# Patient Record
Sex: Female | Born: 1985 | Race: Black or African American | Hispanic: No | Marital: Single | Smoking: Current every day smoker
Health system: Southern US, Community
[De-identification: ages and names within clinical notes are randomized; demographics above are authoritative.]

## PROBLEM LIST (undated history)

## (undated) DIAGNOSIS — O24419 Gestational diabetes mellitus in pregnancy, unspecified control: Secondary | ICD-10-CM

## (undated) DIAGNOSIS — A539 Syphilis, unspecified: Secondary | ICD-10-CM

## (undated) DIAGNOSIS — N390 Urinary tract infection, site not specified: Secondary | ICD-10-CM

## (undated) DIAGNOSIS — J40 Bronchitis, not specified as acute or chronic: Secondary | ICD-10-CM

## (undated) DIAGNOSIS — E669 Obesity, unspecified: Secondary | ICD-10-CM

## (undated) DIAGNOSIS — A549 Gonococcal infection, unspecified: Secondary | ICD-10-CM

## (undated) DIAGNOSIS — I10 Essential (primary) hypertension: Secondary | ICD-10-CM

## (undated) HISTORY — DX: Morbid (severe) obesity due to excess calories: E66.01

## (undated) HISTORY — PX: TONSILLECTOMY: SUR1361

## (undated) HISTORY — DX: Bronchitis, not specified as acute or chronic: J40

---
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
## 2008-10-23 ENCOUNTER — Ambulatory Visit: Payer: Self-pay | Admitting: Gynecology

## 2008-11-01 ENCOUNTER — Ambulatory Visit: Payer: Self-pay | Admitting: Gynecology

## 2008-12-18 ENCOUNTER — Emergency Department (HOSPITAL_COMMUNITY): Admission: EM | Admit: 2008-12-18 | Discharge: 2008-12-18 | Payer: Self-pay | Admitting: Emergency Medicine

## 2009-09-10 ENCOUNTER — Ambulatory Visit: Payer: Self-pay | Admitting: Gynecology

## 2009-11-18 ENCOUNTER — Ambulatory Visit: Payer: Self-pay | Admitting: Gynecology

## 2010-01-22 ENCOUNTER — Ambulatory Visit: Payer: Self-pay | Admitting: Gynecology

## 2010-04-22 ENCOUNTER — Ambulatory Visit: Payer: Self-pay | Admitting: Gynecology

## 2010-04-22 ENCOUNTER — Other Ambulatory Visit: Admission: RE | Admit: 2010-04-22 | Discharge: 2010-04-22 | Payer: Self-pay | Admitting: Gynecology

## 2010-11-24 ENCOUNTER — Emergency Department (HOSPITAL_COMMUNITY)
Admission: EM | Admit: 2010-11-24 | Discharge: 2010-11-25 | Disposition: A | Discharge status: Home / Self Care | Payer: Private Health Insurance - Indemnity | Attending: Emergency Medicine | Admitting: Emergency Medicine

## 2010-11-24 DIAGNOSIS — R21 Rash and other nonspecific skin eruption: Secondary | ICD-10-CM | POA: Insufficient documentation

## 2011-01-01 ENCOUNTER — Ambulatory Visit (INDEPENDENT_AMBULATORY_CARE_PROVIDER_SITE_OTHER): Payer: Private Health Insurance - Indemnity | Admitting: Gynecology

## 2011-01-01 DIAGNOSIS — N926 Irregular menstruation, unspecified: Secondary | ICD-10-CM

## 2011-01-01 DIAGNOSIS — N9089 Other specified noninflammatory disorders of vulva and perineum: Secondary | ICD-10-CM

## 2011-01-01 DIAGNOSIS — Z113 Encounter for screening for infections with a predominantly sexual mode of transmission: Secondary | ICD-10-CM

## 2011-01-01 DIAGNOSIS — Z3041 Encounter for surveillance of contraceptive pills: Secondary | ICD-10-CM

## 2011-05-12 ENCOUNTER — Encounter: Payer: Private Health Insurance - Indemnity | Admitting: Gynecology

## 2011-09-24 ENCOUNTER — Encounter: Payer: Private Health Insurance - Indemnity | Admitting: Gynecology

## 2011-10-14 ENCOUNTER — Encounter: Payer: Private Health Insurance - Indemnity | Admitting: Gynecology

## 2011-10-22 ENCOUNTER — Telehealth: Payer: Self-pay

## 2011-10-22 ENCOUNTER — Ambulatory Visit (INDEPENDENT_AMBULATORY_CARE_PROVIDER_SITE_OTHER): Payer: Private Health Insurance - Indemnity | Admitting: Gynecology

## 2011-10-22 ENCOUNTER — Encounter: Payer: Self-pay | Admitting: Gynecology

## 2011-10-22 ENCOUNTER — Other Ambulatory Visit (HOSPITAL_COMMUNITY)
Admission: RE | Admit: 2011-10-22 | Discharge: 2011-10-22 | Disposition: A | Discharge status: Home / Self Care | Payer: Private Health Insurance - Indemnity | Source: Ambulatory Visit | Attending: Gynecology | Admitting: Gynecology

## 2011-10-22 VITALS — BP 118/80 | Ht 65.0 in | Wt 306.0 lb

## 2011-10-22 DIAGNOSIS — Z01419 Encounter for gynecological examination (general) (routine) without abnormal findings: Secondary | ICD-10-CM

## 2011-10-22 DIAGNOSIS — Z131 Encounter for screening for diabetes mellitus: Secondary | ICD-10-CM

## 2011-10-22 DIAGNOSIS — Z113 Encounter for screening for infections with a predominantly sexual mode of transmission: Secondary | ICD-10-CM

## 2011-10-22 DIAGNOSIS — L680 Hirsutism: Secondary | ICD-10-CM

## 2011-10-22 DIAGNOSIS — Z13220 Encounter for screening for lipoid disorders: Secondary | ICD-10-CM

## 2011-10-22 LAB — URINALYSIS W MICROSCOPIC + REFLEX CULTURE
Bilirubin Urine: NEGATIVE
Casts: NONE SEEN
Ketones, ur: NEGATIVE mg/dL
Nitrite: NEGATIVE
Urobilinogen, UA: 0.2 mg/dL (ref 0.0–1.0)

## 2011-10-22 MED ORDER — SULFAMETHOXAZOLE-TRIMETHOPRIM 800-160 MG PO TABS: 1.0000 | ORAL_TABLET | Freq: Two times a day (BID) | ORAL | Status: AC

## 2011-10-22 NOTE — Progress Notes (Signed)
Addended by: Dara Lords on: 10/22/2011 03:54 PM   Modules accepted: Orders

## 2011-10-22 NOTE — Progress Notes (Addendum)
Laura Gutierrez Feb 11, 1986 [id number]        26 y.o.  for annual exam.  Doing well. She's having regular menses and never started the birth control pills as we previously discussed. She does have a history of elevated testosterone and never repeated after starting birth control pills as we had discussed previously.  Past medical history,surgical history, medications, allergies, family history and social history were all reviewed and documented in the EPIC chart. ROS:  Was performed and pertinent positives and negatives are included in the history.  Exam: Laura Gutierrez chaperone present Filed Vitals:   10/22/11 1224  BP: 118/80   General appearance  Normal Skin grossly normal with sideburn and chin hair Head/Neck normal with no cervical or supraclavicular adenopathy thyroid normal Lungs  clear Cardiac RR, without RMG Abdominal  soft, nontender, without masses, organomegaly or hernia Breasts  examined lying and sitting without masses, retractions, discharge or axillary adenopathy. Pelvic  Ext/BUS/vagina  normal   Cervix  normal  Pap, GC Chlamydia screen  Uterus  grossly normal size, shape and contour, midline and mobile nontender exam limited by abdominal girth  Adnexa  Without masses or tenderness    Anus and perineum  normal   Rectovaginal  normal sphincter tone without palpated masses or tenderness.    Assessment/Plan:  26 y.o. female for annual exam.    1. History of elevated testosterone. She had androgens drawn 2010 were testosterone was 93 her DHEAS and 17 hydroxyprogesterone were normal. She had never started oral contraceptives as directed at that point and was having irregular menses. She returned and had a rechecked July 2011 and a return 142 but again had not started oral contraceptives was going to do so but then never did. She did have an ultrasound which was normal showing a normal uterus and bilateral ovaries visualized. She now has had regular menses this past year off of oral  contraceptives and is thinking about pregnancy and does not want to start oral contraceptives. She is on multivitamin with folic acid.  Will recheck a testosterone today. 2. STD screening. Patient ask for STD screening was no known exposure risk. GC Chlamydia RPR HIV hepatitis B hepatitis C ordered. 3. Pap smear. Pap smear was done today as she is only had one in her chart which was normal. 4. Breast health. SBE monthly reviewed. 5. Cigarette smoking. Patient is not ready to stop smoking. 6. Health maintenance. Will check baseline CBC lipid profile glucose urinalysis along with her other blood work.    Dara Lords MD, 12:43 PM 10/22/2011

## 2011-10-22 NOTE — Telephone Encounter (Signed)
Patient called and asked if she could get the Rx from today called in to [facility] instead of [facility].  [facility].  The [facility] was not answering and not sure if that is weather related. I will call them again on Monday to let them know she will no be picking it up. ka

## 2011-10-22 NOTE — Patient Instructions (Signed)
Follow up for blood work results. 

## 2011-10-23 LAB — LIPID PANEL
Cholesterol: 181 mg/dL (ref 0–200)
HDL: 41 mg/dL (ref >39)
Total CHOL/HDL Ratio: 4.4 Ratio

## 2011-10-23 LAB — CBC WITH DIFFERENTIAL/PLATELET
HCT: 32.8 % — ABNORMAL LOW (ref 36.0–46.0)
Lymphocytes Relative: 40 % (ref 12–46)
MCHC: 27.7 g/dL — ABNORMAL LOW (ref 30.0–36.0)
MCV: 77.9 fL — ABNORMAL LOW (ref 78.0–100.0)
Platelets: 408 10*3/uL — ABNORMAL HIGH (ref 150–400)
RDW: 20.3 % — ABNORMAL HIGH (ref 11.5–15.5)
WBC: 7.5 10*3/uL (ref 4.0–10.5)

## 2011-10-23 LAB — RPR

## 2011-10-23 LAB — GLUCOSE, RANDOM: Glucose, Bld: 94 mg/dL (ref 70–99)

## 2011-10-23 LAB — TESTOSTERONE: Testosterone: 142.34 ng/dL — ABNORMAL HIGH (ref 10–70)

## 2011-10-25 ENCOUNTER — Encounter: Payer: Self-pay | Admitting: Gynecology

## 2011-10-26 ENCOUNTER — Other Ambulatory Visit: Payer: Self-pay | Admitting: *Deleted

## 2011-10-26 DIAGNOSIS — D649 Anemia, unspecified: Secondary | ICD-10-CM

## 2011-10-26 MED ORDER — CEFIXIME 400 MG PO TABS: 400.0000 mg | ORAL_TABLET | Freq: Every day | ORAL | Status: AC

## 2012-08-05 ENCOUNTER — Encounter (HOSPITAL_COMMUNITY): Payer: Self-pay | Admitting: Family Medicine

## 2012-08-05 ENCOUNTER — Emergency Department (HOSPITAL_COMMUNITY)
Admission: EM | Admit: 2012-08-05 | Discharge: 2012-08-05 | Disposition: A | Discharge status: Home / Self Care | Payer: Self-pay | Attending: Emergency Medicine | Admitting: Emergency Medicine

## 2012-08-05 DIAGNOSIS — H109 Unspecified conjunctivitis: Secondary | ICD-10-CM | POA: Insufficient documentation

## 2012-08-05 DIAGNOSIS — F17200 Nicotine dependence, unspecified, uncomplicated: Secondary | ICD-10-CM | POA: Insufficient documentation

## 2012-08-05 DIAGNOSIS — Z8619 Personal history of other infectious and parasitic diseases: Secondary | ICD-10-CM | POA: Insufficient documentation

## 2012-08-05 MED ORDER — GENTAMICIN SULFATE 0.3 % OP SOLN: 1.0000 [drp] | Freq: Three times a day (TID) | OPHTHALMIC | Status: DC

## 2012-08-05 NOTE — ED Notes (Signed)
Pt states she awoke with green mucus in eye, crusty, and itchy this am. "I may have hit it last night after touching a doorknob and I'm seeing a sort of rainbow thing." NAD noted. Eye is mildly red around iris.

## 2012-08-05 NOTE — ED Notes (Signed)
Per pt left eye swelling, drainage, and itching since this am.

## 2012-08-05 NOTE — ED Provider Notes (Signed)
History  Scribed for Laura Shi, MD, the patient was seen in room TR02C/TR02C. This chart was scribed by Candelaria Stagers. The patient's care started at 6:38 PM   CSN: [id number]  Arrival date & time 08/05/12  1630   First MD Initiated Contact with Patient 08/05/12 1836      Chief Complaint  Patient presents with  . Eye Problem    The history is provided by the patient. No language interpreter was used.   Laura Gutierrez is a 26 y.o. female who presents to the Emergency Department complaining of left eye swelling and drainage that started this morning.  She states that the eye is also itching.  She has tried OTC eye drops with no relief.   Past Medical History  Diagnosis Date  . History of gonorrhea 11/2009&10/2011    + GC CULTURE    Past Surgical History  Procedure Date  . Tonsillectomy     AGE 26    Family History  Problem Relation Age of Onset  . Hypertension Mother   . Alzheimer's disease Maternal Aunt   . Heart disease Maternal Grandfather   . Diabetes Paternal Grandmother     History  Substance Use Topics  . Smoking status: Current Every Day Smoker -- 0.2 packs/day  . Smokeless tobacco: Never Used  . Alcohol Use: Yes     sometimes    OB History    Grav Para Term Preterm Abortions TAB SAB Ect Mult Living   0               Review of Systems All other systems reviewed and are negative Allergies  Review of patient's allergies indicates no known allergies.  Home Medications   Current Outpatient Rx  Name Route Sig Dispense Refill  . IRON PO Oral Take by mouth. PT TAKES OTC IRON.    . GENTAMICIN SULFATE 0.3 % OP SOLN Left Eye Place 1 drop into the left eye 3 (three) times daily. 5 mL 0    BP 159/91  Pulse 96  Temp 98 F (36.7 C)  Resp 18  SpO2 100%  LMP 07/07/2012  Physical Exam  Nursing note and vitals reviewed. Constitutional: She is oriented to person, place, and time. She appears well-developed and well-nourished. No distress.  HENT:    Head: Normocephalic and atraumatic.  Eyes: EOM are normal. Pupils are equal, round, and reactive to light. Left eye exhibits discharge. Left conjunctiva is injected.  Neck: Normal range of motion.  Pulmonary/Chest: No respiratory distress.  Abdominal: Normal appearance.  Musculoskeletal: Normal range of motion.  Neurological: She is alert and oriented to person, place, and time. No cranial nerve deficit.  Skin: Skin is warm and dry. No rash noted.  Psychiatric: She has a normal mood and affect. Her behavior is normal.     ED Course  Procedures   DIAGNOSTIC STUDIES:   COORDINATION OF CARE:     Labs Reviewed - No data to display No results found.   1. Conjunctivitis       MDM  I personally performed the services described in this documentation, which was scribed in my presence. The recorded information has been reviewed and considered.       Laura Shi, MD 08/05/12 [phone]

## 2012-10-24 ENCOUNTER — Encounter: Payer: Private Health Insurance - Indemnity | Admitting: Gynecology

## 2012-11-29 ENCOUNTER — Encounter: Payer: Private Health Insurance - Indemnity | Admitting: Gynecology

## 2012-12-08 ENCOUNTER — Encounter: Payer: Private Health Insurance - Indemnity | Admitting: Gynecology

## 2013-02-12 ENCOUNTER — Inpatient Hospital Stay (HOSPITAL_COMMUNITY)
Admission: AD | Admit: 2013-02-12 | Discharge: 2013-02-12 | Disposition: A | Discharge status: Home / Self Care | Payer: BC Managed Care – PPO | Source: Ambulatory Visit | Attending: Gynecology | Admitting: Gynecology

## 2013-02-12 ENCOUNTER — Encounter (HOSPITAL_COMMUNITY): Payer: Self-pay

## 2013-02-12 DIAGNOSIS — F17200 Nicotine dependence, unspecified, uncomplicated: Secondary | ICD-10-CM | POA: Insufficient documentation

## 2013-02-12 DIAGNOSIS — L0233 Carbuncle of buttock: Secondary | ICD-10-CM | POA: Insufficient documentation

## 2013-02-12 DIAGNOSIS — L0232 Furuncle of buttock: Secondary | ICD-10-CM

## 2013-02-12 DIAGNOSIS — N920 Excessive and frequent menstruation with regular cycle: Secondary | ICD-10-CM

## 2013-02-12 DIAGNOSIS — D509 Iron deficiency anemia, unspecified: Secondary | ICD-10-CM | POA: Insufficient documentation

## 2013-02-12 DIAGNOSIS — R5381 Other malaise: Secondary | ICD-10-CM | POA: Insufficient documentation

## 2013-02-12 LAB — URINE MICROSCOPIC-ADD ON

## 2013-02-12 LAB — CBC
HCT: 27.7 % — ABNORMAL LOW (ref 36.0–46.0)
MCH: 26.2 pg (ref 26.0–34.0)
MCHC: 30.7 g/dL (ref 30.0–36.0)
RDW: 17.1 % — ABNORMAL HIGH (ref 11.5–15.5)

## 2013-02-12 LAB — URINALYSIS, ROUTINE W REFLEX MICROSCOPIC
Bilirubin Urine: NEGATIVE
Ketones, ur: NEGATIVE mg/dL
Leukocytes, UA: NEGATIVE
Nitrite: NEGATIVE
Protein, ur: NEGATIVE mg/dL

## 2013-02-12 LAB — WET PREP, GENITAL
Clue Cells Wet Prep HPF POC: NONE SEEN
Yeast Wet Prep HPF POC: NONE SEEN

## 2013-02-12 LAB — POCT PREGNANCY, URINE: Preg Test, Ur: NEGATIVE

## 2013-02-12 MED ORDER — FERROUS SULFATE 325 (65 FE) MG PO TABS: 325.0000 mg | ORAL_TABLET | Freq: Two times a day (BID) | ORAL | Status: DC

## 2013-02-12 MED ORDER — SULFAMETHOXAZOLE-TRIMETHOPRIM 800-160 MG PO TABS: 1.0000 | ORAL_TABLET | Freq: Two times a day (BID) | ORAL | Status: DC

## 2013-02-12 NOTE — MAU Note (Signed)
Patient is in with c/o vaginal bleeding since may 1st. She states that it is not heavy evry day, it actual stopped last night and restarted this morning. Patient states that she had normal lmp 01/10/13. She c/o feeling weak and boil near her rectum. Patient states that she had boils in the past that go away on its own but this current boil is painful.

## 2013-02-12 NOTE — MAU Provider Note (Signed)
CC: Vaginal Bleeding, Fatigue and Recurrent Skin Infections    First Provider Initiated Contact with Patient 02/12/13 0950      HPI Laura Gutierrez is a 27 y.o. G0P0 who presents with onset of vaginal bleeding since 02/01/2013 through present. She says the amount waxes and wanes and is sometimes heavy. It seemed to stop for the first time last night but has recurred this morning. She feels weak but not orthostatic. She has ice pica and has been on iron for anemia  (hgb 9.1 10/2011). Took 1 iron tab yesterday.  Previous menstrual period 01/10/2013 was normal. Reports regular monthly menses prior to this episode. States she has had a pelvic ultrasound years ago and has no known fibroids, PCOS or other abnormalities. Had elevated testosterone last year. No contraception.  She's also concerned that she has a painful boil near her rectum. It spontaneously drained yellow fluid.   Past Medical History  Diagnosis Date  . History of gonorrhea 11/2009&10/2011    + GC CULTURE    OB History   Grav Para Term Preterm Abortions TAB SAB Ect Mult Living   0               Past Surgical History  Procedure Laterality Date  . Tonsillectomy      AGE 73    History   Social History  . Marital Status: Single    Spouse Name: N/A    Number of Children: N/A  . Years of Education: N/A   Occupational History  . Not on file.   Social History Main Topics  . Smoking status: Current Every Day Smoker -- 0.25 packs/day    Types: Cigarettes  . Smokeless tobacco: Never Used  . Alcohol Use: Yes     Comment: sometimes  . Drug Use: No  . Sexually Active: Yes    Birth Control/ Protection: Condom   Other Topics Concern  . Not on file   Social History Narrative  . No narrative on file    No current facility-administered medications on file prior to encounter.   Current Outpatient Prescriptions on File Prior to Encounter  Medication Sig Dispense Refill  . gentamicin (GARAMYCIN) 0.3 % ophthalmic solution  Place 1 drop into the left eye 3 (three) times daily.  5 mL  0  . IRON PO Take by mouth. PT TAKES OTC IRON.        No Known Allergies  ROS Pertinent items in HPI  PHYSICAL EXAM Filed Vitals:   02/12/13 0946  BP: 137/89  Pulse: 98  Temp: 97.8 F (36.6 C)  Resp: 18   Filed Vitals:   02/12/13 1007 02/12/13 1112 02/12/13 1114 02/12/13 1116  BP:  125/74 130/75 128/84  Pulse:  81 80 94  Temp:      TempSrc:      Resp:  16 18 18   Height: 5\' 5"  (1.651 m)     Weight: 304 lb 2 oz (137.95 kg)     SpO2:        General: Well nourished, well developed female in no acute distress Cardiovascular: Normal rate Respiratory: Normal effort Abdomen: Soft, nontender Back: No CVAT Extremities: No edema Neurologic: Alert and oriented Speculum exam: NEFG; vagina with physiologic discharge, scant amount blood; cervix nulliparous,clean Bimanual exam: cervix smooth, no CMT; uterus NSSP; no adnexal tenderness or masses   LAB RESULTS Results for orders placed during the hospital encounter of 02/12/13 (from the past 24 hour(s))  URINALYSIS, ROUTINE W REFLEX MICROSCOPIC  Status: Abnormal   Collection Time    02/12/13  9:28 AM      Result Value Range   Color, Urine YELLOW  YELLOW   APPearance CLOUDY (*) CLEAR   Specific Gravity, Urine 1.025  1.005 - 1.030   pH 6.0  5.0 - 8.0   Glucose, UA NEGATIVE  NEGATIVE mg/dL   Hgb urine dipstick LARGE (*) NEGATIVE   Bilirubin Urine NEGATIVE  NEGATIVE   Ketones, ur NEGATIVE  NEGATIVE mg/dL   Protein, ur NEGATIVE  NEGATIVE mg/dL   Urobilinogen, UA 0.2  0.0 - 1.0 mg/dL   Nitrite NEGATIVE  NEGATIVE   Leukocytes, UA NEGATIVE  NEGATIVE  URINE MICROSCOPIC-ADD ON     Status: None   Collection Time    02/12/13  9:28 AM      Result Value Range   Squamous Epithelial / LPF RARE  RARE   WBC, UA 0-2  <3 WBC/hpf   RBC / HPF 21-50  <3 RBC/hpf   Bacteria, UA RARE  RARE  POCT PREGNANCY, URINE     Status: None   Collection Time    02/12/13  9:54 AM       Result Value Range   Preg Test, Ur NEGATIVE  NEGATIVE  CBC     Status: Abnormal   Collection Time    02/12/13  9:57 AM      Result Value Range   WBC 14.9 (*) 4.0 - 10.5 K/uL   RBC 3.25 (*) 3.87 - 5.11 MIL/uL   Hemoglobin 8.5 (*) 12.0 - 15.0 g/dL   HCT 91.4 (*) 78.2 - 95.6 %   MCV 85.2  78.0 - 100.0 fL   MCH 26.2  26.0 - 34.0 pg   MCHC 30.7  30.0 - 36.0 g/dL   RDW 21.3 (*) 08.6 - 57.8 %   Platelets 367  150 - 400 K/uL  WET PREP, GENITAL     Status: Abnormal   Collection Time    02/12/13 10:57 AM      Result Value Range   Yeast Wet Prep HPF POC NONE SEEN  NONE SEEN   Trich, Wet Prep FEW (*) NONE SEEN   Clue Cells Wet Prep HPF POC NONE SEEN  NONE SEEN   WBC, Wet Prep HPF POC FEW (*) NONE SEEN    IMAGING No results found.  MAU COURSE GC/CT sent C/W TC  Dr. Marcell Barlow PMD C/W Dr. Neita Carp soaks and abx for furuncle, iron supplementation  F/U  ASSESSMENT  1. Menorrhagia   2. Active smoker   3. Morbid obesity   4. Iron deficiency anemia   5. Boil of buttock     PLAN Discharge home. See AVS for patient education.    Medication List    TAKE these medications       ferrous sulfate 325 (65 FE) MG tablet  Commonly known as:  FERROUSUL  Take 1 tablet (325 mg total) by mouth 2 (two) times daily.     sulfamethoxazole-trimethoprim 800-160 MG per tablet  Commonly known as:  BACTRIM DS  Take 1 tablet by mouth 2 (two) times daily.       Follow-up Information   Schedule an appointment as soon as possible for a visit with Dara Lords, MD.   Contact information:   [address] [place] [address] [phone]      Office F/U [facility] with few trich, asymptomatic  Danae Orleans, CNM 02/12/2013 9:54 AM

## 2013-02-13 LAB — GC/CHLAMYDIA PROBE AMP: CT Probe RNA: NEGATIVE

## 2013-03-18 ENCOUNTER — Encounter (HOSPITAL_COMMUNITY): Payer: Self-pay | Admitting: Emergency Medicine

## 2013-03-18 ENCOUNTER — Emergency Department (HOSPITAL_COMMUNITY)
Admission: EM | Admit: 2013-03-18 | Discharge: 2013-03-18 | Disposition: A | Discharge status: Home / Self Care | Payer: BC Managed Care – PPO | Attending: Emergency Medicine | Admitting: Emergency Medicine

## 2013-03-18 DIAGNOSIS — Z8619 Personal history of other infectious and parasitic diseases: Secondary | ICD-10-CM | POA: Insufficient documentation

## 2013-03-18 DIAGNOSIS — T783XXA Angioneurotic edema, initial encounter: Secondary | ICD-10-CM

## 2013-03-18 DIAGNOSIS — T4995XA Adverse effect of unspecified topical agent, initial encounter: Secondary | ICD-10-CM | POA: Insufficient documentation

## 2013-03-18 DIAGNOSIS — Z79899 Other long term (current) drug therapy: Secondary | ICD-10-CM | POA: Insufficient documentation

## 2013-03-18 DIAGNOSIS — F17200 Nicotine dependence, unspecified, uncomplicated: Secondary | ICD-10-CM | POA: Insufficient documentation

## 2013-03-18 DIAGNOSIS — E669 Obesity, unspecified: Secondary | ICD-10-CM | POA: Insufficient documentation

## 2013-03-18 HISTORY — DX: Obesity, unspecified: E66.9

## 2013-03-18 MED ORDER — DIPHENHYDRAMINE HCL 25 MG PO CAPS: 25.0000 mg | ORAL_CAPSULE | Freq: Four times a day (QID) | ORAL | Status: DC | PRN

## 2013-03-18 MED ORDER — DIPHENHYDRAMINE HCL 25 MG PO CAPS
25.0000 mg | ORAL_CAPSULE | Freq: Once | ORAL | Status: AC
  Administered 2013-03-18: 25 mg via ORAL
  Filled 2013-03-18: qty 1

## 2013-03-18 MED ORDER — PREDNISONE 20 MG PO TABS
60.0000 mg | ORAL_TABLET | Freq: Once | ORAL | Status: AC
  Administered 2013-03-18: 60 mg via ORAL
  Filled 2013-03-18: qty 3

## 2013-03-18 MED ORDER — FAMOTIDINE 20 MG PO TABS
20.0000 mg | ORAL_TABLET | Freq: Once | ORAL | Status: AC
  Administered 2013-03-18: 20 mg via ORAL
  Filled 2013-03-18: qty 1

## 2013-03-18 MED ORDER — FAMOTIDINE 20 MG PO TABS: 20.0000 mg | ORAL_TABLET | Freq: Two times a day (BID) | ORAL | Status: DC

## 2013-03-18 NOTE — ED Notes (Signed)
Pt dc to home. Pt sts understanding to dc instructions. Pt ambulatory to exit without difficulty. 

## 2013-03-18 NOTE — ED Provider Notes (Signed)
History     CSN: [id number]  Arrival date & time 03/18/13  7829   First MD Initiated Contact with Patient 03/18/13 0602      Chief Complaint  Patient presents with  . Allergic Reaction    (Consider location/radiation/quality/duration/timing/severity/associated sxs/prior treatment) HPI Laura Gutierrez is a 27 y.o. female who presents to ED with complaint of lower lip swelling. States woke up in the middle of the night to use the bathroom and felt like her lip was not feeling "right." States when looked int he mirror it was swollen. States she was started on bactrim 2 days ago for an abscess which she states is healed now. States she took a dose of it just prior to going to bed. Pt denies taking any other medications. Not on any blood pressure meds. Denies any new products, facial lotions, soap, detergent. Denies taking any medications prior to coming in.   Past Medical History  Diagnosis Date  . History of gonorrhea 11/2009&10/2011    + GC CULTURE  . Obesity     Past Surgical History  Procedure Laterality Date  . Tonsillectomy      AGE 89    Family History  Problem Relation Age of Onset  . Hypertension Mother   . Alzheimer's disease Maternal Aunt   . Heart disease Maternal Grandfather   . Diabetes Paternal Grandmother     History  Substance Use Topics  . Smoking status: Current Every Day Smoker -- 0.25 packs/day    Types: Cigarettes  . Smokeless tobacco: Never Used  . Alcohol Use: Yes     Comment: sometimes    OB History   Grav Para Term Preterm Abortions TAB SAB Ect Mult Living   0               Review of Systems  HENT: Positive for facial swelling.   Respiratory: Negative.   Cardiovascular: Negative.   All other systems reviewed and are negative.    Allergies  Review of patient's allergies indicates no known allergies.  Home Medications   Current Outpatient Rx  Name  Route  Sig  Dispense  Refill  . sulfamethoxazole-trimethoprim (BACTRIM DS) 800-160  MG per tablet   Oral   Take 1 tablet by mouth 2 (two) times daily.   20 tablet   0   . ferrous sulfate (FERROUSUL) 325 (65 FE) MG tablet   Oral   Take 1 tablet (325 mg total) by mouth 2 (two) times daily.   60 tablet   1     BP 159/103  Pulse 98  Temp(Src) 98.1 F (36.7 C) (Oral)  Resp 14  SpO2 100%  LMP 03/09/2013  Physical Exam  Nursing note and vitals reviewed. Constitutional: She appears well-developed and well-nourished. No distress.  HENT:  No lip swelling. No tongue swelling. Uvula normal, midline.   Eyes: Conjunctivae are normal.  Neck: Neck supple.  Cardiovascular: Normal rate, regular rhythm and normal heart sounds.   Pulmonary/Chest: Effort normal and breath sounds normal. No respiratory distress. She has no wheezes. She has no rales.  Musculoskeletal: She exhibits no edema.  Neurological: She is alert.  Skin: Skin is warm and dry.    ED Course  Procedures (including critical care time)  No results found.   1. Allergic angioedema, initial encounter       MDM  Pt with lower lip swelling which now resolved. No tongue, uvula, throat swelling. No rash. Not on ACE inhibitors. Recently started on bactrim. Instructed to  stop. Home with pepcid and benadryl. Return if worsening.   Medications  diphenhydrAMINE (BENADRYL) capsule 25 mg (25 mg Oral Given 03/18/13 0624)  predniSONE (DELTASONE) tablet 60 mg (60 mg Oral Given 03/18/13 0624)  famotidine (PEPCID) tablet 20 mg (20 mg Oral Given 03/18/13 0624)   Filed Vitals:   03/18/13 0321 03/18/13 0641  BP: 159/103 140/87  Pulse: 98 74  Temp: 98.1 F (36.7 C)   TempSrc: Oral   Resp: 14 20  SpO2: 100% 98%           Lottie Mussel, PA-C 03/18/13 1643

## 2013-03-18 NOTE — ED Notes (Signed)
PT. WOKE UP THIS MORNING WITH LOWER LIP SWELLING , PT. IS CURRENTLY TAKING BACTRIM DS ANTIBIOTIC FOR ABSCESS ( 3RD DAY) , RESPIRATIONS UNLABORED / AIRWAY INTACT.

## 2013-03-18 NOTE — ED Provider Notes (Signed)
Medical screening examination/treatment/procedure(s) were performed by non-physician practitioner and as supervising physician I was immediately available for consultation/collaboration.  Sunnie Nielsen, MD 03/18/13 2129

## 2013-12-16 ENCOUNTER — Emergency Department (HOSPITAL_COMMUNITY): Payer: Private Health Insurance - Indemnity

## 2013-12-16 ENCOUNTER — Emergency Department (HOSPITAL_COMMUNITY)
Admission: EM | Admit: 2013-12-16 | Discharge: 2013-12-16 | Disposition: A | Discharge status: Home / Self Care | Payer: Private Health Insurance - Indemnity | Attending: Emergency Medicine | Admitting: Emergency Medicine

## 2013-12-16 ENCOUNTER — Encounter (HOSPITAL_COMMUNITY): Payer: Self-pay | Admitting: Emergency Medicine

## 2013-12-16 DIAGNOSIS — F17200 Nicotine dependence, unspecified, uncomplicated: Secondary | ICD-10-CM | POA: Insufficient documentation

## 2013-12-16 DIAGNOSIS — L03019 Cellulitis of unspecified finger: Secondary | ICD-10-CM | POA: Insufficient documentation

## 2013-12-16 MED ORDER — CEPHALEXIN 500 MG PO CAPS: 500.0000 mg | ORAL_CAPSULE | Freq: Four times a day (QID) | ORAL | Status: DC

## 2013-12-16 MED ORDER — TETANUS-DIPHTH-ACELL PERTUSSIS 5-2.5-18.5 LF-MCG/0.5 IM SUSP
0.5000 mL | Freq: Once | INTRAMUSCULAR | Status: AC
  Administered 2013-12-16: 0.5 mL via INTRAMUSCULAR
  Filled 2013-12-16: qty 0.5
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
## 2013-12-16 NOTE — ED Provider Notes (Signed)
Medical screening examination/treatment/procedure(s) were performed by non-physician practitioner and as supervising physician I was immediately available for consultation/collaboration.   EKG Interpretation None        Richardean Canalavid H Alizaya Oshea, MD 12/16/13 2108
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
## 2013-12-16 NOTE — Discharge Instructions (Signed)
Paronychia Paronychia is an inflammatory reaction involving the folds of the skin surrounding the fingernail. This is commonly caused by an infection in the skin around a nail. The most common cause of paronychia is frequent wetting of the hands (as seen with bartenders, food servers, nurses or others who wet their hands). This makes the skin around the fingernail susceptible to infection by bacteria (germs) or fungus. Other predisposing factors are:  Aggressive manicuring.  Nail biting.  Thumb sucking. The most common cause is a staphylococcal (a type of germ) infection, or a fungal (Candida) infection. When caused by a germ, it usually comes on suddenly with redness, swelling, pus and is often painful. It may get under the nail and form an abscess (collection of pus), or form an abscess around the nail. If the nail itself is infected with a fungus, the treatment is usually prolonged and may require oral medicine for up to one year. Your caregiver will determine the length of time treatment is required. The paronychia caused by bacteria (germs) may largely be avoided by not pulling on hangnails or picking at cuticles. When the infection occurs at the tips of the finger it is called felon. When the cause of paronychia is from the herpes simplex virus (HSV) it is called herpetic whitlow. TREATMENT  When an abscess is present treatment is often incision and drainage. This means that the abscess must be cut open so the pus can get out. When this is done, the following home care instructions should be followed. HOME CARE INSTRUCTIONS   It is important to keep the affected fingers very dry. Rubber or plastic gloves over cotton gloves should be used whenever the hand must be placed in water.  Keep wound clean, dry and dressed as suggested by your caregiver between warm soaks or warm compresses.  Soak in warm water for fifteen to twenty minutes three to four times per day for bacterial infections. Fungal  infections are very difficult to treat, so often require treatment for long periods of time.  For bacterial (germ) infections take antibiotics (medicine which kill germs) as directed and finish the prescription, even if the problem appears to be solved before the medicine is gone.  Only take over-the-counter or prescription medicines for pain, discomfort, or fever as directed by your caregiver. SEEK IMMEDIATE MEDICAL CARE IF:  You have redness, swelling, or increasing pain in the wound.  You notice pus coming from the wound.  You have a fever.  You notice a bad smell coming from the wound or dressing. Document Released: 03/16/2001 Document Revised: 12/13/2011 Document Reviewed: 11/15/2008 [facility] Patient Information 2014 [place], [place].  Fingertip Infection When an infection is around the nail, it is called a paronychia. When it appears over the tip of the finger, it is called a felon. These infections are due to minor injuries or cracks in the skin. If they are not treated properly, they can lead to bone infection and permanent damage to the fingernail. Incision and drainage is necessary if a pus pocket (an abscess) has formed. Antibiotics and pain medicine may also be needed. Keep your hand elevated for the next 2-3 days to reduce swelling and pain. If a pack was placed in the abscess, it should be removed in 1-2 days by your caregiver. Soak the finger in warm water for 20 minutes 4 times daily to help promote drainage. Keep the hands as dry as possible. Wear protective gloves with cotton liners. See your caregiver for follow-up care as recommended.  HOME CARE INSTRUCTIONS   Keep wound clean, dry and dressed as suggested by your caregiver.  Soak in warm salt water for fifteen minutes, four times per day for bacterial infections.  Your caregiver will prescribe an antibiotic if a bacterial infection is suspected. Take antibiotics as directed and finish the prescription, even if the  problem appears to be improving before the medicine is gone.  Only take over-the-counter or prescription medicines for pain, discomfort, or fever as directed by your caregiver. SEEK IMMEDIATE MEDICAL CARE IF:  There is redness, swelling, or increasing pain in the wound.  Pus or any other unusual drainage is coming from the wound.  An unexplained oral temperature above 102 F (38.9 C) develops.  You notice a foul smell coming from the wound or dressing. MAKE SURE YOU:   Understand these instructions.  Monitor your condition.  Contact your caregiver if you are getting worse or not improving. Document Released: 10/28/2004 Document Revised: 12/13/2011 Document Reviewed: 10/24/2008 [facility] Patient Information 2014 [place], [place].

## 2014-12-13 ENCOUNTER — Ambulatory Visit: Payer: Self-pay | Admitting: Gynecology

## 2014-12-16 ENCOUNTER — Ambulatory Visit (INDEPENDENT_AMBULATORY_CARE_PROVIDER_SITE_OTHER): Payer: PRIVATE HEALTH INSURANCE | Admitting: Gynecology

## 2014-12-16 ENCOUNTER — Encounter: Payer: Self-pay | Admitting: Gynecology

## 2014-12-16 ENCOUNTER — Other Ambulatory Visit (HOSPITAL_COMMUNITY)
Admission: RE | Admit: 2014-12-16 | Discharge: 2014-12-16 | Disposition: A | Discharge status: Home / Self Care | Payer: Private Health Insurance - Indemnity | Source: Ambulatory Visit | Attending: Gynecology | Admitting: Gynecology

## 2014-12-16 VITALS — BP 142/84 | Ht 65.5 in | Wt 294.0 lb

## 2014-12-16 DIAGNOSIS — Z113 Encounter for screening for infections with a predominantly sexual mode of transmission: Secondary | ICD-10-CM

## 2014-12-16 DIAGNOSIS — E349 Endocrine disorder, unspecified: Secondary | ICD-10-CM

## 2014-12-16 DIAGNOSIS — Z01419 Encounter for gynecological examination (general) (routine) without abnormal findings: Secondary | ICD-10-CM

## 2014-12-16 DIAGNOSIS — R7989 Other specified abnormal findings of blood chemistry: Secondary | ICD-10-CM

## 2014-12-16 LAB — CBC WITH DIFFERENTIAL/PLATELET
BASOS ABS: 0.1 10*3/uL (ref 0.0–0.1)
BASOS PCT: 1 % (ref 0–1)
EOS ABS: 0.3 10*3/uL (ref 0.0–0.7)
Eosinophils Relative: 3 % (ref 0–5)
HCT: 30.2 % — ABNORMAL LOW (ref 36.0–46.0)
Hemoglobin: 8.4 g/dL — ABNORMAL LOW (ref 12.0–15.0)
Lymphocytes Relative: 36 % (ref 12–46)
Lymphs Abs: 3.1 10*3/uL (ref 0.7–4.0)
MCH: 20.2 pg — ABNORMAL LOW (ref 26.0–34.0)
MCHC: 27.8 g/dL — AB (ref 30.0–36.0)
MCV: 72.8 fL — AB (ref 78.0–100.0)
MPV: 11 fL (ref 8.6–12.4)
Monocytes Absolute: 1 10*3/uL (ref 0.1–1.0)
Monocytes Relative: 11 % (ref 3–12)
NEUTROS ABS: 4.3 10*3/uL (ref 1.7–7.7)
Neutrophils Relative %: 49 % (ref 43–77)
PLATELETS: 367 10*3/uL (ref 150–400)
RBC: 4.15 MIL/uL (ref 3.87–5.11)
RDW: 18.1 % — AB (ref 11.5–15.5)
WBC: 8.7 10*3/uL (ref 4.0–10.5)

## 2014-12-16 NOTE — Addendum Note (Signed)
Addended by: Dayna BarkerGARDNER, KIMBERLY K on: 12/16/2014 11:41 AM   Modules accepted: Orders

## 2014-12-16 NOTE — Progress Notes (Signed)
Laura Gutierrez 02/15/1986 [id number]        29 y.o.  G0P0 for annual exam.  Has not been in for over 3 years. Several issues noted below.  Past medical history,surgical history, problem list, medications, allergies, family history and social history were all reviewed and documented as reviewed in the EPIC chart.  ROS:  Performed with pertinent positives and negatives included in the history, assessment and plan.   Additional significant findings :  none   Exam: [facility] person[facility]istant Filed Vitals:   12/16/14 1050  BP: 142/84  Height: 5' 5.5" (1.664 m)  Weight: 294 lb (133.358 kg)   General appearance:  Normal affect, orientation and appearance. Skin: Grossly normal with mild chin hairs. HEENT: Without gross lesions.  No cervical or supraclavicular adenopathy. Thyroid normal.  Lungs:  Clear without wheezing, rales or rhonchi Cardiac: RR, without RMG Abdominal:  Soft, nontender, without masses, guarding, rebound, organomegaly or hernia Breasts:  Examined lying and sitting without masses, retractions, discharge or axillary adenopathy. Pelvic:  Ext/BUS/vagina normal  Cervix normal. GC/Chlamydia area Pap smear  Uterus difficult to palpate but grossly normal size, nontender   Adnexa  Without gross masses or tenderness    Anus and perineum  Normal    Assessment/Plan:  29 y.o. G0P0 female for annual exam with regular menses, no birth control.   1. History of elevated testosterone. 2010 testosterone was 93. DHEAS and 17 hydroxy progesterone normal. Follow up testosterone 2011 142. The patient was to start oral contraceptives but never did. Not having reportedly significant hirsutism. Having regular menses.  Not interested in starting oral contraceptives now. Recheck testosterone DHEAS and 17 hydroxyprogesterone.  Check baseline ultrasound for pelvic surveillance. 2. Elevated blood pressure.  Blood pressure 142/84. Review of her chart from ER visits that it has also been elevated. Strongly recommended  patient follow up with primary care physician for monitoring and treatment as needed. We will help facilitate this appointment. No routine blood work done as we will let them decide and do what they feel appropriate. 3. Birth control. Patient not using birth control. Danger of getting pregnant with uncontrolled elevated blood pressure reviewed. Strongly recommended patient use contraception now. Offered options to include combination, progesterone only, IUD. At this point do not feel starting the pill appropriate until her blood pressures controlled. Patient does not want to start anything regardless. She understands the risks of pregnancy practically with her elevated blood pressure. 4. History of GC in 2011 and 2013. GC/Chlamydia done today. Serum screening with hepatitis B, hepatitis C, RPR and HIV done. 5. Pap smear 2013 normal. Pap smear done today a 3 year interval per current screening guidelines. 6. Breast health. SBE monthly reviewed. 7. Stop smoking and encouraged. 8. Obesity. Will check baseline ultrasound for pelvic surveillance. 9. Health maintenance. No routine blood work done as she will have this done at her primary physician's office when establishing care. I did check a baseline CBC. She is known to be anemic in the past and never followed up to take iron as recommended. I again encouraged her to start taking daily iron supplements. Follow up for ultrasound and lab work results.     Dara LordsFONTAINE,Khayla Koppenhaver P MD, 11:25 AM 12/16/2014

## 2014-12-16 NOTE — Patient Instructions (Signed)
Follow up for lab results and ultrasound as scheduled.  Office will call you to help arrange internal medicine follow up appointment  You may obtain a copy of any labs that were done today by logging onto MyChart as outlined in the instructions provided with your AVS (after visit summary). The office will not call with normal lab results but certainly if there are any significant abnormalities then we will contact you.   Health Maintenance, Female A healthy lifestyle and preventative care can promote health and wellness.  Maintain regular health, dental, and eye exams.  Eat a healthy diet. Foods like vegetables, fruits, whole grains, low-fat dairy products, and lean protein foods contain the nutrients you need without too many calories. Decrease your intake of foods high in solid fats, added sugars, and salt. Get information about a proper diet from your caregiver, if necessary.  Regular physical exercise is one of the most important things you can do for your health. Most adults should get at least 150 minutes of moderate-intensity exercise (any activity that increases your heart rate and causes you to sweat) each week. In addition, most adults need muscle-strengthening exercises on 2 or more days a week.   Maintain a healthy weight. The body mass index (BMI) is a screening tool to identify possible weight problems. It provides an estimate of body fat based on height and weight. Your caregiver can help determine your BMI, and can help you achieve or maintain a healthy weight. For adults 20 years and older:  A BMI below 18.5 is considered underweight.  A BMI of 18.5 to 24.9 is normal.  A BMI of 25 to 29.9 is considered overweight.  A BMI of 30 and above is considered obese.  Maintain normal blood lipids and cholesterol by exercising and minimizing your intake of saturated fat. Eat a balanced diet with plenty of fruits and vegetables. Blood tests for lipids and cholesterol should begin at  age 68 and be repeated every 5 years. If your lipid or cholesterol levels are high, you are over 50, or you are a high risk for heart disease, you may need your cholesterol levels checked more frequently.Ongoing high lipid and cholesterol levels should be treated with medicines if diet and exercise are not effective.  If you smoke, find out from your caregiver how to quit. If you do not use tobacco, do not start.  Lung cancer screening is recommended for adults aged 65 80 years who are at high risk for developing lung cancer because of a history of smoking. Yearly low-dose computed tomography (CT) is recommended for people who have at least a 30-pack-year history of smoking and are a current smoker or have quit within the past 15 years. A pack year of smoking is smoking an average of 1 pack of cigarettes a day for 1 year (for example: 1 pack a day for 30 years or 2 packs a day for 15 years). Yearly screening should continue until the smoker has stopped smoking for at least 15 years. Yearly screening should also be stopped for people who develop a health problem that would prevent them from having lung cancer treatment.  If you are pregnant, do not drink alcohol. If you are breastfeeding, be very cautious about drinking alcohol. If you are not pregnant and choose to drink alcohol, do not exceed 1 drink per day. One drink is considered to be 12 ounces (355 mL) of beer, 5 ounces (148 mL) of wine, or 1.5 ounces (44 mL) of liquor.  Avoid use of street drugs. Do not share needles with anyone. Ask for help if you need support or instructions about stopping the use of drugs.  High blood pressure causes heart disease and increases the risk of stroke. Blood pressure should be checked at least every 1 to 2 years. Ongoing high blood pressure should be treated with medicines, if weight loss and exercise are not effective.  If you are 36 to 27 years old, ask your caregiver if you should take aspirin to prevent  strokes.  Diabetes screening involves taking a blood sample to check your fasting blood sugar level. This should be done once every 3 years, after age 30, if you are within normal weight and without risk factors for diabetes. Testing should be considered at a younger age or be carried out more frequently if you are overweight and have at least 1 risk factor for diabetes.  Breast cancer screening is essential preventative care for women. You should practice "breast self-awareness." This means understanding the normal appearance and feel of your breasts and may include breast self-examination. Any changes detected, no matter how small, should be reported to a caregiver. Women in their 63s and 30s should have a clinical breast exam (CBE) by a caregiver as part of a regular health exam every 1 to 3 years. After age 46, women should have a CBE every year. Starting at age 41, women should consider having a mammogram (breast X-ray) every year. Women who have a family history of breast cancer should talk to their caregiver about genetic screening. Women at a high risk of breast cancer should talk to their caregiver about having an MRI and a mammogram every year.  Breast cancer gene (BRCA)-related cancer risk assessment is recommended for women who have family members with BRCA-related cancers. BRCA-related cancers include breast, ovarian, tubal, and peritoneal cancers. Having family members with these cancers may be associated with an increased risk for harmful changes (mutations) in the breast cancer genes BRCA1 and BRCA2. Results of the assessment will determine the need for genetic counseling and BRCA1 and BRCA2 testing.  The Pap test is a screening test for cervical cancer. Women should have a Pap test starting at age 64. Between ages 70 and 95, Pap tests should be repeated every 2 years. Beginning at age 71, you should have a Pap test every 3 years as long as the past 3 Pap tests have been normal. If you had a  hysterectomy for a problem that was not cancer or a condition that could lead to cancer, then you no longer need Pap tests. If you are between ages 56 and 49, and you have had normal Pap tests going back 10 years, you no longer need Pap tests. If you have had past treatment for cervical cancer or a condition that could lead to cancer, you need Pap tests and screening for cancer for at least 20 years after your treatment. If Pap tests have been discontinued, risk factors (such as a new sexual partner) need to be reassessed to determine if screening should be resumed. Some women have medical problems that increase the chance of getting cervical cancer. In these cases, your caregiver may recommend more frequent screening and Pap tests.  The human papillomavirus (HPV) test is an additional test that may be used for cervical cancer screening. The HPV test looks for the virus that can cause the cell changes on the cervix. The cells collected during the Pap test can be tested for HPV. The  HPV test could be used to screen women aged 60 years and older, and should be used in women of any age who have unclear Pap test results. After the age of 62, women should have HPV testing at the same frequency as a Pap test.  Colorectal cancer can be detected and often prevented. Most routine colorectal cancer screening begins at the age of 55 and continues through age 35. However, your caregiver may recommend screening at an earlier age if you have risk factors for colon cancer. On a yearly basis, your caregiver may provide home test kits to check for hidden blood in the stool. Use of a small camera at the end of a tube, to directly examine the colon (sigmoidoscopy or colonoscopy), can detect the earliest forms of colorectal cancer. Talk to your caregiver about this at age 74, when routine screening begins. Direct examination of the colon should be repeated every 5 to 10 years through age 34, unless early forms of pre-cancerous  polyps or small growths are found.  Hepatitis C blood testing is recommended for all people born from [birth year] through 1963 and any individual with known risks for hepatitis C.  Practice safe sex. Use condoms and avoid high-risk sexual practices to reduce the spread of sexually transmitted infections (STIs). Sexually active women aged 39 and younger should be checked for Chlamydia, which is a common sexually transmitted infection. Older women with new or multiple partners should also be tested for Chlamydia. Testing for other STIs is recommended if you are sexually active and at increased risk.  Osteoporosis is a disease in which the bones lose minerals and strength with aging. This can result in serious bone fractures. The risk of osteoporosis can be identified using a bone density scan. Women ages 53 and over and women at risk for fractures or osteoporosis should discuss screening with their caregivers. Ask your caregiver whether you should be taking a calcium supplement or vitamin D to reduce the rate of osteoporosis.  Menopause can be associated with physical symptoms and risks. Hormone replacement therapy is available to decrease symptoms and risks. You should talk to your caregiver about whether hormone replacement therapy is right for you.  Use sunscreen. Apply sunscreen liberally and repeatedly throughout the day. You should seek shade when your shadow is shorter than you. Protect yourself by wearing long sleeves, pants, a wide-brimmed hat, and sunglasses year round, whenever you are outdoors.  Notify your caregiver of new moles or changes in moles, especially if there is a change in shape or color. Also notify your caregiver if a mole is larger than the size of a pencil eraser.  Stay current with your immunizations. Document Released: 04/05/2011 Document Revised: 01/15/2013 Document Reviewed: 04/05/2011 [facility] Patient Information 2014 [place].

## 2014-12-17 LAB — GC/CHLAMYDIA PROBE AMP
CT Probe RNA: NEGATIVE
GC Probe RNA: NEGATIVE

## 2014-12-17 LAB — DHEA-SULFATE: DHEA-SO4: 85 ug/dL (ref 18–391)

## 2014-12-17 LAB — HEPATITIS C ANTIBODY: HCV Ab: NEGATIVE

## 2014-12-17 LAB — RPR

## 2014-12-17 LAB — HIV ANTIBODY (ROUTINE TESTING W REFLEX): HIV 1&2 Ab, 4th Generation: NONREACTIVE

## 2014-12-17 LAB — TESTOSTERONE: Testosterone: 108 ng/dL — ABNORMAL HIGH (ref 10–70)

## 2014-12-17 LAB — HEPATITIS B SURFACE ANTIGEN: Hepatitis B Surface Ag: NEGATIVE

## 2014-12-17 LAB — CYTOLOGY - PAP

## 2014-12-19 LAB — 17-HYDROXYPROGESTERONE: 17-OH-Progesterone, LC/MS/MS: 236 ng/dL

## 2014-12-20 ENCOUNTER — Other Ambulatory Visit: Payer: Self-pay | Admitting: Gynecology

## 2014-12-20 DIAGNOSIS — D508 Other iron deficiency anemias: Secondary | ICD-10-CM

## 2014-12-20 DIAGNOSIS — R899 Unspecified abnormal finding in specimens from other organs, systems and tissues: Secondary | ICD-10-CM

## 2014-12-31 ENCOUNTER — Ambulatory Visit: Payer: PRIVATE HEALTH INSURANCE | Admitting: Gynecology

## 2014-12-31 ENCOUNTER — Other Ambulatory Visit: Payer: PRIVATE HEALTH INSURANCE

## 2015-01-01 ENCOUNTER — Encounter: Payer: Self-pay | Admitting: Gynecology

## 2015-01-13 ENCOUNTER — Other Ambulatory Visit: Payer: PRIVATE HEALTH INSURANCE

## 2015-01-13 ENCOUNTER — Ambulatory Visit: Payer: PRIVATE HEALTH INSURANCE | Admitting: Gynecology

## 2015-05-09 ENCOUNTER — Inpatient Hospital Stay (HOSPITAL_COMMUNITY)
Admission: AD | Admit: 2015-05-09 | Discharge: 2015-05-09 | Disposition: A | Discharge status: Home / Self Care | Payer: Private Health Insurance - Indemnity | Source: Ambulatory Visit | Attending: Gynecology | Admitting: Gynecology

## 2015-05-09 ENCOUNTER — Encounter (HOSPITAL_COMMUNITY): Payer: Self-pay | Admitting: *Deleted

## 2015-05-09 DIAGNOSIS — Z882 Allergy status to sulfonamides status: Secondary | ICD-10-CM | POA: Insufficient documentation

## 2015-05-09 DIAGNOSIS — N9089 Other specified noninflammatory disorders of vulva and perineum: Secondary | ICD-10-CM | POA: Diagnosis not present

## 2015-05-09 DIAGNOSIS — F17210 Nicotine dependence, cigarettes, uncomplicated: Secondary | ICD-10-CM | POA: Insufficient documentation

## 2015-05-09 LAB — GC/CHLAMYDIA PROBE AMP (~~LOC~~) NOT AT ARMC
CHLAMYDIA, DNA PROBE: NEGATIVE
NEISSERIA GONORRHEA: NEGATIVE

## 2015-05-09 LAB — URINALYSIS, ROUTINE W REFLEX MICROSCOPIC
BILIRUBIN URINE: NEGATIVE
GLUCOSE, UA: NEGATIVE mg/dL
Ketones, ur: NEGATIVE mg/dL
Nitrite: NEGATIVE
PH: 6 (ref 5.0–8.0)
PROTEIN: NEGATIVE mg/dL
Specific Gravity, Urine: >1.03 — ABNORMAL HIGH (ref 1.005–1.030)
UROBILINOGEN UA: 0.2 mg/dL (ref 0.0–1.0)

## 2015-05-09 LAB — POCT PREGNANCY, URINE: PREG TEST UR: NEGATIVE

## 2015-05-09 LAB — URINE MICROSCOPIC-ADD ON

## 2015-05-09 LAB — WET PREP, GENITAL
Clue Cells Wet Prep HPF POC: NONE SEEN
Trich, Wet Prep: NONE SEEN
Yeast Wet Prep HPF POC: NONE SEEN

## 2015-05-09 MED ORDER — FLUCONAZOLE 150 MG PO TABS: 150.0000 mg | ORAL_TABLET | Freq: Once | ORAL | Status: DC

## 2015-05-09 NOTE — MAU Note (Signed)
PT SAYS SHE HAS VAG IRRITATION-   HAS INCREASE FREQ  WITH VOIDING - STARTED ON Monday .  DENIES VAG D/C  - NO ODOR      STARTED 7 DAY TX - MONISTAT-  HAS BECOME  WORSE.    NO BIRTH CONTROL.  LAST SEX-  Friday

## 2015-05-09 NOTE — Discharge Instructions (Signed)

## 2015-05-09 NOTE — MAU Note (Signed)
Pt states she has had vaginal irritation for the last 5 days, used OTC yeast cream with no relief.

## 2015-05-09 NOTE — MAU Provider Note (Signed)
History     CSN: [id number]  Arrival date and time: 05/09/15 0425   First Provider Initiated Contact with Patient 05/09/15 [phone]      No chief complaint on file.  HPI Comments: Laura Gutierrez is a 29 y.o. G0P0 who presents today with vulvar irritation. She states that it started on Monday. She started using an OTC yeast treatment on Tuesday. She feels more irritated since she started the treatment. She rates her pain 6/10.   Vaginal Discharge The patient's primary symptoms include vaginal discharge. This is a new problem. Episode onset: Since Monday  The problem occurs constantly. The problem has been unchanged. The pain is mild. She is not pregnant. Associated symptoms include frequency. Pertinent negatives include no abdominal pain, constipation, diarrhea, dysuria, fever, nausea, urgency or vomiting. The vaginal discharge was normal. There has been no bleeding. She has tried antifungals (has done 3 days of a 7 day treatment ) for the symptoms. The treatment provided no relief (patient feels like she has been more irritated after using the OTC yeast treatment. ). She is sexually active. It is possible that her partner has an STD. She uses nothing for contraception.     Past Medical History  Diagnosis Date  . History of gonorrhea 11/2009&10/2011    + GC CULTURE  . Obesity   . STD (sexually transmitted disease)     Gonorrhea Hx    Past Surgical History  Procedure Laterality Date  . Tonsillectomy      AGE 53    Family History  Problem Relation Age of Onset  . Hypertension Mother   . Alzheimer's disease Maternal Aunt   . Heart disease Maternal Grandfather   . Diabetes Paternal Grandmother     History  Substance Use Topics  . Smoking status: Current Every Day Smoker -- 0.25 packs/day    Types: Cigarettes  . Smokeless tobacco: Never Used  . Alcohol Use: 0.0 oz/week    0 Standard drinks or equivalent per week     Comment: sometimes   LAST DRANK  - 05-03-2015    Allergies:   Allergies  Allergen Reactions  . Bactrim [Sulfamethoxazole-Trimethoprim] Anaphylaxis    Prescriptions prior to admission  Medication Sig Dispense Refill Last Dose  . acetaminophen (TYLENOL) 500 MG tablet Take 1,000 mg by mouth every 6 (six) hours as needed for headache.   Past Week at Unknown time  . ibuprofen (ADVIL,MOTRIN) 200 MG tablet Take 800 mg by mouth every 6 (six) hours as needed for moderate pain.   More than a month at Unknown time    Review of Systems  Constitutional: Negative for fever.  Gastrointestinal: Negative for nausea, vomiting, abdominal pain, diarrhea and constipation.  Genitourinary: Positive for frequency and vaginal discharge. Negative for dysuria and urgency.   Physical Exam   Blood pressure 149/98, pulse 104, temperature 98.4 F (36.9 C), temperature source Oral, resp. rate 18, height  (1.651 m), weight 134.718 kg (297 lb), last menstrual period 04/25/2015, SpO2 99 %.  Physical Exam  Nursing note and vitals reviewed. Constitutional: She appears well-developed and well-nourished. No distress.  HENT:  Head: Normocephalic.  Cardiovascular: Normal rate.   Respiratory: Effort normal.  GI: Soft. There is no tenderness.  Genitourinary:  No redness or irritation visible externally. Patient unable to tolerate speculum exam Wet prep collected with blind sweep. No visible discharge externally. Patient also reports that she used yeast infection cream last night.   Neurological: She is alert.  Skin: Skin is warm and  dry.  Psychiatric: She has a normal mood and affect.   Results for orders placed or performed during the hospital encounter of 05/09/15 (from the past 24 hour(s))  Urinalysis, Routine w reflex microscopic (not at [facility])     Status: Abnormal   Collection Time: 05/09/15  4:35 AM  Result Value Ref Range   Color, Urine YELLOW YELLOW   APPearance HAZY (A) CLEAR   Specific Gravity, Urine >1.030 (H) 1.005 - 1.030   pH 6.0 5.0 - 8.0   Glucose, UA  NEGATIVE NEGATIVE mg/dL   Hgb urine dipstick TRACE (A) NEGATIVE   Bilirubin Urine NEGATIVE NEGATIVE   Ketones, ur NEGATIVE NEGATIVE mg/dL   Protein, ur NEGATIVE NEGATIVE mg/dL   Urobilinogen, UA 0.2 0.0 - 1.0 mg/dL   Nitrite NEGATIVE NEGATIVE   Leukocytes, UA SMALL (A) NEGATIVE  Urine microscopic-add on     Status: Abnormal   Collection Time: 05/09/15  4:35 AM  Result Value Ref Range   Squamous Epithelial / LPF MANY (A) RARE   WBC, UA 3-6 <3 WBC/hpf   RBC / HPF 0-2 <3 RBC/hpf   Bacteria, UA MANY (A) RARE  Pregnancy, urine POC     Status: None   Collection Time: 05/09/15  4:45 AM  Result Value Ref Range   Preg Test, Ur NEGATIVE NEGATIVE  Wet prep, genital     Status: Abnormal   Collection Time: 05/09/15  6:05 AM  Result Value Ref Range   Yeast Wet Prep HPF POC NONE SEEN NONE SEEN   Trich, Wet Prep NONE SEEN NONE SEEN   Clue Cells Wet Prep HPF POC NONE SEEN NONE SEEN   WBC, Wet Prep HPF POC FEW (A) NONE SEEN    MAU Course  Procedures  MDM Unsure if wet prep sample was adequate 2/2 use of yeast infection cream. Will treat with diflucan and FU with OBGYN if sx worsen or persist.   Assessment and Plan   1. Vulvar irritation    DC home Comfort measures reviewed  Stop OTC yeast treatment RX: diflucan #2  Return to MAU as needed FU with GYN  as planned  Follow-up Information    Follow up with Dara Lords, MD.   Specialties:  Gynecology, Radiology   Why:  If symptoms worsen   Contact information:   [address] [place] [address] [place] [address] [phone]         Tawnya Crook 05/09/2015, 6:28 AM

## 2015-05-11 LAB — URINE CULTURE

## 2015-05-12 ENCOUNTER — Telehealth: Payer: Self-pay | Admitting: Advanced Practice Midwife

## 2015-05-12 MED ORDER — AMOXICILLIN 500 MG PO CAPS: 500.0000 mg | ORAL_CAPSULE | Freq: Three times a day (TID) | ORAL | Status: DC

## 2015-05-12 NOTE — Telephone Encounter (Signed)
Called pt to notify her of positive urine culture.  Amoxicillin 500 mg TID x 7 days sent to [facility] on [address]  Pt states understanding.

## 2016-12-17 ENCOUNTER — Ambulatory Visit (INDEPENDENT_AMBULATORY_CARE_PROVIDER_SITE_OTHER): Payer: Self-pay | Admitting: Physician Assistant

## 2016-12-17 ENCOUNTER — Encounter (INDEPENDENT_AMBULATORY_CARE_PROVIDER_SITE_OTHER): Payer: Self-pay | Admitting: Physician Assistant

## 2016-12-17 ENCOUNTER — Other Ambulatory Visit (HOSPITAL_COMMUNITY)
Admission: RE | Admit: 2016-12-17 | Discharge: 2016-12-17 | Disposition: A | Discharge status: Home / Self Care | Payer: Self-pay | Source: Ambulatory Visit | Attending: Physician Assistant | Admitting: Physician Assistant

## 2016-12-17 VITALS — BP 154/107 | HR 99 | Temp 98.5°F | Ht 66.0 in | Wt 323.2 lb

## 2016-12-17 DIAGNOSIS — R0982 Postnasal drip: Secondary | ICD-10-CM

## 2016-12-17 DIAGNOSIS — N390 Urinary tract infection, site not specified: Secondary | ICD-10-CM

## 2016-12-17 DIAGNOSIS — R8281 Pyuria: Secondary | ICD-10-CM

## 2016-12-17 DIAGNOSIS — Z7251 High risk heterosexual behavior: Secondary | ICD-10-CM

## 2016-12-17 DIAGNOSIS — Z113 Encounter for screening for infections with a predominantly sexual mode of transmission: Secondary | ICD-10-CM | POA: Insufficient documentation

## 2016-12-17 LAB — POCT URINALYSIS DIPSTICK
Bilirubin, UA: NEGATIVE
Glucose, UA: NEGATIVE
KETONES UA: NEGATIVE
NITRITE UA: NEGATIVE
PH UA: 6.5 (ref 5.0–8.0)
Protein, UA: NEGATIVE
RBC UA: NEGATIVE
Spec Grav, UA: 1.025 (ref 1.030–1.035)
Urobilinogen, UA: 0.2 (ref ?–2.0)

## 2016-12-17 LAB — POCT URINE PREGNANCY: Preg Test, Ur: NEGATIVE

## 2016-12-17 MED ORDER — DIPHENHYDRAMINE HCL 25 MG PO TABS: 25.0000 mg | ORAL_TABLET | Freq: Four times a day (QID) | ORAL | 0 refills | Status: DC | PRN

## 2016-12-17 NOTE — Patient Instructions (Signed)
Safe Sex Practicing safe sex means taking steps before and during sex to reduce your risk of:  Getting an STD (sexually transmitted disease).  Giving your partner an STD.  Unwanted pregnancy. How can I practice safe sex?   To practice safe sex:  Limit your sexual partners to only one partner who is having sex with only you.  Avoid using alcohol and recreational drugs before having sex. These substances can affect your judgment.  Before having sex with a new partner:  Talk to your partner about past partners, past STDs, and drug use.  You and your partner should be screened for STDs and discuss the results with each other.  Check your body regularly for sores, blisters, rashes, or unusual discharge. If you notice any of these problems, visit your health care provider.  If you have symptoms of an infection or you are being treated for an STD, avoid sexual contact.  While having sex, use a condom. Make sure to:  Use a condom every time you have vaginal, oral, or anal sex. Both females and males should wear condoms during oral sex.  Keep condoms in place from the beginning to the end of sexual activity.  Use a latex condom, if possible. Latex condoms offer the best protection.  Use only water-based lubricants or oils to lubricate a condom. Using petroleum-based lubricants or oils will weaken the condom and increase the chance that it will break.  See your health care provider for regular screenings, exams, and tests for STDs.  Talk with your health care provider about the form of birth control (contraception) that is best for you.  Get vaccinated against hepatitis B and human papillomavirus (HPV).  If you are at risk of being infected with HIV (human immunodeficiency virus), talk with your health care provider about taking a prescription medicine to prevent HIV infection. You are considered at risk for HIV if:  You are a man who has sex with other men.  You are a  heterosexual man or woman who is sexually active with more than one partner.  You take drugs by injection.  You are sexually active with a partner who has HIV. This information is not intended to replace advice given to you by your health care provider. Make sure you discuss any questions you have with your health care provider. Document Released: 10/28/2004 Document Revised: 02/04/2016 Document Reviewed: 08/10/2015 Elsevier Interactive Patient Education  2017 Elsevier Inc.  

## 2016-12-17 NOTE — Progress Notes (Signed)
Subjective:  Patient ID: Laura Gutierrez, female    DOB: 11/20/1985  Age: 31 y.o. MRN: [id number]  CC: cough, STI screening  HPI Laura Gutierrez is a 31 y.o. female with a medical history of obesity presents with cough x 2-3 weeks. She was previously ill with a cold but is concerned that she still has a cough. Malaise, fever, chills, and fatigue have resolved.     She is requested an STI screening. Has two sexual partners and has unprotected sex with both of them. Denies genital lesions, vaginal discharge, vaginal itching/burning, dysuria, abdominal pain, fever, chills, nausea,    Outpatient Medications Prior to Visit  Medication Sig Dispense Refill  . acetaminophen (TYLENOL) 500 MG tablet Take 1,000 mg by mouth every 6 (six) hours as needed for headache.    . ibuprofen (ADVIL,MOTRIN) 200 MG tablet Take 800 mg by mouth every 6 (six) hours as needed for moderate pain.    Marland Kitchen. amoxicillin (AMOXIL) 500 MG capsule Take 1 capsule (500 mg total) by mouth 3 (three) times daily. 21 capsule 0  . fluconazole (DIFLUCAN) 150 MG tablet Take 1 tablet (150 mg total) by mouth once. May repeat in 2 days if still having symptoms. 2 tablet 0   No facility-administered medications prior to visit.      ROS Review of Systems  Constitutional: Negative for chills, fever and malaise/fatigue.  Eyes: Negative for blurred vision.  Respiratory: Positive for cough. Negative for shortness of breath.   Cardiovascular: Negative for chest pain and palpitations.  Gastrointestinal: Negative for abdominal pain and nausea.  Genitourinary: Negative for dysuria, flank pain, frequency, hematuria and urgency.  Musculoskeletal: Negative for joint pain and myalgias.  Skin: Negative for rash.  Neurological: Negative for tingling and headaches.  Psychiatric/Behavioral: Negative for depression. The patient is not nervous/anxious.     Objective:  BP (!) 154/107   Pulse 99   Temp 98.5 F (36.9 C) (Oral)   Ht 5\' 6"  (1.676 m)   Wt  (!) 323 lb 3.2 oz (146.6 kg)   LMP 11/23/2016 (Exact Date)   SpO2 97%   BMI 52.17 kg/m   BP/Weight 12/17/2016 05/09/2015 12/16/2014  Systolic BP 154 162 142  Diastolic BP 107 85 84  Wt. (Lbs) 323.2 297 294  BMI 52.17 49.42 48.16      Physical Exam  Constitutional: She is oriented to person, place, and time.  Obese, NAD, polite  HENT:  Head: Normocephalic and atraumatic.  Eyes: No scleral icterus.  Neck: Normal range of motion. Neck supple.  Cardiovascular: Normal rate, regular rhythm and normal heart sounds.   Pulmonary/Chest: Effort normal and breath sounds normal.  Musculoskeletal: She exhibits no edema.  Neurological: She is alert and oriented to person, place, and time.  Skin: Skin is warm and dry.  Psychiatric: She has a normal mood and affect. Her behavior is normal. Thought content normal.  Poor judgment in regards to sexual behavior     Assessment & Plan:   1. Routine screening for STI (sexually transmitted infection) - Urinalysis Dipstick - RPR - HIV antibody - Urine cytology ancillary only  2. Postnasal discharge -Benadryl 25 mg qhs   3. Pyuria - Urine culture - Pt asymptomatic  Meds ordered this encounter  Medications  . diphenhydrAMINE (BENADRYL) 25 MG tablet    Sig: Take 1 tablet (25 mg total) by mouth every 6 (six) hours as needed.    Dispense:  30 tablet    Refill:  0    Order Specific  Question:   Supervising Provider    Answer:   Quentin Angst [[id number]]    Follow-up: Return in about 4 weeks (around 01/14/2017) for physical exam.   Loletta Specter PA

## 2016-12-18 LAB — HIV ANTIBODY (ROUTINE TESTING W REFLEX): HIV SCREEN 4TH GENERATION: NONREACTIVE

## 2016-12-18 LAB — URINE CULTURE

## 2016-12-18 LAB — RPR: RPR Ser Ql: NONREACTIVE

## 2016-12-23 ENCOUNTER — Ambulatory Visit (INDEPENDENT_AMBULATORY_CARE_PROVIDER_SITE_OTHER): Payer: Self-pay | Admitting: Physician Assistant

## 2016-12-23 ENCOUNTER — Encounter (INDEPENDENT_AMBULATORY_CARE_PROVIDER_SITE_OTHER): Payer: Self-pay | Admitting: Physician Assistant

## 2016-12-23 VITALS — BP 140/97 | HR 97 | Temp 98.2°F | Ht 66.0 in | Wt 321.6 lb

## 2016-12-23 DIAGNOSIS — I10 Essential (primary) hypertension: Secondary | ICD-10-CM

## 2016-12-23 DIAGNOSIS — E669 Obesity, unspecified: Secondary | ICD-10-CM

## 2016-12-23 LAB — URINE CYTOLOGY ANCILLARY ONLY: Candida vaginitis: NEGATIVE

## 2016-12-23 MED ORDER — HYDROCHLOROTHIAZIDE 12.5 MG PO TABS: 12.5000 mg | ORAL_TABLET | Freq: Every day | ORAL | 1 refills | Status: DC

## 2016-12-23 NOTE — Patient Instructions (Signed)
DASH Eating Plan DASH stands for "Dietary Approaches to Stop Hypertension." The DASH eating plan is a healthy eating plan that has been shown to reduce high blood pressure (hypertension). It may also reduce your risk for type 2 diabetes, heart disease, and stroke. The DASH eating plan may also help with weight loss. What are tips for following this plan? General guidelines  Avoid eating more than 2,300 mg (milligrams) of salt (sodium) a day. If you have hypertension, you may need to reduce your sodium intake to 1,500 mg a day.  Limit alcohol intake to no more than 1 drink a day for nonpregnant women and 2 drinks a day for men. One drink equals 12 oz of beer, 5 oz of wine, or 1 oz of hard liquor.  Work with your health care provider to maintain a healthy body weight or to lose weight. Ask what an ideal weight is for you.  Get at least 30 minutes of exercise that causes your heart to beat faster (aerobic exercise) most days of the week. Activities may include walking, swimming, or biking.  Work with your health care provider or diet and nutrition specialist (dietitian) to adjust your eating plan to your individual calorie needs. Reading food labels  Check food labels for the amount of sodium per serving. Choose foods with less than 5 percent of the Daily Value of sodium. Generally, foods with less than 300 mg of sodium per serving fit into this eating plan.  To find whole grains, look for the word "whole" as the first word in the ingredient list. Shopping  Buy products labeled as "low-sodium" or "no salt added."  Buy fresh foods. Avoid canned foods and premade or frozen meals. Cooking  Avoid adding salt when cooking. Use salt-free seasonings or herbs instead of table salt or sea salt. Check with your health care provider or pharmacist before using salt substitutes.  Do not fry foods. Cook foods using healthy methods such as baking, boiling, grilling, and broiling instead.  Cook with  heart-healthy oils, such as olive, canola, soybean, or sunflower oil. Meal planning   Eat a balanced diet that includes: ? 5 or more servings of fruits and vegetables each day. At each meal, try to fill half of your plate with fruits and vegetables. ? Up to 6-8 servings of whole grains each day. ? Less than 6 oz of lean meat, poultry, or fish each day. A 3-oz serving of meat is about the same size as a deck of cards. One egg equals 1 oz. ? 2 servings of low-fat dairy each day. ? A serving of nuts, seeds, or beans 5 times each week. ? Heart-healthy fats. Healthy fats called Omega-3 fatty acids are found in foods such as flaxseeds and coldwater fish, like sardines, salmon, and mackerel.  Limit how much you eat of the following: ? Canned or prepackaged foods. ? Food that is high in trans fat, such as fried foods. ? Food that is high in saturated fat, such as fatty meat. ? Sweets, desserts, sugary drinks, and other foods with added sugar. ? Full-fat dairy products.  Do not salt foods before eating.  Try to eat at least 2 vegetarian meals each week.  Eat more home-cooked food and less restaurant, buffet, and fast food.  When eating at a restaurant, ask that your food be prepared with less salt or no salt, if possible. What foods are recommended? The items listed may not be a complete list. Talk with your dietitian about what   dietary choices are best for you. Grains Whole-grain or whole-wheat bread. Whole-grain or whole-wheat pasta. Brown rice. Oatmeal. Quinoa. Bulgur. Whole-grain and low-sodium cereals. Pita bread. Low-fat, low-sodium crackers. Whole-wheat flour tortillas. Vegetables Fresh or frozen vegetables (raw, steamed, roasted, or grilled). Low-sodium or reduced-sodium tomato and vegetable juice. Low-sodium or reduced-sodium tomato sauce and tomato paste. Low-sodium or reduced-sodium canned vegetables. Fruits All fresh, dried, or frozen fruit. Canned fruit in natural juice (without  added sugar). Meat and other protein foods Skinless chicken or turkey. Ground chicken or turkey. Pork with fat trimmed off. Fish and seafood. Egg whites. Dried beans, peas, or lentils. Unsalted nuts, nut butters, and seeds. Unsalted canned beans. Lean cuts of beef with fat trimmed off. Low-sodium, lean deli meat. Dairy Low-fat (1%) or fat-free (skim) milk. Fat-free, low-fat, or reduced-fat cheeses. Nonfat, low-sodium ricotta or cottage cheese. Low-fat or nonfat yogurt. Low-fat, low-sodium cheese. Fats and oils Soft margarine without trans fats. Vegetable oil. Low-fat, reduced-fat, or light mayonnaise and salad dressings (reduced-sodium). Canola, safflower, olive, soybean, and sunflower oils. Avocado. Seasoning and other foods Herbs. Spices. Seasoning mixes without salt. Unsalted popcorn and pretzels. Fat-free sweets. What foods are not recommended? The items listed may not be a complete list. Talk with your dietitian about what dietary choices are best for you. Grains Baked goods made with fat, such as croissants, muffins, or some breads. Dry pasta or rice meal packs. Vegetables Creamed or fried vegetables. Vegetables in a cheese sauce. Regular canned vegetables (not low-sodium or reduced-sodium). Regular canned tomato sauce and paste (not low-sodium or reduced-sodium). Regular tomato and vegetable juice (not low-sodium or reduced-sodium). Pickles. Olives. Fruits Canned fruit in a light or heavy syrup. Fried fruit. Fruit in cream or butter sauce. Meat and other protein foods Fatty cuts of meat. Ribs. Fried meat. Bacon. Sausage. Bologna and other processed lunch meats. Salami. Fatback. Hotdogs. Bratwurst. Salted nuts and seeds. Canned beans with added salt. Canned or smoked fish. Whole eggs or egg yolks. Chicken or turkey with skin. Dairy Whole or 2% milk, cream, and half-and-half. Whole or full-fat cream cheese. Whole-fat or sweetened yogurt. Full-fat cheese. Nondairy creamers. Whipped toppings.  Processed cheese and cheese spreads. Fats and oils Butter. Stick margarine. Lard. Shortening. Ghee. Bacon fat. Tropical oils, such as coconut, palm kernel, or palm oil. Seasoning and other foods Salted popcorn and pretzels. Onion salt, garlic salt, seasoned salt, table salt, and sea salt. Worcestershire sauce. Tartar sauce. Barbecue sauce. Teriyaki sauce. Soy sauce, including reduced-sodium. Steak sauce. Canned and packaged gravies. Fish sauce. Oyster sauce. Cocktail sauce. Horseradish that you find on the shelf. Ketchup. Mustard. Meat flavorings and tenderizers. Bouillon cubes. Hot sauce and Tabasco sauce. Premade or packaged marinades. Premade or packaged taco seasonings. Relishes. Regular salad dressings. Where to find more information:  National Heart, Lung, and Blood Institute: www.nhlbi.nih.gov  American Heart Association: www.heart.org Summary  The DASH eating plan is a healthy eating plan that has been shown to reduce high blood pressure (hypertension). It may also reduce your risk for type 2 diabetes, heart disease, and stroke.  With the DASH eating plan, you should limit salt (sodium) intake to 2,300 mg a day. If you have hypertension, you may need to reduce your sodium intake to 1,500 mg a day.  When on the DASH eating plan, aim to eat more fresh fruits and vegetables, whole grains, lean proteins, low-fat dairy, and heart-healthy fats.  Work with your health care provider or diet and nutrition specialist (dietitian) to adjust your eating plan to your individual   calorie needs. This information is not intended to replace advice given to you by your health care provider. Make sure you discuss any questions you have with your health care provider. Document Released: 09/09/2011 Document Revised: 09/13/2016 Document Reviewed: 09/13/2016 Elsevier Interactive Patient Education  2017 Elsevier Inc.  

## 2016-12-23 NOTE — Progress Notes (Signed)
  Subjective:  Patient ID: Laura Gutierrez, female    DOB: 03/24/1986  Age: 31 y.o. MRN: [id number]  CC: f/u elevated blood pressure  HPI Laura LevelsKeanna Gutierrez is a 31 y.o. female with no significant PMH presents on f/u of elevated blood pressure. Says she if feeling well and has no complaints.   Outpatient Medications Prior to Visit  Medication Sig Dispense Refill  . acetaminophen (TYLENOL) 500 MG tablet Take 1,000 mg by mouth every 6 (six) hours as needed for headache.    . diphenhydrAMINE (BENADRYL) 25 MG tablet Take 1 tablet (25 mg total) by mouth every 6 (six) hours as needed. 30 tablet 0  . ibuprofen (ADVIL,MOTRIN) 200 MG tablet Take 800 mg by mouth every 6 (six) hours as needed for moderate pain.     No facility-administered medications prior to visit.      ROS Review of Systems  Constitutional: Negative for chills, fever and malaise/fatigue.  Eyes: Negative for blurred vision.  Respiratory: Negative for shortness of breath.   Cardiovascular: Negative for chest pain and palpitations.  Gastrointestinal: Negative for abdominal pain and nausea.  Genitourinary: Negative for dysuria and hematuria.  Musculoskeletal: Negative for joint pain and myalgias.  Skin: Negative for rash.  Neurological: Negative for tingling and headaches.  Psychiatric/Behavioral: Negative for depression. The patient is not nervous/anxious.     Objective:  BP (!) 155/103 (BP Location: Right Arm, Patient Position: Sitting, Cuff Size: Large)   Pulse 97   Temp 98.2 F (36.8 C) (Oral)   Ht 5\' 6"  (1.676 m)   Wt (!) 321 lb 9.6 oz (145.9 kg)   LMP 11/23/2016 (Exact Date)   SpO2 98%   BMI 51.91 kg/m   BP/Weight 12/23/2016 12/17/2016 05/09/2015  Systolic BP 155 154 162  Diastolic BP 103 107 85  Wt. (Lbs) 321.6 323.2 297  BMI 51.91 52.17 49.42      Physical Exam  Constitutional: She is oriented to person, place, and time.  Morbidly, NAD, polite  HENT:  Head: Normocephalic and atraumatic.  Eyes: No scleral  icterus.  Neck: Normal range of motion. Neck supple. No thyromegaly present.  Cardiovascular: Normal rate, regular rhythm and normal heart sounds.   Pulmonary/Chest: Effort normal and breath sounds normal.  Abdominal: Soft. Bowel sounds are normal. There is no tenderness.  Musculoskeletal: She exhibits no edema.  Neurological: She is alert and oriented to person, place, and time.  Skin: Skin is warm and dry. No rash noted. No erythema. No pallor.  Psychiatric: She has a normal mood and affect. Her behavior is normal. Thought content normal.  Vitals reviewed.    Assessment & Plan:   1. Hypertension, unspecified type - hydrochlorothiazide (HYDRODIURIL) 12.5 MG tablet; Take 1 tablet (12.5 mg total) by mouth daily.  Dispense: 90 tablet; Refill: 1 - CBC With Differential - Comprehensive metabolic panel - Lipid Panel - TSH  2. Obesity with serious comorbidity, unspecified classification, unspecified obesity type - Comprehensive metabolic panel - Lipid Panel - TSH   Meds ordered this encounter  Medications  . hydrochlorothiazide (HYDRODIURIL) 12.5 MG tablet    Sig: Take 1 tablet (12.5 mg total) by mouth daily.    Dispense:  90 tablet    Refill:  1    Order Specific Question:   Supervising Provider    Answer:   Quentin AngstJEGEDE, OLUGBEMIGA E [id number]]    Follow-up: Return in about 4 weeks (around 01/20/2017) for HTN f/u.   Loletta Specteroger David Jaicion Laurie PA

## 2016-12-24 ENCOUNTER — Telehealth (INDEPENDENT_AMBULATORY_CARE_PROVIDER_SITE_OTHER): Payer: Self-pay | Admitting: Physician Assistant

## 2016-12-24 NOTE — Telephone Encounter (Signed)
Patient called stated Rx for blood pressure medication to expensive for her.  She was told there is another blood pressure medication that is cheap, but doesn't know the name.  [facility] [address]  Please call when called in

## 2016-12-27 ENCOUNTER — Telehealth (INDEPENDENT_AMBULATORY_CARE_PROVIDER_SITE_OTHER): Payer: Self-pay | Admitting: Physician Assistant

## 2016-12-27 NOTE — Telephone Encounter (Signed)
Patient called stated still waiting on Rx for blood pressure medication.  Last Rx to expensive, the pharmacy told her there is another medication less expensive, but she doesn't know the name of it.  Please follow up with patient

## 2016-12-27 NOTE — Telephone Encounter (Signed)
I called patient and left message stating that HCTZ is on the [facility] $4 list and to please call back to discuss the specifics of her concerns.

## 2016-12-28 ENCOUNTER — Telehealth (INDEPENDENT_AMBULATORY_CARE_PROVIDER_SITE_OTHER): Payer: Self-pay | Admitting: Physician Assistant

## 2016-12-28 NOTE — Telephone Encounter (Signed)
Patient called back stated missed a call from [facility] yesterday, Please call back at [phone]  Stated would like to speak to Sindy Messingoger Gomez.

## 2017-01-03 ENCOUNTER — Telehealth (INDEPENDENT_AMBULATORY_CARE_PROVIDER_SITE_OTHER): Payer: Self-pay | Admitting: *Deleted

## 2017-01-03 ENCOUNTER — Other Ambulatory Visit (INDEPENDENT_AMBULATORY_CARE_PROVIDER_SITE_OTHER): Payer: Self-pay | Admitting: Physician Assistant

## 2017-01-03 DIAGNOSIS — I10 Essential (primary) hypertension: Secondary | ICD-10-CM

## 2017-01-03 DIAGNOSIS — B9689 Other specified bacterial agents as the cause of diseases classified elsewhere: Secondary | ICD-10-CM

## 2017-01-03 DIAGNOSIS — N760 Acute vaginitis: Principal | ICD-10-CM

## 2017-01-03 MED ORDER — HYDROCHLOROTHIAZIDE 12.5 MG PO TABS
12.5000 mg | ORAL_TABLET | Freq: Every day | ORAL | 1 refills | Status: DC
Start: 2017-01-03 — End: 2017-05-27

## 2017-01-03 MED ORDER — METRONIDAZOLE 500 MG PO TABS: 500.0000 mg | ORAL_TABLET | Freq: Two times a day (BID) | ORAL | 0 refills | Status: AC

## 2017-01-03 NOTE — Telephone Encounter (Signed)
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 I will send out abx now. Please notify patient to pick up medication at [facility] pharmacy at [facility]. Pt should not take this medication if she suspects she may be pregnant or has a chance that she is pregnant. Return here for urine pregnancy test if desired.

## 2017-01-03 NOTE — Progress Notes (Signed)
Labs consistent with BV.

## 2017-01-03 NOTE — Telephone Encounter (Signed)
Patient verified DOB Patient is aware of BV being present and Flagyl being sent to the [facility] on [address]. Patient is advised to refrain from drinking alcohol and intercourse until treatment has been completed. Patient requested HCTZ be sent to [facility] on [address] as well to utilize the 4$ list. No further questions at this time.

## 2017-01-04 NOTE — Telephone Encounter (Signed)
We have already addressed this. HCTZ was originally sent to [facility] in the first place.

## 2017-02-10 ENCOUNTER — Inpatient Hospital Stay (HOSPITAL_COMMUNITY)
Admission: AD | Admit: 2017-02-10 | Discharge: 2017-02-10 | Disposition: A | Discharge status: Home / Self Care | Payer: Self-pay | Source: Ambulatory Visit | Attending: Obstetrics & Gynecology | Admitting: Obstetrics & Gynecology

## 2017-02-10 DIAGNOSIS — F17210 Nicotine dependence, cigarettes, uncomplicated: Secondary | ICD-10-CM | POA: Insufficient documentation

## 2017-02-10 DIAGNOSIS — Z79899 Other long term (current) drug therapy: Secondary | ICD-10-CM | POA: Insufficient documentation

## 2017-02-10 DIAGNOSIS — Z882 Allergy status to sulfonamides status: Secondary | ICD-10-CM | POA: Insufficient documentation

## 2017-02-10 DIAGNOSIS — B9689 Other specified bacterial agents as the cause of diseases classified elsewhere: Secondary | ICD-10-CM

## 2017-02-10 DIAGNOSIS — N760 Acute vaginitis: Secondary | ICD-10-CM | POA: Insufficient documentation

## 2017-02-10 LAB — URINALYSIS, ROUTINE W REFLEX MICROSCOPIC
Bilirubin Urine: NEGATIVE
Glucose, UA: NEGATIVE mg/dL
KETONES UR: NEGATIVE mg/dL
Nitrite: NEGATIVE
PH: 7 (ref 5.0–8.0)
PROTEIN: NEGATIVE mg/dL
Specific Gravity, Urine: 1.009 (ref 1.005–1.030)

## 2017-02-10 LAB — WET PREP, GENITAL
Sperm: NONE SEEN
Trich, Wet Prep: NONE SEEN
YEAST WET PREP: NONE SEEN

## 2017-02-10 LAB — POCT PREGNANCY, URINE: Preg Test, Ur: NEGATIVE

## 2017-02-10 MED ORDER — METRONIDAZOLE 500 MG PO TABS: 500.0000 mg | ORAL_TABLET | Freq: Two times a day (BID) | ORAL | 0 refills | Status: DC

## 2017-02-10 NOTE — Discharge Instructions (Signed)
Bacterial Vaginosis Bacterial vaginosis is a vaginal infection that occurs when the normal balance of bacteria in the vagina is disrupted. It results from an overgrowth of certain bacteria. This is the most common vaginal infection among women ages 2915-44. Because bacterial vaginosis increases your risk for STIs (sexually transmitted infections), getting treated can help reduce your risk for chlamydia, gonorrhea, herpes, and HIV (human immunodeficiency virus). Treatment is also important for preventing complications in pregnant women, because this condition can cause an early (premature) delivery. What are the causes? This condition is caused by an increase in harmful bacteria that are normally present in small amounts in the vagina. However, the reason that the condition develops is not fully understood. What increases the risk? The following factors may make you more likely to develop this condition:  Having a new sexual partner or multiple sexual partners.  Having unprotected sex.  Douching.  Having an intrauterine device (IUD).  Smoking.  Drug and alcohol abuse.  Taking certain antibiotic medicines.  Being pregnant. You cannot get bacterial vaginosis from toilet seats, bedding, swimming pools, or contact with objects around you. What are the signs or symptoms? Symptoms of this condition include:  Grey or white vaginal discharge. The discharge can also be watery or foamy.  A fish-like odor with discharge, especially after sexual intercourse or during menstruation.  Itching in and around the vagina.  Burning or pain with urination. Some women with bacterial vaginosis have no signs or symptoms. How is this diagnosed? This condition is diagnosed based on:  Your medical history.  A physical exam of the vagina.  Testing a sample of vaginal fluid under a microscope to look for a large amount of bad bacteria or abnormal cells. Your health care provider may use a cotton swab  or a small wooden spatula to collect the sample. How is this treated? This condition is treated with antibiotics. These may be given as a pill, a vaginal cream, or a medicine that is put into the vagina (suppository). If the condition comes back after treatment, a second round of antibiotics may be needed. Follow these instructions at home: Medicines   Take over-the-counter and prescription medicines only as told by your health care provider.  Take or use your antibiotic as told by your health care provider. Do not stop taking or using the antibiotic even if you start to feel better. General instructions   If you have a female sexual partner, tell her that you have a vaginal infection. She should see her health care provider and be treated if she has symptoms. If you have a female sexual partner, he does not need treatment.  During treatment:  Avoid sexual activity until you finish treatment.  Do not douche.  Avoid alcohol as directed by your health care provider.  Avoid breastfeeding as directed by your health care provider.  Drink enough water and fluids to keep your urine clear or pale yellow.  Keep the area around your vagina and rectum clean.  Wash the area daily with warm water.  Wipe yourself from front to back after using the toilet.  Keep all follow-up visits as told by your health care provider. This is important. How is this prevented?  Do not douche.  Wash the outside of your vagina with warm water only.  Use protection when having sex. This includes latex condoms and dental dams.  Limit how many sexual partners you have. To help prevent bacterial vaginosis, it is best to have sex with just  one partner (monogamous).  Make sure you and your sexual partner are tested for STIs.  Wear cotton or cotton-lined underwear.  Avoid wearing tight pants and pantyhose, especially during summer.  Limit the amount of alcohol that you drink.  Do not use any products that  contain nicotine or tobacco, such as cigarettes and e-cigarettes. If you need help quitting, ask your health care provider.  Do not use illegal drugs. Where to find more information:  Centers for Disease Control and Prevention: SolutionApps.co.za  American Sexual Health Association (ASHA): www.ashastd.org  U.S. Department of Health and Health and safety inspector, Office on Women's Health: ConventionalMedicines.si or http://www.anderson-williamson.info/ Contact a health care provider if:  Your symptoms do not improve, even after treatment.  You have more discharge or pain when urinating.  You have a fever.  You have pain in your abdomen.  You have pain during sex.  You have vaginal bleeding between periods. Summary  Bacterial vaginosis is a vaginal infection that occurs when the normal balance of bacteria in the vagina is disrupted.  Because bacterial vaginosis increases your risk for STIs (sexually transmitted infections), getting treated can help reduce your risk for chlamydia, gonorrhea, herpes, and HIV (human immunodeficiency virus). Treatment is also important for preventing complications in pregnant women, because the condition can cause an early (premature) delivery.  This condition is treated with antibiotic medicines. These may be given as a pill, a vaginal cream, or a medicine that is put into the vagina (suppository). This information is not intended to replace advice given to you by your health care provider. Make sure you discuss any questions you have with your health care provider. Document Released: 09/20/2005 Document Revised: 06/05/2016 Document Reviewed: 06/05/2016 Elsevier Interactive Patient Education  2017 Elsevier Inc.   [place] Providers    [facility] [employer] at [facility]       Phone: [phone]  [facility] [employer] at [place] Phone: [phone]  [facility] [employer] at  [place]  Phone: [phone]  Center for [facility] at [facility]  Phone: [phone]  [facility] for [facility] at [place]  Phone: [phone]  [facility]       Phone: [phone]  [facility]    Phone: [phone]   [facility])    Phone: [phone]  [facility]    Phone: [phone]  [facility]    Phone: [phone]  [facility]    Phone: [phone]  [facility]       Phone: [phone]   [facility]  Phone: [phone]  [facility]    Phone: [phone]  [facility]   Phone: [phone]  [facility]      Phone: [phone]  [facility]    Phone: [phone]

## 2017-02-10 NOTE — MAU Note (Signed)
Vaginal area irritable x 4 days. Thought it might be switched soaps. No bleeding. Not pregnant. Frequent urination.

## 2017-02-10 NOTE — MAU Provider Note (Signed)
Chief Complaint: vaginal   First Provider Initiated Contact with Patient 02/10/17 [phone]     SUBJECTIVE HPI: Laura Gutierrez is a 31 y.o. G0P0 female who presents to Maternity Admissions reporting vaginal discomfort x 4 days. Thinks changing soap caused Sx.   Location: Vagina Quality: irritated Severity: mild Duration: 4 day Context: Using new soap Modifying factors: Hasn't tried anything Associated signs and symptoms: Burning w/ urination.   Past Medical History:  Diagnosis Date  . History of gonorrhea 11/2009&10/2011   + GC CULTURE  . Obesity   . STD (sexually transmitted disease)    Gonorrhea Hx   OB History  Gravida Para Term Preterm AB Living  0            SAB TAB Ectopic Multiple Live Births                  Past Surgical History:  Procedure Laterality Date  . TONSILLECTOMY     AGE 57   Social History   Social History  . Marital status: Single    Spouse name: N/A  . Number of children: N/A  . Years of education: N/A   Occupational History  . Not on file.   Social History Main Topics  . Smoking status: Current Every Day Smoker    Packs/day: 0.25    Types: Cigarettes  . Smokeless tobacco: Never Used  . Alcohol use 0.0 oz/week  . Drug use: No  . Sexual activity: Yes    Birth control/ protection: None     Comment: 1st intercourse 31 yo-More than 5 partners   Other Topics Concern  . Not on file   Social History Narrative  . No narrative on file   Family History  Problem Relation Age of Onset  . Hypertension Mother   . Alzheimer's disease Maternal Aunt   . Heart disease Maternal Grandfather   . Diabetes Paternal Grandmother    No current facility-administered medications on file prior to encounter.    Current Outpatient Prescriptions on File Prior to Encounter  Medication Sig Dispense Refill  . acetaminophen (TYLENOL) 500 MG tablet Take 1,000 mg by mouth every 6 (six) hours as needed for headache.    . diphenhydrAMINE (BENADRYL) 25 MG tablet  Take 1 tablet (25 mg total) by mouth every 6 (six) hours as needed. 30 tablet 0  . hydrochlorothiazide (HYDRODIURIL) 12.5 MG tablet Take 1 tablet (12.5 mg total) by mouth daily. 90 tablet 1  . ibuprofen (ADVIL,MOTRIN) 200 MG tablet Take 800 mg by mouth every 6 (six) hours as needed for moderate pain.     Allergies  Allergen Reactions  . Bactrim [Sulfamethoxazole-Trimethoprim] Anaphylaxis    I have reviewed patient's Past Medical Hx, Surgical Hx, Family Hx, Social Hx, medications and allergies.   Review of Systems  Constitutional: Negative for chills and fever.  Gastrointestinal: Negative for abdominal pain.  Genitourinary: Positive for dysuria. Negative for flank pain, frequency, hematuria, pelvic pain, vaginal bleeding and vaginal discharge.    OBJECTIVE Patient Vitals for the past 24 hrs:  BP Temp Temp src Pulse Resp SpO2 Height Weight  02/10/17 0406 (!) 137/97 98.2 F (36.8 C) Oral 93 18 100 % 5\' 6"  (1.676 m) (!) 321 lb 12 oz (145.9 kg)   Constitutional: Well-developed, well-nourished, morbidly obese female in no acute distress.  Cardiovascular: normal rate Respiratory: normal rate and effort.  Neurologic: Alert and oriented x 4.  GU: Blind GC/Chlamydia, wet prep collected  LAB RESULTS Results for orders placed or performed  during the hospital encounter of 02/10/17 (from the past 24 hour(s))  Urinalysis, Routine w reflex microscopic     Status: Abnormal   Collection Time: 02/10/17  4:05 AM  Result Value Ref Range   Color, Urine YELLOW YELLOW   APPearance HAZY (A) CLEAR   Specific Gravity, Urine 1.009 1.005 - 1.030   pH 7.0 5.0 - 8.0   Glucose, UA NEGATIVE NEGATIVE mg/dL   Hgb urine dipstick SMALL (A) NEGATIVE   Bilirubin Urine NEGATIVE NEGATIVE   Ketones, ur NEGATIVE NEGATIVE mg/dL   Protein, ur NEGATIVE NEGATIVE mg/dL   Nitrite NEGATIVE NEGATIVE   Leukocytes, UA LARGE (A) NEGATIVE   RBC / HPF 0-5 0 - 5 RBC/hpf   WBC, UA 6-30 0 - 5 WBC/hpf   Bacteria, UA RARE (A)  NONE SEEN   Squamous Epithelial / LPF 6-30 (A) NONE SEEN  Wet prep, genital     Status: Abnormal   Collection Time: 02/10/17  4:15 AM  Result Value Ref Range   Yeast Wet Prep HPF POC NONE SEEN NONE SEEN   Trich, Wet Prep NONE SEEN NONE SEEN   Clue Cells Wet Prep HPF POC PRESENT (A) NONE SEEN   WBC, Wet Prep HPF POC MODERATE (A) NONE SEEN   Sperm NONE SEEN     IMAGING No results found.  MAU COURSE Orders Placed This Encounter  Procedures  . Wet prep, genital  . Urinalysis, Routine w reflex microscopic  . Discharge patient   Meds ordered this encounter  Medications  . metroNIDAZOLE (FLAGYL) 500 MG tablet    Sig: Take 1 tablet (500 mg total) by mouth 2 (two) times daily.    Dispense:  14 tablet    Refill:  0    Order Specific Question:   Supervising Provider    Answer:   Duane Lope H [[id number]]    MDM - Vaginal irritation from BV.   ASSESSMENT 1. BV (bacterial vaginosis)     PLAN Discharge home in stable condition. List of Gynecologist given and reviewed indications for Emergency room visits vs office visits.  GC/Chlamydia pending Follow-up Information    Gynecologist Follow up.   Why:  for non-emergent gynecology care         Allergies as of 02/10/2017      Reactions   Bactrim [sulfamethoxazole-trimethoprim] Anaphylaxis      Medication List    TAKE these medications   acetaminophen 500 MG tablet Commonly known as:  TYLENOL Take 1,000 mg by mouth every 6 (six) hours as needed for headache.   diphenhydrAMINE 25 MG tablet Commonly known as:  BENADRYL Take 1 tablet (25 mg total) by mouth every 6 (six) hours as needed.   hydrochlorothiazide 12.5 MG tablet Commonly known as:  HYDRODIURIL Take 1 tablet (12.5 mg total) by mouth daily.   ibuprofen 200 MG tablet Commonly known as:  ADVIL,MOTRIN Take 800 mg by mouth every 6 (six) hours as needed for moderate pain.   metroNIDAZOLE 500 MG tablet Commonly known as:  FLAGYL Take 1 tablet (500 mg total) by  mouth 2 (two) times daily.        Katrinka Blazing, [place], CNM 02/10/2017  5:13 AM

## 2017-02-11 LAB — GC/CHLAMYDIA PROBE AMP (~~LOC~~) NOT AT ARMC
Chlamydia: NEGATIVE
Neisseria Gonorrhea: NEGATIVE

## 2017-03-01 ENCOUNTER — Encounter (INDEPENDENT_AMBULATORY_CARE_PROVIDER_SITE_OTHER): Payer: Self-pay | Admitting: Physician Assistant

## 2017-03-02 ENCOUNTER — Encounter (HOSPITAL_COMMUNITY): Payer: Self-pay | Admitting: *Deleted

## 2017-03-02 ENCOUNTER — Inpatient Hospital Stay (HOSPITAL_COMMUNITY)
Admission: AD | Admit: 2017-03-02 | Discharge: 2017-03-02 | Disposition: A | Discharge status: Home / Self Care | Payer: Self-pay | Source: Ambulatory Visit | Attending: Family Medicine | Admitting: Family Medicine

## 2017-03-02 DIAGNOSIS — N898 Other specified noninflammatory disorders of vagina: Secondary | ICD-10-CM | POA: Insufficient documentation

## 2017-03-02 DIAGNOSIS — A6004 Herpesviral vulvovaginitis: Secondary | ICD-10-CM

## 2017-03-02 DIAGNOSIS — I10 Essential (primary) hypertension: Secondary | ICD-10-CM | POA: Insufficient documentation

## 2017-03-02 DIAGNOSIS — B009 Herpesviral infection, unspecified: Secondary | ICD-10-CM | POA: Insufficient documentation

## 2017-03-02 DIAGNOSIS — Z9889 Other specified postprocedural states: Secondary | ICD-10-CM | POA: Insufficient documentation

## 2017-03-02 DIAGNOSIS — Z79899 Other long term (current) drug therapy: Secondary | ICD-10-CM | POA: Insufficient documentation

## 2017-03-02 DIAGNOSIS — Z882 Allergy status to sulfonamides status: Secondary | ICD-10-CM | POA: Insufficient documentation

## 2017-03-02 DIAGNOSIS — F17210 Nicotine dependence, cigarettes, uncomplicated: Secondary | ICD-10-CM | POA: Insufficient documentation

## 2017-03-02 DIAGNOSIS — E669 Obesity, unspecified: Secondary | ICD-10-CM | POA: Insufficient documentation

## 2017-03-02 DIAGNOSIS — R300 Dysuria: Secondary | ICD-10-CM | POA: Insufficient documentation

## 2017-03-02 LAB — URINALYSIS, ROUTINE W REFLEX MICROSCOPIC
Bilirubin Urine: NEGATIVE
Glucose, UA: NEGATIVE mg/dL
HGB URINE DIPSTICK: NEGATIVE
Ketones, ur: NEGATIVE mg/dL
Nitrite: NEGATIVE
Protein, ur: NEGATIVE mg/dL
SPECIFIC GRAVITY, URINE: 1.025 (ref 1.005–1.030)
pH: 5 (ref 5.0–8.0)

## 2017-03-02 LAB — GC/CHLAMYDIA PROBE AMP (~~LOC~~) NOT AT ARMC
Chlamydia: NEGATIVE
Neisseria Gonorrhea: NEGATIVE

## 2017-03-02 LAB — WET PREP, GENITAL
Clue Cells Wet Prep HPF POC: NONE SEEN
SPERM: NONE SEEN
TRICH WET PREP: NONE SEEN
YEAST WET PREP: NONE SEEN

## 2017-03-02 MED ORDER — IBUPROFEN 600 MG PO TABS: 600.0000 mg | ORAL_TABLET | Freq: Four times a day (QID) | ORAL | 0 refills | Status: DC | PRN

## 2017-03-02 MED ORDER — ACYCLOVIR 200 MG PO CAPS: 400.0000 mg | ORAL_CAPSULE | Freq: Three times a day (TID) | ORAL | 0 refills | Status: AC

## 2017-03-02 MED ORDER — IBUPROFEN 800 MG PO TABS
800.0000 mg | ORAL_TABLET | Freq: Once | ORAL | Status: AC
  Administered 2017-03-02: 800 mg via ORAL
  Filled 2017-03-02: qty 1

## 2017-03-02 MED ORDER — ACYCLOVIR 200 MG PO CAPS: 800.0000 mg | ORAL_CAPSULE | Freq: Two times a day (BID) | ORAL | 5 refills | Status: AC

## 2017-03-02 MED ORDER — LIDOCAINE HCL 2 % EX GEL
1.0000 "application " | Freq: Once | CUTANEOUS | Status: AC
  Administered 2017-03-02: 1 via TOPICAL
  Filled 2017-03-02: qty 5

## 2017-03-02 MED ORDER — LIDOCAINE HCL 2 % EX GEL: 1.0000 "application " | Freq: Once | CUTANEOUS | 0 refills | Status: AC

## 2017-03-02 NOTE — MAU Note (Signed)
PT  SAYS SHE  WENT  TO DR  IN April -  ALL TESTING-     GOT   MESSAGE  HAD  BV   1 WEEK LATER- SO CALLED IN MEDS-    TOOK  ALL .     THEN CAME HERE ON 5-10-  FOR  VAG DISCOMFORT- CX-  TOLD  HAD BV   .     NOW  VAG IS SWOLLEN , NO D/C - BIT IRRITATED   -  ALSO HAS BURNING  ONCE URINE HITS PERINEUM    DOES HAVE INCREASE  IN FREQ.

## 2017-03-02 NOTE — MAU Provider Note (Signed)
Chief Complaint: No chief complaint on file.   None     SUBJECTIVE HPI: Laura Gutierrez is a 31 y.o. who presents to maternity admissions reporting vaginal irritation and pain since 5/10 when she was seen in MAU and treated for BV. She denies vaginal discharge but reports irritation and vaginal swelling.  She reports dysuria, but denies any other associated symptoms. She has taken Flagyl x 2 in 2 months but the treatment starting on 5/10 has not been effective.  She has never had an HSV outbreak before. She denies vaginal bleeding, h/a, dizziness, n/v, or fever/chills.     HPI  Past Medical History:  Diagnosis Date  . History of gonorrhea 11/2009&10/2011   + GC CULTURE  . Obesity   . STD (sexually transmitted disease)    Gonorrhea Hx   Past Surgical History:  Procedure Laterality Date  . TONSILLECTOMY     AGE 58   Social History   Social History  . Marital status: Single    Spouse name: N/A  . Number of children: N/A  . Years of education: N/A   Occupational History  . Not on file.   Social History Main Topics  . Smoking status: Current Every Day Smoker    Packs/day: 0.25    Types: Cigarettes  . Smokeless tobacco: Never Used  . Alcohol use 0.0 oz/week     Comment: sometimes   LAST DRANK  - 05-03-2015  . Drug use: No  . Sexual activity: Yes    Birth control/ protection: None     Comment: 1st intercourse 31 yo-More than 5 partners   Other Topics Concern  . Not on file   Social History Narrative  . No narrative on file   No current facility-administered medications on file prior to encounter.    Current Outpatient Prescriptions on File Prior to Encounter  Medication Sig Dispense Refill  . acetaminophen (TYLENOL) 500 MG tablet Take 1,000 mg by mouth every 6 (six) hours as needed for headache.    . diphenhydrAMINE (BENADRYL) 25 MG tablet Take 1 tablet (25 mg total) by mouth every 6 (six) hours as needed. 30 tablet 0  . hydrochlorothiazide (HYDRODIURIL) 12.5 MG  tablet Take 1 tablet (12.5 mg total) by mouth daily. 90 tablet 1  . ibuprofen (ADVIL,MOTRIN) 200 MG tablet Take 800 mg by mouth every 6 (six) hours as needed for moderate pain.    . metroNIDAZOLE (FLAGYL) 500 MG tablet Take 1 tablet (500 mg total) by mouth 2 (two) times daily. 14 tablet 0   Allergies  Allergen Reactions  . Bactrim [Sulfamethoxazole-Trimethoprim] Anaphylaxis    ROS:  Review of Systems  Constitutional: Negative for chills, fatigue and fever.  Respiratory: Negative for shortness of breath.   Cardiovascular: Negative for chest pain.  Gastrointestinal: Negative for nausea and vomiting.  Genitourinary: Positive for pelvic pain and vaginal pain. Negative for difficulty urinating, dysuria, flank pain, vaginal bleeding and vaginal discharge.  Neurological: Negative for dizziness and headaches.  Psychiatric/Behavioral: Negative.      I have reviewed patient's Past Medical Hx, Surgical Hx, Family Hx, Social Hx, medications and allergies.   Physical Exam  Patient Vitals for the past 24 hrs:  BP Temp Temp src Pulse Resp Height Weight  03/02/17 0224 (!) 174/99 98.4 F (36.9 C) Oral 91 18 5\' 6"  (1.676 m) (!) 315 lb 12 oz (143.2 kg)   Constitutional: Well-developed, well-nourished female in no acute distress.  Cardiovascular: normal rate Respiratory: normal effort GI: Abd soft, non-tender. Pos  BS x 4 MS: Extremities nontender, no edema, normal ROM Neurologic: Alert and oriented x 4.  GU: Neg CVAT.  PELVIC EXAM: On visual inspection, multiple flat lesions on labia, with significant pain to palpation, also moderate edema noted in bilateral labia    LAB RESULTS Results for orders placed or performed during the hospital encounter of 03/02/17 (from the past 24 hour(s))  Urinalysis, Routine w reflex microscopic     Status: Abnormal   Collection Time: 03/02/17  2:27 AM  Result Value Ref Range   Color, Urine YELLOW YELLOW   APPearance CLEAR CLEAR   Specific Gravity, Urine  1.025 1.005 - 1.030   pH 5.0 5.0 - 8.0   Glucose, UA NEGATIVE NEGATIVE mg/dL   Hgb urine dipstick NEGATIVE NEGATIVE   Bilirubin Urine NEGATIVE NEGATIVE   Ketones, ur NEGATIVE NEGATIVE mg/dL   Protein, ur NEGATIVE NEGATIVE mg/dL   Nitrite NEGATIVE NEGATIVE   Leukocytes, UA TRACE (A) NEGATIVE   RBC / HPF 0-5 0 - 5 RBC/hpf   WBC, UA 0-5 0 - 5 WBC/hpf   Bacteria, UA RARE (A) NONE SEEN   Squamous Epithelial / LPF 0-5 (A) NONE SEEN   Mucous PRESENT   Wet prep, genital     Status: Abnormal   Collection Time: 03/02/17  6:10 AM  Result Value Ref Range   Yeast Wet Prep HPF POC NONE SEEN NONE SEEN   Trich, Wet Prep NONE SEEN NONE SEEN   Clue Cells Wet Prep HPF POC NONE SEEN NONE SEEN   WBC, Wet Prep HPF POC FEW (A) NONE SEEN   Sperm NONE SEEN        IMAGING No results found.  MAU Management/MDM: Ordered labs and reviewed results.  Exam c/w primary HSV outbreak. Discussed with pt and questions answered.  Will treat with ibuprofen, topical lidocaine, and acyclovir.  Rx for Acyclovir for primary outbreak and for recurrences; ibuprofen, and lidocaine jelly.  Pt to f/u with primary care r/t HTN and with OB/Gyn as needed.   Pt stable at time of discharge.  ASSESSMENT 1. Primary vulvovaginal herpes simplex infection   2. Hypertension, unspecified type     PLAN Discharge home Allergies as of 03/02/2017      Reactions   Bactrim [sulfamethoxazole-trimethoprim] Anaphylaxis      Medication List    STOP taking these medications   metroNIDAZOLE 500 MG tablet Commonly known as:  FLAGYL     TAKE these medications   acetaminophen 500 MG tablet Commonly known as:  TYLENOL Take 1,000 mg by mouth every 6 (six) hours as needed for headache.   acyclovir 200 MG capsule Commonly known as:  ZOVIRAX Take 2 capsules (400 mg total) by mouth 3 (three) times daily.   acyclovir 200 MG capsule Commonly known as:  ZOVIRAX Take 4 capsules (800 mg total) by mouth 2 (two) times daily.    diphenhydrAMINE 25 MG tablet Commonly known as:  BENADRYL Take 1 tablet (25 mg total) by mouth every 6 (six) hours as needed.   hydrochlorothiazide 12.5 MG tablet Commonly known as:  HYDRODIURIL Take 1 tablet (12.5 mg total) by mouth daily.   ibuprofen 600 MG tablet Commonly known as:  ADVIL,MOTRIN Take 1 tablet (600 mg total) by mouth every 6 (six) hours as needed. What changed:  medication strength  how much to take  reasons to take this   lidocaine 2 % jelly Commonly known as:  XYLOCAINE Apply 1 application topically once.      Follow-up Information  Loletta SpecterGomez, Roger David, PA-C Follow up.   Specialty:  Physician Assistant Contact information: [place] [place] [id number] [phone]        [facility].   Specialty:  Obstetrics and Gynecology Why:  Or Gyn provider of your choice, return to MAU as needed for emergencies. Contact information: [address] [place] [id number] [phone]          Sharen CounterLisa Leftwich-Kirby Certified Nurse-Midwife 03/02/2017  6:36 AM

## 2017-03-03 LAB — HERPES SIMPLEX VIRUS(HSV) DNA BY PCR
HSV 1 DNA: NEGATIVE
HSV 2 DNA: NEGATIVE

## 2017-03-09 ENCOUNTER — Telehealth: Payer: Self-pay | Admitting: Advanced Practice Midwife

## 2017-03-09 NOTE — Telephone Encounter (Signed)
Called pt to inform her about negative HSV swab.  Pt reports improved but not completely resolved symptoms.  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  Recommend pt follow up with Gyn care if symptoms persist.

## 2017-05-17 ENCOUNTER — Telehealth (INDEPENDENT_AMBULATORY_CARE_PROVIDER_SITE_OTHER): Payer: Self-pay | Admitting: Physician Assistant

## 2017-05-17 NOTE — Telephone Encounter (Signed)
Patient Mother calling requesting a refill for hydrochlorothiazide (HYDRODIURIL) 12.5 MG  . [facility]  Please, call her when her medicine is switch .

## 2017-05-17 NOTE — Telephone Encounter (Signed)
Pt prescribed HCTZ 90 tablets with one refill on 12/23/16 which is six months worth. She was also instructed to f/u on or around 01/23/17 but pt did not. I will need to see patient in clinic to see if HCTZ is controlling blood pressure.

## 2017-05-17 NOTE — Telephone Encounter (Signed)
FWD to PCP.  Patient wants Rx sent to [facility]. Maryjean Mornempestt S Roberts, CMA

## 2017-05-18 NOTE — Telephone Encounter (Signed)
Patient is aware no Rx will be given until she is seen in clinic to determine if HCTZ is controlling her blood pressure, patient expressed understanding and has scheduled appointment for 8/24. Maryjean Mornempestt S Roberts, CMA

## 2017-05-27 ENCOUNTER — Ambulatory Visit (INDEPENDENT_AMBULATORY_CARE_PROVIDER_SITE_OTHER): Payer: Self-pay | Admitting: Physician Assistant

## 2017-05-27 ENCOUNTER — Encounter (INDEPENDENT_AMBULATORY_CARE_PROVIDER_SITE_OTHER): Payer: Self-pay | Admitting: Physician Assistant

## 2017-05-27 VITALS — BP 143/99 | HR 97 | Temp 98.3°F | Wt 298.8 lb

## 2017-05-27 DIAGNOSIS — Z91199 Patient's noncompliance with other medical treatment and regimen due to unspecified reason: Secondary | ICD-10-CM

## 2017-05-27 DIAGNOSIS — Z9119 Patient's noncompliance with other medical treatment and regimen: Secondary | ICD-10-CM

## 2017-05-27 DIAGNOSIS — I10 Essential (primary) hypertension: Secondary | ICD-10-CM

## 2017-05-27 DIAGNOSIS — E669 Obesity, unspecified: Secondary | ICD-10-CM

## 2017-05-27 MED ORDER — HYDROCHLOROTHIAZIDE 25 MG PO TABS: 25.0000 mg | ORAL_TABLET | Freq: Every day | ORAL | 0 refills | Status: DC

## 2017-05-27 NOTE — Patient Instructions (Signed)

## 2017-05-27 NOTE — Progress Notes (Signed)
Subjective:  Patient ID: Laura Gutierrez, female    DOB: 20-Jul-1986  Age: 31 y.o. MRN: [id number]  CC: htn  HPI Laura Gutierrez is a 31 y.o. female with a medical history of  HTN presents to f/u on HTN. Last seen here on 12/23/16. BP 140/97 at the time. BP 143/99 today. Had been advised to f/u in 4 weeks but failed to show. Did not get blood work done. Has been out of her HCTZ 12.5 mg for almost one month. Did not monitor BP outside of clinic. Patient does not endorse CP, palpitations, SOB, HA, abdominal pain, swelling, presyncnope, syncope, rash , or GI/GU sxs.      Outpatient Medications Prior to Visit  Medication Sig Dispense Refill  . acetaminophen (TYLENOL) 500 MG tablet Take 1,000 mg by mouth every 6 (six) hours as needed for headache.    . diphenhydrAMINE (BENADRYL) 25 MG tablet Take 1 tablet (25 mg total) by mouth every 6 (six) hours as needed. (Patient not taking: Reported on 05/27/2017) 30 tablet 0  . hydrochlorothiazide (HYDRODIURIL) 12.5 MG tablet Take 1 tablet (12.5 mg total) by mouth daily. (Patient not taking: Reported on 05/27/2017) 90 tablet 1  . ibuprofen (ADVIL,MOTRIN) 600 MG tablet Take 1 tablet (600 mg total) by mouth every 6 (six) hours as needed. (Patient not taking: Reported on 05/27/2017) 30 tablet 0   No facility-administered medications prior to visit.      ROS Review of Systems  Constitutional: Negative for chills, fever and malaise/fatigue.  Eyes: Negative for blurred vision.  Respiratory: Negative for shortness of breath.   Cardiovascular: Negative for chest pain and palpitations.  Gastrointestinal: Negative for abdominal pain and nausea.  Genitourinary: Negative for dysuria and hematuria.  Musculoskeletal: Negative for joint pain and myalgias.  Skin: Negative for rash.  Neurological: Negative for tingling and headaches.  Psychiatric/Behavioral: Negative for depression. The patient is not nervous/anxious.     Objective:  BP (!) 143/99 (BP Location: Right  Arm, Patient Position: Sitting, Cuff Size: Large)   Pulse 97   Temp 98.3 F (36.8 C) (Oral)   Wt 298 lb 12.8 oz (135.5 kg)   LMP 05/23/2017 (Exact Date)   SpO2 93%   BMI 48.23 kg/m   BP/Weight 05/27/2017 03/02/2017 02/10/2017  Systolic BP 143 166 137  Diastolic BP 99 92 97  Wt. (Lbs) 298.8 315.75 321.75  BMI 48.23 50.96 51.93      Physical Exam  Constitutional: She is oriented to person, place, and time.  Well developed, morbidly obese, NAD, polite  HENT:  Head: Normocephalic and atraumatic.  Eyes: No scleral icterus.  Neck: Normal range of motion. Neck supple. No thyromegaly present.  Cardiovascular: Normal rate, regular rhythm and normal heart sounds.   Pulmonary/Chest: Effort normal and breath sounds normal.  Musculoskeletal: She exhibits no edema.  Neurological: She is alert and oriented to person, place, and time. No cranial nerve deficit. Coordination normal.  Skin: Skin is warm and dry. No rash noted. No erythema. No pallor.  Psychiatric: She has a normal mood and affect. Her behavior is normal. Thought content normal.  Vitals reviewed.    Assessment & Plan:    1. Hypertension, unspecified type - Increase hydrochlorothiazide (HYDRODIURIL) 25 MG tablet; Take 1 tablet (25 mg total) by mouth daily. Take on tablet in the morning.  Dispense: 90 tablet; Refill: 0  2. Obesity, unspecified classification, unspecified obesity type, unspecified whether serious comorbidity present - TSH - Comprehensive metabolic panel  3. Noncompliance   Meds ordered  this encounter  Medications  . hydrochlorothiazide (HYDRODIURIL) 25 MG tablet    Sig: Take 1 tablet (25 mg total) by mouth daily. Take on tablet in the morning.    Dispense:  90 tablet    Refill:  0    Order Specific Question:   Supervising Provider    Answer:   Quentin Angst [id number]    Follow-up: Return in about 4 weeks (around 06/24/2017) for HTN.   Loletta Specter PA

## 2017-05-28 LAB — COMPREHENSIVE METABOLIC PANEL
A/G RATIO: 1.3 (ref 1.2–2.2)
ALBUMIN: 4 g/dL (ref 3.5–5.5)
ALT: 13 IU/L (ref 0–32)
AST: 15 IU/L (ref 0–40)
Alkaline Phosphatase: 98 IU/L (ref 39–117)
BILIRUBIN TOTAL: 0.3 mg/dL (ref 0.0–1.2)
BUN / CREAT RATIO: 15 (ref 9–23)
BUN: 14 mg/dL (ref 6–20)
CHLORIDE: 105 mmol/L (ref 96–106)
CO2: 21 mmol/L (ref 20–29)
Calcium: 8.5 mg/dL — ABNORMAL LOW (ref 8.7–10.2)
Creatinine, Ser: 0.92 mg/dL (ref 0.57–1.00)
GFR calc non Af Amer: 84 mL/min/{1.73_m2} (ref >59)
GFR, EST AFRICAN AMERICAN: 97 mL/min/{1.73_m2} (ref >59)
GLOBULIN, TOTAL: 3.1 g/dL (ref 1.5–4.5)
Glucose: 98 mg/dL (ref 65–99)
POTASSIUM: 3.9 mmol/L (ref 3.5–5.2)
Sodium: 139 mmol/L (ref 134–144)
TOTAL PROTEIN: 7.1 g/dL (ref 6.0–8.5)

## 2017-05-28 LAB — TSH: TSH: 2.21 u[IU]/mL (ref 0.450–4.500)

## 2017-05-31 ENCOUNTER — Encounter (INDEPENDENT_AMBULATORY_CARE_PROVIDER_SITE_OTHER): Payer: Self-pay

## 2017-06-03 MED FILL — HYDROCHLOROTHIAZIDE 25 MG T: 25 | 30 days supply | Qty: 30 | Fill #0

## 2017-06-20 ENCOUNTER — Ambulatory Visit (INDEPENDENT_AMBULATORY_CARE_PROVIDER_SITE_OTHER): Payer: Self-pay | Admitting: Physician Assistant

## 2017-06-24 ENCOUNTER — Ambulatory Visit (INDEPENDENT_AMBULATORY_CARE_PROVIDER_SITE_OTHER): Payer: Self-pay | Admitting: Physician Assistant

## 2017-06-24 ENCOUNTER — Encounter (INDEPENDENT_AMBULATORY_CARE_PROVIDER_SITE_OTHER): Payer: Self-pay | Admitting: Physician Assistant

## 2017-06-24 ENCOUNTER — Telehealth (INDEPENDENT_AMBULATORY_CARE_PROVIDER_SITE_OTHER): Payer: Self-pay | Admitting: Physician Assistant

## 2017-06-24 VITALS — BP 143/101 | HR 105 | Temp 98.5°F | Wt 295.4 lb

## 2017-06-24 DIAGNOSIS — J069 Acute upper respiratory infection, unspecified: Secondary | ICD-10-CM

## 2017-06-24 DIAGNOSIS — B360 Pityriasis versicolor: Secondary | ICD-10-CM

## 2017-06-24 MED ORDER — PHENYLEPHRINE-DM-GG-APAP 5-10-200-325 MG/10ML PO LIQD: 20.0000 mL | ORAL | 0 refills | Status: AC

## 2017-06-24 MED ORDER — AZITHROMYCIN 250 MG PO TABS: ORAL_TABLET | ORAL | 0 refills | Status: DC

## 2017-06-24 MED ORDER — FLUCONAZOLE 150 MG PO TABS: 300.0000 mg | ORAL_TABLET | ORAL | 0 refills | Status: DC

## 2017-06-24 MED FILL — AZITHROMYCIN 250 MG TABLET: 250 | 6 days supply | Qty: 6 | Fill #0

## 2017-06-24 MED FILL — FLUCONAZOLE 150 MG TABLET: 150 | 14 days supply | Qty: 4 | Fill #0

## 2017-06-24 NOTE — Progress Notes (Signed)
Subjective:  Patient ID: Laura Gutierrez, female    DOB: 02-22-1986  Age: 31 y.o. MRN: [id number]  CC: URI  HPI Laura Gutierrez is a 31 y.o. female with a medical history of Obesity and Gonorrhea presents with 5 day history of cough, nasal congestion, sore throat, and malaise. No close contacts with the same. Has not taken anything for relief. Does not endorse ear pain, eye pain, sinus pain, body aches, rash, swelling, chest pain, SOB, HA, abdominal pain, or GI/GU sxs.    Also states she has noticed multiple, ovoid, hypopigmented macules on her face. Did not notice until a family member told her.       Outpatient Medications Prior to Visit  Medication Sig Dispense Refill  . hydrochlorothiazide (HYDRODIURIL) 25 MG tablet Take 1 tablet (25 mg total) by mouth daily. Take on tablet in the morning. 90 tablet 0  . acetaminophen (TYLENOL) 500 MG tablet Take 1,000 mg by mouth every 6 (six) hours as needed for headache.    . diphenhydrAMINE (BENADRYL) 25 MG tablet Take 1 tablet (25 mg total) by mouth every 6 (six) hours as needed. (Patient not taking: Reported on 05/27/2017) 30 tablet 0  . ibuprofen (ADVIL,MOTRIN) 600 MG tablet Take 1 tablet (600 mg total) by mouth every 6 (six) hours as needed. (Patient not taking: Reported on 05/27/2017) 30 tablet 0   No facility-administered medications prior to visit.      ROS Review of Systems  Constitutional: Negative for chills, fever and malaise/fatigue.  HENT: Positive for congestion and sore throat. Negative for ear pain, nosebleeds and sinus pain.   Eyes: Negative for blurred vision.  Respiratory: Negative for shortness of breath.   Cardiovascular: Negative for chest pain and palpitations.  Gastrointestinal: Negative for abdominal pain and nausea.  Genitourinary: Negative for dysuria and hematuria.  Musculoskeletal: Negative for joint pain and myalgias.  Skin: Negative for rash.  Neurological: Negative for tingling and headaches.   Psychiatric/Behavioral: Negative for depression. The patient is not nervous/anxious.     Objective:  BP (!) 143/101 (BP Location: Right Arm, Patient Position: Sitting, Cuff Size: Large)   Pulse (!) 105   Temp 98.5 F (36.9 C) (Oral)   Wt 295 lb 6.4 oz (134 kg)   LMP 06/20/2017   SpO2 94%   BMI 47.68 kg/m   BP/Weight 06/24/2017 05/27/2017 03/02/2017  Systolic BP 143 143 166  Diastolic BP 101 99 92  Wt. (Lbs) 295.4 298.8 315.75  BMI 47.68 48.23 50.96      Physical Exam  Constitutional: She is oriented to person, place, and time.  Well developed, obese, NAD, polite  HENT:  Head: Normocephalic and atraumatic.  Mouth/Throat: No oropharyngeal exudate.  TMs normal. Turbinates mildly erythematous and moderately hypertrophic.   Eyes: Conjunctivae are normal. No scleral icterus.  Neck: Normal range of motion. Neck supple.  Cardiovascular: Normal rate, regular rhythm and normal heart sounds.   Pulmonary/Chest: Effort normal and breath sounds normal.  Musculoskeletal: She exhibits no edema.  Lymphadenopathy:    She has no cervical adenopathy.  Neurological: She is alert and oriented to person, place, and time. No cranial nerve deficit. Coordination normal.  Skin: Skin is warm and dry.  multiple, ovoid, hypopigmented macules on face  Psychiatric: She has a normal mood and affect. Her behavior is normal. Thought content normal.  Vitals reviewed.    Assessment & Plan:    1. Acute upper respiratory infection - Phenylephrine-DM-GG-APAP (MUCINEX FAST-MAX COLD FLU) 5-10-200-325 MG/10ML LIQD; Take 20 mLs by  mouth every 4 (four) hours.  Dispense: 1 Bottle; Refill: 0 - azithromycin (ZITHROMAX) 250 MG tablet; Two tablets on first day then one tablet everyday thereafter.  Dispense: 6 tablet; Refill: 0  2. Tinea versicolor - fluconazole (DIFLUCAN) 150 MG tablet; Take 2 tablets (300 mg total) by mouth once a week.  Dispense: 4 tablet; Refill: 0   Meds ordered this encounter  Medications   . Phenylephrine-DM-GG-APAP (MUCINEX FAST-MAX COLD FLU) 5-10-200-325 MG/10ML LIQD    Sig: Take 20 mLs by mouth every 4 (four) hours.    Dispense:  1 Bottle    Refill:  0    Order Specific Question:   Supervising Provider    Answer:   Quentin Angst [id number]  . azithromycin (ZITHROMAX) 250 MG tablet    Sig: Two tablets on first day then one tablet everyday thereafter.    Dispense:  6 tablet    Refill:  0    Order Specific Question:   Supervising Provider    Answer:   Quentin Angst [id number]  . fluconazole (DIFLUCAN) 150 MG tablet    Sig: Take 2 tablets (300 mg total) by mouth once a week.    Dispense:  4 tablet    Refill:  0    Order Specific Question:   Supervising Provider    Answer:   Quentin Angst [id number]    Follow-up: Return if symptoms worsen or fail to improve.   Loletta Specter PA

## 2017-06-24 NOTE — Progress Notes (Signed)
Pt states she began having a sore throat since last Friday She now has a cough and is sneezing She states that some days her voice is horse Pt denies any pain

## 2017-06-24 NOTE — Patient Instructions (Signed)
Tinea Versicolor Tinea versicolor is a common fungal infection of the skin. It causes a rash that appears as light or dark patches on the skin. The rash most often occurs on the chest, back, neck, or upper arms. This condition is more common during warm weather. Other than affecting how your skin looks, tinea versicolor usually does not cause other problems. In most cases, the infection goes away in a few weeks with treatment. It may take a few months for the patches on your skin to clear up. What are the causes? Tinea versicolor occurs when a type of fungus that is normally present on the skin starts to overgrow. This fungus is a kind of yeast. The exact cause of the overgrowth is not known. This condition cannot be passed from one person to another (noncontagious). What increases the risk? This condition is more likely to develop when certain factors are present, such as:  Heat and humidity.  Sweating too much.  Hormone changes.  Oily skin.  A weak defense (immune) system.  What are the signs or symptoms? Symptoms of this condition may include:  A rash on your skin that is made up of light or dark patches. The rash may have: ? Patches of tan or pink spots on light skin. ? Patches of white or brown spots on dark skin. ? Patches of skin that do not tan. ? Well-marked edges. ? Scales on the discolored areas.  Mild itching.  How is this diagnosed? A health care provider can usually diagnose this condition by looking at your skin. During the exam, he or she may use ultraviolet light to help determine the extent of the infection. In some cases, a skin sample may be taken by scraping the rash. This sample will be viewed under a microscope to check for yeast overgrowth. How is this treated? Treatment for this condition may include:  Dandruff shampoo that is applied to the affected skin during showers or bathing.  Over-the-counter medicated skin cream, lotion, or soaps.  Prescription  antifungal medicine in the form of skin cream or pills.  Medicine to help reduce itching.  Follow these instructions at home:  Take medicines only as directed by your health care provider.  Apply dandruff shampoo to the affected area if told to do so by your health care provider. You may be instructed to scrub the affected skin for several minutes each day.  Do not scratch the affected area of skin.  Avoid hot and humid conditions.  Do not use tanning booths.  Try to avoid sweating a lot. Contact a health care provider if:  Your symptoms get worse.  You have a fever.  You have redness, swelling, or pain at the site of your rash.  You have fluid, blood, or pus coming from your rash.  Your rash returns after treatment. This information is not intended to replace advice given to you by your health care provider. Make sure you discuss any questions you have with your health care provider. Document Released: 09/17/2000 Document Revised: 05/23/2016 Document Reviewed: 07/02/2014 Elsevier Interactive Patient Education  2018 [employer]  

## 2017-06-24 NOTE — Telephone Encounter (Signed)
Pt came to the office today and was told she will get these med

## 2017-07-05 ENCOUNTER — Encounter (HOSPITAL_COMMUNITY): Payer: Self-pay | Admitting: Emergency Medicine

## 2017-07-05 ENCOUNTER — Emergency Department (HOSPITAL_COMMUNITY): Payer: Self-pay

## 2017-07-05 ENCOUNTER — Emergency Department (HOSPITAL_COMMUNITY)
Admission: EM | Admit: 2017-07-05 | Discharge: 2017-07-05 | Disposition: A | Discharge status: Home / Self Care | Payer: Self-pay | Attending: Emergency Medicine | Admitting: Emergency Medicine

## 2017-07-05 DIAGNOSIS — I10 Essential (primary) hypertension: Secondary | ICD-10-CM | POA: Insufficient documentation

## 2017-07-05 DIAGNOSIS — J029 Acute pharyngitis, unspecified: Secondary | ICD-10-CM | POA: Insufficient documentation

## 2017-07-05 DIAGNOSIS — R05 Cough: Secondary | ICD-10-CM | POA: Insufficient documentation

## 2017-07-05 DIAGNOSIS — Z6841 Body Mass Index (BMI) 40.0 and over, adult: Secondary | ICD-10-CM | POA: Insufficient documentation

## 2017-07-05 DIAGNOSIS — E669 Obesity, unspecified: Secondary | ICD-10-CM | POA: Insufficient documentation

## 2017-07-05 DIAGNOSIS — L089 Local infection of the skin and subcutaneous tissue, unspecified: Secondary | ICD-10-CM | POA: Insufficient documentation

## 2017-07-05 HISTORY — DX: Essential (primary) hypertension: I10

## 2017-07-05 MED ORDER — IBUPROFEN 800 MG PO TABS
800.0000 mg | ORAL_TABLET | Freq: Once | ORAL | Status: AC
  Administered 2017-07-05: 800 mg via ORAL
  Filled 2017-07-05: qty 1

## 2017-07-05 MED ORDER — DOXYCYCLINE HYCLATE 100 MG PO CAPS: 100.0000 mg | ORAL_CAPSULE | Freq: Two times a day (BID) | ORAL | 0 refills | Status: AC

## 2017-07-05 MED ORDER — CIPROFLOXACIN HCL 500 MG PO TABS: 500.0000 mg | ORAL_TABLET | Freq: Two times a day (BID) | ORAL | 0 refills | Status: AC

## 2017-07-05 NOTE — Discharge Instructions (Signed)
You have an infection between the first and second toes on the right foot. X-rays did not show evidence that this infection has spread to the bone.   It is very important that you take the antibiotics which have been prescribed to you for this infection.  Please take ciprofloxacin twice a day for the next 10 days and also take doxycycline twice a day for the next 10 days.Please also schedule a follow-up appointment with Dr. Logan Bores who is a foot doctor. Call today to schedule this appointment so that he can see him in the next week.  Please soak your foot in warm soapy water  at least once a day,  dry the foot completely and place gauze between the 2 toes. Please avoid wearing closed toed shoes and socks while this infection is clearing.  Please take all of your antibiotics until finished!   You may develop abdominal discomfort or diarrhea from the antibiotic.  You may help offset this with probiotics which you can buy or get in yogurt. Do not eat  or take the probiotics until 2 hours after your antibiotic.   You can take ibuprofen for your pain. Please take 800 mg every 6 hours as needed for pain.  Please return to the emergency department if you develop a fever, have pain/swelling that spreads up the foot, have worsening quantity of pus despite antibiotics. Please return to the emergency department for any new or worsening symptoms.  Your blood pressure was also mildly elevated while in the ER today.  Please schedule a follow-up appointment with your primary doctor for further management of your blood pressure.

## 2017-07-05 NOTE — ED Provider Notes (Signed)
[facility]-EMERGENCY DEPT Provider Note   CSN: [id number] Arrival date & time: 07/05/17  1112     History   Chief Complaint Chief Complaint  Patient presents with  . Foot Pain  . Cough  . Sore Throat    HPI Laura Gutierrez Gutierrez a 31 y.o. female.  HPI   Laura Gutierrez a 31 year old female with a medical history of hypertension who presents to the emergency department with multiple complaints. Patient states that she popped a blister between her right first and second toes about a week ago, and since that time has had white purulent pus coming out between the 2 toes. She states she thinks it Gutierrez "infected." She states that it has a bad odor. She states that she has had athlete's foot for several years now and applied antifungal cream over the area. She states that she has 10/10 throbbing pain between her first and second toes when she walks. She denies history of diabetes, does not take immunosuppressant drugs.  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  Patient also states that she has had cough, congestion, sore throat for the past 2 weeks now. She went to her primary doctor's office and he told her that she likely had a viral upper respiratory tract infection, but gave her antibiotics. If that she took the full course of antibiotics and still has residual symptoms. Her cough Gutierrez not productive. She states that she can feel postnasal drip. Her sore throat Gutierrez a 3/10 in severity, worsened with swallowing. She has no known sick contacts. No fever, chest pain, shortness of breath, ear pain, ear drainage.  Past Medical History:  Diagnosis Date  . History of gonorrhea 11/2009&10/2011   + GC CULTURE  . Hypertension   . Obesity   . STD (sexually transmitted disease)    Gonorrhea Hx    Patient Active Problem List   Diagnosis Date Noted  . Hypertension 12/23/2016  . Obesity with serious comorbidity  12/23/2016    Past Surgical History:  Procedure Laterality Date  . TONSILLECTOMY     AGE 61  . TONSILLECTOMY      OB History    Gravida Para Term Preterm AB Living   0             SAB TAB Ectopic Multiple Live Births                   Home Medications    Prior to Admission medications   Medication Sig Start Date End Date Taking? Authorizing Provider  Chlorphen-Phenyleph-APAP (CORICIDIN D COLD/FLU/SINUS) 2-5-325 MG TABS Take 1 tablet by mouth every 6 (six) hours as needed (cold).   Yes [provider]  hydrochlorothiazide (HYDRODIURIL) 25 MG tablet Take 1 tablet (25 mg total) by mouth daily. Take on tablet in the morning. 05/27/17  Yes Loletta Specter, PA-C  ibuprofen (ADVIL,MOTRIN) 200 MG tablet Take 400 mg by mouth every 6 (six) hours as needed for moderate pain.   Yes [provider]  azithromycin (ZITHROMAX) 250 MG tablet Two tablets on first day then one tablet everyday thereafter. Patient not taking: Reported on 07/05/2017 06/24/17   Loletta Specter, PA-C  ciprofloxacin (CIPRO) 500 MG tablet Take 1 tablet (500 mg total) by mouth every 12 (twelve) hours. 07/05/17 07/15/17  Kellie Shropshire, PA-C  doxycycline (VIBRAMYCIN) 100 MG capsule Take 1 capsule (100 mg total)  by mouth 2 (two) times daily. 07/05/17 07/15/17  Kellie Shropshire, PA-C  fluconazole (DIFLUCAN) 150 MG tablet Take 2 tablets (300 mg total) by mouth once a week. Patient not taking: Reported on 07/05/2017 06/24/17   Loletta Specter, PA-C    Family History Family History  Problem Relation Age of Onset  . Hypertension Mother   . Alzheimer's disease Maternal Aunt   . Heart disease Maternal Grandfather   . Diabetes Paternal Grandmother     Social History Social History  Substance Use Topics  . Smoking status: Current Every Day Smoker    Packs/day: 0.25    Types: Cigarettes  . Smokeless tobacco: Never Used  . Alcohol use 0.0 oz/week     Comment: sometimes       Allergies     Bactrim [sulfamethoxazole-trimethoprim]   Review of Systems Review of Systems  Constitutional: Negative for chills, fatigue and fever.  HENT: Positive for congestion, postnasal drip, rhinorrhea and sore throat. Negative for ear discharge, ear pain and trouble swallowing.   Respiratory: Positive for cough. Negative for shortness of breath.   Cardiovascular: Negative for chest pain.  Gastrointestinal: Negative for abdominal pain, nausea and vomiting.  Musculoskeletal: Positive for arthralgias (toe pain). Negative for gait problem and joint swelling.  Skin: Positive for wound.  Neurological: Negative for weakness and numbness.     Physical Exam Updated Vital Signs BP (!) 139/101 (BP Location: Left Arm) Comment: did not take her mwedication today  Pulse 99   Temp 98 F (36.7 C) (Oral)   Resp 20   Wt 133.8 kg (295 lb)   LMP 06/20/2017 (Exact Date)   SpO2 100%   BMI 47.61 kg/m   Physical Exam  Constitutional: She Gutierrez oriented to person, place, and time. She appears well-developed and well-nourished. No distress.  HENT:  Head: Normocephalic and atraumatic.  Mouth/Throat: Oropharynx Gutierrez clear and moist. No oropharyngeal exudate.  Clear rhinorrhea noted in the nasal cavity. Bilateral TMs with good cone of light.  Eyes: Pupils are equal, round, and reactive to light. Conjunctivae are normal. Right eye exhibits no discharge. Left eye exhibits no discharge.  Neck: Normal range of motion. Neck supple.  Cardiovascular: Normal rate and regular rhythm.  Exam reveals no friction rub.   No murmur heard. Pulmonary/Chest: Effort normal and breath sounds normal. No respiratory distress. She has no wheezes. She has no rales.  Musculoskeletal: Normal range of motion.  Tenderness was separating the first and second toes of the right foot. Full active ROM of the toes, ankle, knee bilaterally. DP and PT pulses 2+ bilaterally.  Lymphadenopathy:    She has no cervical adenopathy.  Neurological: She  Gutierrez alert and oriented to person, place, and time.  Distal sensation intact to light touch in bilateral lower extremities. Gait normal, good balance.  Skin: Skin Gutierrez warm and dry. Capillary refill takes less than 2 seconds. She Gutierrez not diaphoretic.  Significant purulence noted between the first and second toes of the right foot. Skin breakdown noted. See picture below.  Psychiatric: She has a normal mood and affect. Her behavior Gutierrez normal.  Nursing note and vitals reviewed.             ED Treatments / Results  Labs (all labs ordered are listed, but only abnormal results are displayed) Labs Reviewed - No data to display  EKG  EKG Interpretation None       Radiology Dg Foot Complete Right  Result Date: 07/05/2017 CLINICAL DATA:  Patient  reports a blister that Gutierrez ruptured between the first and second toes and knees persistently draining foul smelling fluid. The patient also Gutierrez a reports swelling of the first through third toes. EXAM: RIGHT FOOT COMPLETE - 3+ VIEW COMPARISON:  None in PACs FINDINGS: The bones are subjectively adequately mineralized. No soft tissue gas collections are observed. There are no soft tissue calcifications. There Gutierrez soft tissue swelling over the midfoot and forefoot dorsally. IMPRESSION: Findings compatible with cellulitis of the forefoot without evidence of osteomyelitis. Electronically Signed   By: David  [place] M.D.   On: 07/05/2017 13:13    Procedures Procedures (including critical care time)  Medications Ordered in ED Medications  ibuprofen (ADVIL,MOTRIN) tablet 800 mg (800 mg Oral Given 07/05/17 1347)     Initial Impression / Assessment and Plan / ED Course  I have reviewed the triage vital signs and the nursing notes.  Pertinent labs & imaging results that were available during my care of the patient were reviewed by me and considered in my medical decision making (see chart for details).     Patient with toe web infection. X-rays of the  right foot show no evidence of osteomyelitis. Will treat with doxycycline and ciprofloxacin. Have counseled patient to follow up with podiatry. Have also counseled patient extensively on wound care including soaks with warm soapy water daily, cleaning out any pus and keeping the area dry. Have also sent her home with supplies to do this. Have discussed strict return precautions including fever, spreading of the infection up the foot or to neighboring toes despite using antibiotics. Patient Gutierrez afebrile and vital signs stable.  In terms of the patient's ongoing congestion, cough have discussed that this Gutierrez a viral upper respiratory tract infection. Lungs clear to auscultation, in no respiratory distress. Given exam, do not suspect pneumonia requiring chest x-ray. Have counseled patient on symptomatic treatment for upper respiratory tract infection and patient voiced understanding and agrees.  She was mildly hypertensive in the ER today. Have counseled her to follow up at her primary care office for recheck. Patient agrees to above plan and voiced understanding. Discussed this patient with Dr. Criss Alvine who agrees with plan.  Final Clinical Impressions(s) / ED Diagnoses   Final diagnoses:  Infection of toe web    New Prescriptions Discharge Medication List as of 07/05/2017  2:38 PM    START taking these medications   Details  ciprofloxacin (CIPRO) 500 MG tablet Take 1 tablet (500 mg total) by mouth every 12 (twelve) hours., Starting Tue 07/05/2017, Until Fri 07/15/2017, Print    doxycycline (VIBRAMYCIN) 100 MG capsule Take 1 capsule (100 mg total) by mouth 2 (two) times daily., Starting Tue 07/05/2017, Until Fri 07/15/2017, Print         Kellie Shropshire, PA-C 07/05/17 2359    Pricilla Loveless, MD 07/06/17 [phone]

## 2017-07-05 NOTE — ED Notes (Signed)
Bed: WTR6 Expected date:  Expected time:  Means of arrival:  Comments: 

## 2017-07-05 NOTE — ED Triage Notes (Signed)
Pt reports 1 week hx of blister between the 1st and second toe of r/foot. Stated that the blister burst. Cloudy, foul odor drainage continues to drain. 3rd toe is swollen

## 2017-07-19 ENCOUNTER — Ambulatory Visit: Payer: PRIVATE HEALTH INSURANCE | Admitting: Podiatry

## 2017-11-15 ENCOUNTER — Emergency Department (HOSPITAL_COMMUNITY)
Admission: EM | Admit: 2017-11-15 | Discharge: 2017-11-16 | Disposition: A | Discharge status: Home / Self Care | Payer: Self-pay | Attending: Emergency Medicine | Admitting: Emergency Medicine

## 2017-11-15 ENCOUNTER — Other Ambulatory Visit: Payer: Self-pay

## 2017-11-15 ENCOUNTER — Encounter (HOSPITAL_COMMUNITY): Payer: Self-pay | Admitting: *Deleted

## 2017-11-15 DIAGNOSIS — R05 Cough: Secondary | ICD-10-CM | POA: Insufficient documentation

## 2017-11-15 DIAGNOSIS — J209 Acute bronchitis, unspecified: Secondary | ICD-10-CM

## 2017-11-15 DIAGNOSIS — R69 Illness, unspecified: Secondary | ICD-10-CM

## 2017-11-15 DIAGNOSIS — J111 Influenza due to unidentified influenza virus with other respiratory manifestations: Secondary | ICD-10-CM

## 2017-11-15 DIAGNOSIS — F17210 Nicotine dependence, cigarettes, uncomplicated: Secondary | ICD-10-CM | POA: Insufficient documentation

## 2017-11-15 HISTORY — DX: Gonococcal infection, unspecified: A54.9

## 2017-11-15 MED ORDER — ALBUTEROL SULFATE HFA 108 (90 BASE) MCG/ACT IN AERS
2.0000 | INHALATION_SPRAY | RESPIRATORY_TRACT | Status: DC | PRN
  Administered 2017-11-16: 2 via RESPIRATORY_TRACT
  Filled 2017-11-15: qty 6.7

## 2017-11-15 NOTE — ED Triage Notes (Signed)
Started Friday with body aches, cough and head ache. Denies fever, No N/V./D

## 2017-11-15 NOTE — ED Provider Notes (Signed)
[facility]-EMERGENCY DEPT Provider Note: Laura Gutierrez Quenisha Lovins, MD, FACEP  CSN: [id number] MRN: [id number] ARRIVAL: 11/15/17 at 2003 ROOM: WA09/WA09   CHIEF COMPLAINT  Flu-Like Symptoms   HISTORY OF PRESENT ILLNESS  11/15/17 11:31 PM [name] is a 32 y.o. female with a 3-day history of flulike symptoms.  Specifically she has had body aches, nonproductive cough, headache worse with coughing, nasal congestion, sore throat, hoarse voice, shortness of breath and wheezing.  She denies nausea, vomiting, diarrhea or fever.  She has been taking over-the-counter Coricidin-D without adequate control of her cough.  She describes the cough is most concerning symptom.  She rates her body pain as an 8 out of 10.   She has some mild lower extremity edema but admits she has not been taking her hydrochlorothiazide as prescribed.   Past Medical History:  Diagnosis Date  . Gonorrhea   . Hypertension   . Obesity     Past Surgical History:  Procedure Laterality Date  . TONSILLECTOMY      Family History  Problem Relation Age of Onset  . Hypertension Mother   . Alzheimer's disease Maternal Aunt   . Heart disease Maternal Grandfather   . Diabetes Paternal Grandmother     Social History   Tobacco Use  . Smoking status: Current Every Day Smoker    Packs/day: 0.25    Types: Cigarettes  . Smokeless tobacco: Never Used  Substance Use Topics  . Alcohol use: Yes    Alcohol/week: 0.0 oz    Comment: sometimes    . Drug use: No    Prior to Admission medications   Medication Sig Start Date End Date Taking? Authorizing Provider  Chlorphen-Phenyleph-APAP (CORICIDIN D COLD/FLU/SINUS) 2-5-325 MG TABS Take 1 tablet by mouth every 6 (six) hours as needed (cold).   Yes [provider]  hydrochlorothiazide (HYDRODIURIL) 25 MG tablet Take 1 tablet (25 mg total) by mouth daily. Take on tablet in the morning. Patient taking differently: Take 25 mg by mouth daily.  05/27/17  Yes Loletta SpecterGomez, Roger David, PA-C    ibuprofen (ADVIL,MOTRIN) 200 MG tablet Take 400 mg by mouth every 6 (six) hours as needed for moderate pain.   Yes [provider]    Allergies Bactrim [sulfamethoxazole-trimethoprim]   REVIEW OF SYSTEMS  Negative except as noted here or in the History of Present Illness.   PHYSICAL EXAMINATION  Initial Vital Signs Blood pressure (!) 195/112, pulse 83, temperature 98.5 F (36.9 C), temperature source Oral, resp. rate 20, height 5\' 6"  (1.676 m), SpO2 100 %.  Examination General: Well-developed, well-nourished female in no acute distress; appearance consistent with age of record HENT: normocephalic; atraumatic; pharyngeal erythema without exudate; mild dysphonia; nasal congestion Eyes: pupils equal, round and reactive to light; extraocular muscles intact Neck: supple Heart: regular rate and rhythm Lungs: Expiratory wheezing Abdomen: soft; nondistended; nontender; bowel sounds present Extremities: No deformity; full range of motion; pulses normal; trace edema of lower legs Neurologic: Awake, alert and oriented; motor function intact in all extremities and symmetric; no facial droop Skin: Warm and dry Psychiatric: Normal mood and affect   RESULTS  Summary of this visit's results, reviewed by myself:   EKG Interpretation  Date/Time:    Ventricular Rate:    PR Interval:    QRS Duration:   QT Interval:    QTC Calculation:   R Axis:     Text Interpretation:        Laboratory Studies: No results found for this or any previous visit (  from the past 24 hour(s)). Imaging Studies: No results found.  ED COURSE  Nursing notes and initial vitals signs, including pulse oximetry, reviewed.  Vitals:   11/15/17 2037 11/15/17 2039 11/15/17 2253  BP: (!) 205/136  (!) 195/112  Pulse: 97  83  Resp: 18  20  Temp: 98.5 F (36.9 C)    TempSrc: Oral    SpO2: 97%  100%  Height:  5\' 6"  (1.676 m)    Patient's symptomatology consistent with flu or flulike syndrome.  She has  had sick contacts with similar symptoms.  We will provide albuterol inhaler for acute bronchitis symptoms.  PROCEDURES    ED DIAGNOSES     ICD-10-CM   1. Influenza-like illness R69   2. Acute bronchitis with bronchospasm J20.9        Parissa Chiao, MD 11/15/17 [phone]

## 2018-05-04 MED FILL — HYDROCHLOROTHIAZIDE 25 MG T: 25 | 30 days supply | Qty: 30 | Fill #1

## 2018-05-18 ENCOUNTER — Encounter (HOSPITAL_COMMUNITY): Payer: Self-pay

## 2018-05-18 ENCOUNTER — Emergency Department (HOSPITAL_COMMUNITY)
Admission: EM | Admit: 2018-05-18 | Discharge: 2018-05-18 | Disposition: A | Discharge status: Home / Self Care | Payer: Self-pay | Attending: Emergency Medicine | Admitting: Emergency Medicine

## 2018-05-18 DIAGNOSIS — N72 Inflammatory disease of cervix uteri: Secondary | ICD-10-CM | POA: Insufficient documentation

## 2018-05-18 DIAGNOSIS — F17210 Nicotine dependence, cigarettes, uncomplicated: Secondary | ICD-10-CM | POA: Insufficient documentation

## 2018-05-18 LAB — WET PREP, GENITAL
Clue Cells Wet Prep HPF POC: NONE SEEN
Sperm: NONE SEEN
Trich, Wet Prep: NONE SEEN
Yeast Wet Prep HPF POC: NONE SEEN

## 2018-05-18 LAB — GC/CHLAMYDIA PROBE AMP (~~LOC~~) NOT AT ARMC
CHLAMYDIA, DNA PROBE: NEGATIVE
NEISSERIA GONORRHEA: POSITIVE — AB

## 2018-05-18 LAB — POC URINE PREG, ED: PREG TEST UR: NEGATIVE

## 2018-05-18 LAB — HIV ANTIBODY (ROUTINE TESTING W REFLEX): HIV Screen 4th Generation wRfx: NONREACTIVE

## 2018-05-18 MED ORDER — STERILE WATER FOR INJECTION IJ SOLN
INTRAMUSCULAR | Status: AC
  Administered 2018-05-18: 10 mL
  Filled 2018-05-18: qty 10

## 2018-05-18 MED ORDER — AZITHROMYCIN 250 MG PO TABS
1000.0000 mg | ORAL_TABLET | Freq: Once | ORAL | Status: AC
  Administered 2018-05-18: 1000 mg via ORAL
  Filled 2018-05-18: qty 4

## 2018-05-18 MED ORDER — DOXYCYCLINE HYCLATE 100 MG PO CAPS: 100.0000 mg | ORAL_CAPSULE | Freq: Two times a day (BID) | ORAL | 0 refills | Status: DC

## 2018-05-18 MED ORDER — DOXYCYCLINE HYCLATE 100 MG PO CAPS: 100.0000 mg | ORAL_CAPSULE | Freq: Two times a day (BID) | ORAL | 0 refills | Status: AC

## 2018-05-18 MED ORDER — CEFTRIAXONE SODIUM 250 MG IJ SOLR
250.0000 mg | Freq: Once | INTRAMUSCULAR | Status: AC
  Administered 2018-05-18: 250 mg via INTRAMUSCULAR
  Filled 2018-05-18: qty 250

## 2018-05-18 NOTE — ED Triage Notes (Signed)
Pt has high blood pressure and hasn't taken her medication today

## 2018-05-18 NOTE — ED Triage Notes (Signed)
Pt complains of low pelvic pain for one week, she states she had a normal period and then started to spot again and have this pain, the spotting looks like brown end of period discharge

## 2018-05-18 NOTE — Discharge Instructions (Signed)
No sexual activity until 1 week post antibiotics. Return for fever, worsening pain.

## 2018-05-18 NOTE — ED Provider Notes (Signed)
[facility]-EMERGENCY DEPT Provider Note   CSN: [id number] Arrival date & time: 05/18/18  0128     History   Chief Complaint Chief Complaint  Patient presents with  . Pelvic Pain    HPI Laura Gutierrez is a 32 y.o. female.  32 yo F with a chief complaint of pelvic pain.  Going on for the past couple days.  She thought it was related with her menstrual cycle which has been on for the past week.  It resolved but now she is having spotting.  She describes it as a squeezing pain.  Denies radiation.  Denies vaginal discharge.  Denies dysuria increased frequency or hesitancy.  Denies flank pain.  Denies constipation or diarrhea.  The history is provided by the patient.  Pelvic Pain  This is a new problem. The current episode started 2 days ago. The problem occurs constantly. The problem has been gradually worsening. Pertinent negatives include no chest pain, no abdominal pain, no headaches and no shortness of breath. Nothing aggravates the symptoms. Nothing relieves the symptoms. She has tried nothing for the symptoms. The treatment provided no relief.    Past Medical History:  Diagnosis Date  . Gonorrhea   . Hypertension   . Obesity     Patient Active Problem List   Diagnosis Date Noted  . Hypertension 12/23/2016  . Obesity with serious comorbidity 12/23/2016    Past Surgical History:  Procedure Laterality Date  . TONSILLECTOMY       OB History    Gravida  0   Para      Term      Preterm      AB      Living        SAB      TAB      Ectopic      Multiple      Live Births               Home Medications    Prior to Admission medications   Medication Sig Start Date End Date Taking? Authorizing Provider  Chlorphen-Phenyleph-APAP (CORICIDIN D COLD/FLU/SINUS) 2-5-325 MG TABS Take 1 tablet by mouth every 6 (six) hours as needed (cold).    [provider]  doxycycline (VIBRAMYCIN) 100 MG capsule Take 1 capsule (100 mg total)  by mouth 2 (two) times daily for 14 days. One po bid x 7 days 05/18/18 06/01/18  Melene Plan, DO  hydrochlorothiazide (HYDRODIURIL) 25 MG tablet Take 1 tablet (25 mg total) by mouth daily. Take on tablet in the morning. Patient taking differently: Take 25 mg by mouth daily.  05/27/17   Loletta Specter, PA-C  ibuprofen (ADVIL,MOTRIN) 200 MG tablet Take 400 mg by mouth every 6 (six) hours as needed for moderate pain.    [provider]    Family History Family History  Problem Relation Age of Onset  . Hypertension Mother   . Alzheimer's disease Maternal Aunt   . Heart disease Maternal Grandfather   . Diabetes Paternal Grandmother     Social History Social History   Tobacco Use  . Smoking status: Current Every Day Smoker    Packs/day: 0.25    Types: Cigarettes  . Smokeless tobacco: Never Used  Substance Use Topics  . Alcohol use: Yes    Alcohol/week: 0.0 standard drinks    Comment: sometimes    . Drug use: No     Allergies   Bactrim [sulfamethoxazole-trimethoprim]   Review of Systems Review  of Systems  Constitutional: Negative for chills and fever.  HENT: Negative for congestion and rhinorrhea.   Eyes: Negative for redness and visual disturbance.  Respiratory: Negative for shortness of breath and wheezing.   Cardiovascular: Negative for chest pain and palpitations.  Gastrointestinal: Negative for abdominal pain, nausea and vomiting.  Genitourinary: Positive for pelvic pain. Negative for dysuria and urgency.  Musculoskeletal: Negative for arthralgias and myalgias.  Skin: Negative for pallor and wound.  Neurological: Negative for dizziness and headaches.     Physical Exam Updated Vital Signs BP (!) 176/100   Pulse 88   Temp 98.1 F (36.7 C) (Oral)   Resp 17   LMP 05/02/2018   SpO2 98%   Physical Exam  Constitutional: She is oriented to person, place, and time. She appears well-developed and well-nourished. No distress.  Obese  HENT:  Head:  Normocephalic and atraumatic.  Eyes: Pupils are equal, round, and reactive to light. EOM are normal.  Neck: Normal range of motion. Neck supple.  Cardiovascular: Normal rate and regular rhythm. Exam reveals no gallop and no friction rub.  No murmur heard. Pulmonary/Chest: Effort normal. She has no wheezes. She has no rales.  Abdominal: Soft. She exhibits no distension. There is no tenderness.  Genitourinary: Uterus is tender. Cervix exhibits motion tenderness and discharge (purulent). Right adnexum displays no mass and no tenderness. Left adnexum displays no mass and no tenderness.  Musculoskeletal: She exhibits no edema or tenderness.  Neurological: She is alert and oriented to person, place, and time.  Skin: Skin is warm and dry. She is not diaphoretic.  Psychiatric: She has a normal mood and affect. Her behavior is normal.  Nursing note and vitals reviewed.    ED Treatments / Results  Labs (all labs ordered are listed, but only abnormal results are displayed) Labs Reviewed  WET PREP, GENITAL - Abnormal; Notable for the following components:      Result Value   WBC, Wet Prep HPF POC MANY (*)    All other components within normal limits  RPR  HIV ANTIBODY (ROUTINE TESTING)  POC URINE PREG, ED  GC/CHLAMYDIA PROBE AMP ([place]    EKG None  Radiology No results found.  Procedures Procedures (including critical care time)  Medications Ordered in ED Medications  cefTRIAXone (ROCEPHIN) injection 250 mg (250 mg Intramuscular Given 05/18/18 0540)  azithromycin (ZITHROMAX) tablet 1,000 mg (1,000 mg Oral Given 05/18/18 0539)  sterile water (preservative free) injection (10 mLs  Given 05/18/18 0542)     Initial Impression / Assessment and Plan / ED Course  I have reviewed the triage vital signs and the nursing notes.  Pertinent labs & imaging results that were available during my care of the patient were reviewed by me and considered in my medical decision making  (see chart for details).     32 yo F with a cc of pelvic pain.  By history it sounds like the patient is having menstrual cycle pain.  Will obtain a pelvic exam.  CMT and purulent discharge on exam, will treat as PID.   6:37 AM:  I have discussed the diagnosis/risks/treatment options with the patient and family and believe the pt to be eligible for discharge home to follow-up with PCP. We also discussed returning to the ED immediately if new or worsening sx occur. We discussed the sx which are most concerning (e.g., sudden worsening pain, fever, inability to tolerate by mouth) that necessitate immediate return. Medications administered to the patient during  their visit and any new prescriptions provided to the patient are listed below.  Medications given during this visit Medications  cefTRIAXone (ROCEPHIN) injection 250 mg (250 mg Intramuscular Given 05/18/18 0540)  azithromycin (ZITHROMAX) tablet 1,000 mg (1,000 mg Oral Given 05/18/18 0539)  sterile water (preservative free) injection (10 mLs  Given 05/18/18 0542)      The patient appears reasonably screen and/or stabilized for discharge and I doubt any other medical condition or other [facility] requiring further screening, evaluation, or treatment in the ED at this time prior to discharge.    Final Clinical Impressions(s) / ED Diagnoses   Final diagnoses:  Cervicitis    ED Discharge Orders         Ordered    doxycycline (VIBRAMYCIN) 100 MG capsule  2 times daily,   Status:  Discontinued     05/18/18 0516    doxycycline (VIBRAMYCIN) 100 MG capsule  2 times daily     05/18/18 0516           Melene PlanFloyd, Zaelynn Fuchs, DO 05/18/18 [phone]

## 2018-05-19 LAB — RPR: RPR Ser Ql: REACTIVE — AB

## 2018-05-19 LAB — RPR, QUANT+TP ABS (REFLEX)
Rapid Plasma Reagin, Quant: 1:2 {titer} — ABNORMAL HIGH
TREPONEMA PALLIDUM AB: POSITIVE — AB

## 2018-06-22 ENCOUNTER — Other Ambulatory Visit (INDEPENDENT_AMBULATORY_CARE_PROVIDER_SITE_OTHER): Payer: Self-pay | Admitting: Physician Assistant

## 2018-06-22 DIAGNOSIS — I10 Essential (primary) hypertension: Secondary | ICD-10-CM

## 2018-06-22 MED FILL — HYDROCHLOROTHIAZIDE 25 MG T: 25 | 30 days supply | Qty: 30 | Fill #0

## 2018-06-22 NOTE — Telephone Encounter (Signed)
FWD to PCP. Patient is aware that she may need OV she is scheduled for 10/3. Maryjean Mornempestt S Roberts, CMA

## 2018-06-23 ENCOUNTER — Ambulatory Visit (INDEPENDENT_AMBULATORY_CARE_PROVIDER_SITE_OTHER): Payer: Self-pay | Admitting: Physician Assistant

## 2018-06-23 ENCOUNTER — Encounter (INDEPENDENT_AMBULATORY_CARE_PROVIDER_SITE_OTHER): Payer: Self-pay | Admitting: Physician Assistant

## 2018-06-23 ENCOUNTER — Other Ambulatory Visit: Payer: Self-pay

## 2018-06-23 VITALS — BP 146/90 | HR 83 | Temp 97.7°F | Ht 66.0 in | Wt 318.2 lb

## 2018-06-23 DIAGNOSIS — J069 Acute upper respiratory infection, unspecified: Secondary | ICD-10-CM

## 2018-06-23 DIAGNOSIS — I10 Essential (primary) hypertension: Secondary | ICD-10-CM

## 2018-06-23 DIAGNOSIS — E049 Nontoxic goiter, unspecified: Secondary | ICD-10-CM

## 2018-06-23 MED ORDER — HYDROCHLOROTHIAZIDE 25 MG PO TABS: 25.0000 mg | ORAL_TABLET | Freq: Every day | ORAL | 0 refills | Status: DC

## 2018-06-23 MED ORDER — CHLORPHEN-PE-ACETAMINOPHEN 2-5-325 MG PO TABS: 1.0000 | ORAL_TABLET | Freq: Four times a day (QID) | ORAL | 0 refills | Status: DC | PRN

## 2018-06-23 MED ORDER — AMLODIPINE BESYLATE 5 MG PO TABS: 5.0000 mg | ORAL_TABLET | Freq: Every day | ORAL | 3 refills | Status: DC

## 2018-06-23 MED FILL — AMLODIPINE BESYLATE 5 MG TA: 5 | 30 days supply | Qty: 30 | Fill #0

## 2018-06-23 NOTE — Patient Instructions (Addendum)

## 2018-06-23 NOTE — Progress Notes (Signed)
Subjective:  Patient ID: Laura Gutierrez, female    DOB: 11/13/1985  Age: 32 y.o. MRN: [id number]  CC: HTN  HPI Laura Gutierrez is a 32 y.o. female with a medical history of Obesity and Gonorrhea presents for  HTN f/u. Last BP 143/101 mmHg. Pt has run out of medications 3-4 days ago and will be picking up medications today. Does not endorse any cardiopulmonary complaints. Main complaint is of nasal congestion and postnasal drip. Believes she may be coming down with a cold. No close contacts with the same. Has not taken anything for relief. Does not endorse any other symptoms or complaints.      Outpatient Medications Prior to Visit  Medication Sig Dispense Refill  . Chlorphen-Phenyleph-APAP (CORICIDIN D COLD/FLU/SINUS) 2-5-325 MG TABS Take 1 tablet by mouth every 6 (six) hours as needed (cold).    . hydrochlorothiazide (HYDRODIURIL) 25 MG tablet Take 1 tablet (25 mg total) by mouth daily. Take on tablet in the morning. 30 tablet 0  . ibuprofen (ADVIL,MOTRIN) 200 MG tablet Take 400 mg by mouth every 6 (six) hours as needed for moderate pain.     No facility-administered medications prior to visit.      ROS Review of Systems  Constitutional: Negative for chills, fever and malaise/fatigue.  HENT: Positive for congestion. Negative for ear pain and sore throat.   Eyes: Negative for blurred vision and pain.  Respiratory: Negative for cough and shortness of breath.   Cardiovascular: Negative for chest pain and palpitations.  Gastrointestinal: Negative for abdominal pain and nausea.  Genitourinary: Negative for dysuria and hematuria.  Musculoskeletal: Negative for joint pain and myalgias.  Skin: Negative for rash.  Neurological: Negative for tingling and headaches.  Psychiatric/Behavioral: Negative for depression. The patient is not nervous/anxious.     Objective:  BP (!) 153/98 (BP Location: Left Arm, Patient Position: Sitting, Cuff Size: Large)   Pulse 86   Temp 97.7 F (36.5 C) (Oral)    Ht 5\' 6"  (1.676 m)   Wt (!) 318 lb 3.2 oz (144.3 kg)   LMP 06/06/2018 (Exact Date)   SpO2 96%   BMI 51.36 kg/m   BP/Weight 06/23/2018 05/18/2018 11/16/2017  Systolic BP 153 176 181  Diastolic BP 98 100 118  Wt. (Lbs) 318.2 - -  BMI 51.36 - 47.61      Physical Exam  Constitutional: She is oriented to person, place, and time.  Well developed, well nourished, NAD, polite  HENT:  Head: Normocephalic and atraumatic.  Mouth/Throat: No oropharyngeal exudate.  Mild TTP of the right maxillary sinus. Turbinates erythematous and mildly hypertrophic. Left TM mildly erythematous, no bulging/retraction, no fluid level. Postnasal drip.  Eyes: No scleral icterus.  Neck: Normal range of motion. Neck supple. Thyromegaly present.  Cardiovascular: Normal rate, regular rhythm and normal heart sounds.  Pulmonary/Chest: Effort normal and breath sounds normal.  Musculoskeletal: She exhibits no edema.  Lymphadenopathy:    She has no cervical adenopathy.  Neurological: She is alert and oriented to person, place, and time.  Skin: Skin is warm and dry. No rash noted. No erythema. No pallor.  Psychiatric: She has a normal mood and affect. Her behavior is normal. Thought content normal.  Vitals reviewed.    Assessment & Plan:   1. Hypertension, unspecified type - hydrochlorothiazide (HYDRODIURIL) 25 MG tablet; Take 1 tablet (25 mg total) by mouth daily. Take on tablet in the morning.  Dispense: 30 tablet; Refill: 0 - amLODipine (NORVASC) 5 MG tablet; Take 1 tablet (5 mg  total) by mouth daily.  Dispense: 90 tablet; Refill: 3  2. Acute upper respiratory infection - Chlorphen-Phenyleph-APAP (CORICIDIN D COLD/FLU/SINUS) 2-5-325 MG TABS; Take 1 tablet by mouth every 6 (six) hours as needed (cold).  Dispense: 40 each; Refill: 0  3. Thyroid enlargement - TSH  - TSH normal 14 months ago.   Meds ordered this encounter  Medications  . hydrochlorothiazide (HYDRODIURIL) 25 MG tablet    Sig: Take 1 tablet  (25 mg total) by mouth daily. Take on tablet in the morning.    Dispense:  30 tablet    Refill:  0    Needs appointment before future refills are approved.    Order Specific Question:   Supervising Provider    Answer:   Hoy Register [[id number]]  . amLODipine (NORVASC) 5 MG tablet    Sig: Take 1 tablet (5 mg total) by mouth daily.    Dispense:  90 tablet    Refill:  3    Order Specific Question:   Supervising Provider    Answer:   Hoy Register [[id number]]  . Chlorphen-Phenyleph-APAP (CORICIDIN D COLD/FLU/SINUS) 2-5-325 MG TABS    Sig: Take 1 tablet by mouth every 6 (six) hours as needed (cold).    Dispense:  40 each    Refill:  0    Order Specific Question:   Supervising Provider    Answer:   Hoy Register [[id number]]    Follow-up: Return in about 4 weeks (around 07/21/2018) for HTN.   Loletta Specter PA

## 2018-06-24 LAB — TSH: TSH: 0.87 u[IU]/mL (ref 0.450–4.500)

## 2018-06-26 ENCOUNTER — Telehealth (INDEPENDENT_AMBULATORY_CARE_PROVIDER_SITE_OTHER): Payer: Self-pay

## 2018-06-26 NOTE — Telephone Encounter (Signed)
Left voicemail asking patient to return call to [facility]. Tempestt S Roberts, CMA  

## 2018-06-26 NOTE — Telephone Encounter (Signed)
-----   Message from Loletta Specteroger David Gomez, PA-C sent at 06/26/2018 12:36 PM EDT ----- Normal thyroid level.

## 2018-06-28 ENCOUNTER — Telehealth (INDEPENDENT_AMBULATORY_CARE_PROVIDER_SITE_OTHER): Payer: Self-pay

## 2018-06-28 NOTE — Telephone Encounter (Signed)
-----   Message from Roger David Gomez, PA-C sent at 06/26/2018 12:36 PM EDT ----- Normal thyroid level. 

## 2018-06-28 NOTE — Telephone Encounter (Signed)
Patient returned call to [facility] and was informed that thyroid level is normal. Maryjean Morn, CMA

## 2018-07-06 ENCOUNTER — Ambulatory Visit (INDEPENDENT_AMBULATORY_CARE_PROVIDER_SITE_OTHER): Payer: Self-pay | Admitting: Physician Assistant

## 2018-07-13 MED FILL — AMLODIPINE BESYLATE 5 MG TA: 5 | 30 days supply | Qty: 30 | Fill #0

## 2018-07-24 ENCOUNTER — Ambulatory Visit (INDEPENDENT_AMBULATORY_CARE_PROVIDER_SITE_OTHER): Payer: Self-pay | Admitting: Physician Assistant

## 2018-11-20 ENCOUNTER — Encounter (HOSPITAL_COMMUNITY): Payer: Self-pay

## 2018-11-20 ENCOUNTER — Other Ambulatory Visit: Payer: Self-pay

## 2018-11-20 ENCOUNTER — Emergency Department (HOSPITAL_COMMUNITY)
Admission: EM | Admit: 2018-11-20 | Discharge: 2018-11-20 | Disposition: A | Discharge status: Home / Self Care | Payer: Self-pay | Attending: Emergency Medicine | Admitting: Emergency Medicine

## 2018-11-20 DIAGNOSIS — R21 Rash and other nonspecific skin eruption: Secondary | ICD-10-CM | POA: Insufficient documentation

## 2018-11-20 DIAGNOSIS — Z79899 Other long term (current) drug therapy: Secondary | ICD-10-CM | POA: Insufficient documentation

## 2018-11-20 DIAGNOSIS — F17210 Nicotine dependence, cigarettes, uncomplicated: Secondary | ICD-10-CM | POA: Insufficient documentation

## 2018-11-20 DIAGNOSIS — I10 Essential (primary) hypertension: Secondary | ICD-10-CM | POA: Insufficient documentation

## 2018-11-20 MED ORDER — DIPHENHYDRAMINE HCL 25 MG PO CAPS
25.0000 mg | ORAL_CAPSULE | Freq: Once | ORAL | Status: AC
Start: 2018-11-20 — End: 2018-11-20
  Administered 2018-11-20: 25 mg via ORAL
  Filled 2018-11-20: qty 1

## 2018-11-20 MED ORDER — FAMOTIDINE 20 MG PO TABS
20.0000 mg | ORAL_TABLET | Freq: Once | ORAL | Status: AC
  Administered 2018-11-20: 20 mg via ORAL
  Filled 2018-11-20: qty 1

## 2018-11-20 NOTE — Discharge Instructions (Signed)
Your rash is likely an allergic reaction to your new detergent, take Benadryl and Pepcid to help with itching and rash, if Benadryl causes drowsiness you may use Zyrtec during the day.  Also make sure that you rewash all of your closing your old detergent to remove the trigger for this rash.  If rash is not improving please follow-up with your primary care doctor.  Return to the emergency department if you develop facial swelling, swelling of the lips or tongue, difficulty swallowing or breathing or any other new or concerning symptoms.

## 2018-11-20 NOTE — ED Notes (Signed)
Benadryl was sent home with pt.  She drove to the ED and is not able to call for transport home.  She was instructed to take the benadryl when she gets home.  Adelina Mings EDPA is aware.

## 2018-11-20 NOTE — ED Triage Notes (Signed)
Pt states she has had reddened spots on arm, back, and chest that itch. Pt reports using a new detergent.

## 2018-11-20 NOTE — ED Provider Notes (Signed)
[facility]-EMERGENCY DEPT Provider Note   CSN: [id number] Arrival date & time: 11/20/18  1708    History   Chief Complaint Chief Complaint  Patient presents with  . Urticaria    HPI [name] is a 33 y.o. female.     [name] is a 33 y.o. female with a history of hypertension and obesity, who presents to the emergency department for evaluation of rash.  Patient reports rash is been present for the past 2 days she notes red raised spots on her back, chest and arms.  Rash is pruritic but not painful.  She has not noticed any areas of drainage.  Has not had any fevers or chills, nausea or vomiting.  No facial swelling, difficulty breathing or swallowing, lightheadedness, nausea or vomiting.  Patient reports that she did recently switch to a new detergent, no other changes in household products, foods or medications.  No one else in the house with similar symptoms.  No prior history of allergic reactions or anaphylaxis.     Past Medical History:  Diagnosis Date  . Gonorrhea   . Hypertension   . Obesity     Patient Active Problem List   Diagnosis Date Noted  . Hypertension 12/23/2016  . Obesity with serious comorbidity 12/23/2016    Past Surgical History:  Procedure Laterality Date  . TONSILLECTOMY       OB History    Gravida  0   Para      Term      Preterm      AB      Living        SAB      TAB      Ectopic      Multiple      Live Births               Home Medications    Prior to Admission medications   Medication Sig Start Date End Date Taking? Authorizing Provider  amLODipine (NORVASC) 5 MG tablet Take 1 tablet (5 mg total) by mouth daily. 06/23/18   Loletta SpecterGomez, Roger David, PA-C  Chlorphen-Phenyleph-APAP (CORICIDIN D COLD/FLU/SINUS) 2-5-325 MG TABS Take 1 tablet by mouth every 6 (six) hours as needed (cold). 06/23/18   Loletta SpecterGomez, Roger David, PA-C  hydrochlorothiazide (HYDRODIURIL) 25 MG tablet Take 1 tablet (25 mg  total) by mouth daily. Take on tablet in the morning. 06/23/18 07/23/18  Loletta SpecterGomez, Roger David, PA-C    Family History Family History  Problem Relation Age of Onset  . Hypertension Mother   . Alzheimer's disease Maternal Aunt   . Heart disease Maternal Grandfather   . Diabetes Paternal Grandmother     Social History Social History   Tobacco Use  . Smoking status: Current Every Day Smoker    Packs/day: 0.25    Types: Cigarettes  . Smokeless tobacco: Never Used  Substance Use Topics  . Alcohol use: Yes    Alcohol/week: 0.0 standard drinks    Comment: sometimes    . Drug use: No     Allergies   Bactrim [sulfamethoxazole-trimethoprim]   Review of Systems Review of Systems  Constitutional: Negative for chills and fever.  HENT: Negative for facial swelling.   Respiratory: Negative for shortness of breath and stridor.   Gastrointestinal: Negative for nausea and vomiting.  Skin: Positive for rash.  Neurological: Negative for light-headedness.     Physical Exam Updated Vital Signs BP (!) 155/116 (BP Location: Right Arm)   Pulse 84  Temp 98.2 F (36.8 C) (Oral)   Resp 16   Ht 5\' 6"  (1.676 m)   Wt (!) 139.3 kg   LMP 11/02/2018   SpO2 99%   BMI 49.55 kg/m   Physical Exam Vitals signs and nursing note reviewed.  Constitutional:      General: She is not in acute distress.    Appearance: She is well-developed. She is not diaphoretic.  HENT:     Head: Normocephalic and atraumatic.     Mouth/Throat:     Mouth: Mucous membranes are moist.     Pharynx: Oropharynx is clear.     Comments: No facial swelling, posterior oropharynx is clear and mucous membranes are moist, tolerating secretions without difficulty, no trismus and normal phonation. Eyes:     General:        Right eye: No discharge.        Left eye: No discharge.  Neck:     Musculoskeletal: Neck supple.     Comments: No stridor Cardiovascular:     Rate and Rhythm: Normal rate and regular rhythm.      Pulses: Normal pulses.     Heart sounds: Normal heart sounds.  Pulmonary:     Effort: Pulmonary effort is normal. No respiratory distress.     Breath sounds: Normal breath sounds.     Comments: Respirations equal and unlabored, patient able to speak in full sentences, lungs clear to auscultation bilaterally Skin:    General: Skin is warm and dry.     Findings: Rash present.     Comments: Raised erythematous rash over the back, abdomen and upper extremities, no vesicles or pustules, no petechiae, rash seems most consistent with mild urticaria.  Neurological:     Mental Status: She is alert.     Coordination: Coordination normal.  Psychiatric:        Mood and Affect: Mood normal.        Behavior: Behavior normal.      ED Treatments / Results  Labs (all labs ordered are listed, but only abnormal results are displayed) Labs Reviewed - No data to display  EKG None  Radiology No results found.  Procedures Procedures (including critical care time)  Medications Ordered in ED Medications  diphenhydrAMINE (BENADRYL) capsule 25 mg (has no administration in time range)  famotidine (PEPCID) tablet 20 mg (has no administration in time range)     Initial Impression / Assessment and Plan / ED Course  I have reviewed the triage vital signs and the nursing notes.  Pertinent labs & imaging results that were available during my care of the patient were reviewed by me and considered in my medical decision making (see chart for details).  Rash consistent with mild urticaria. Patient denies any difficulty breathing or swallowing.  Pt has a patent airway without stridor and is handling secretions without difficulty; no angioedema. No blisters, no pustules, no warmth, no draining sinus tracts, no superficial abscesses, no bullous impetigo, no vesicles, no desquamation, no target lesions with dusky purpura or a central bulla. Not tender to touch. No concern for superimposed infection. No concern  for SJS, TEN, TSS, tick borne illness, syphilis or other life-threatening condition. Will discharge home with Pepcid and Benadryl.  PCP follow-up.  Return precautions discussed.   Final Clinical Impressions(s) / ED Diagnoses   Final diagnoses:  Rash    ED Discharge Orders    None       Legrand Rams 11/20/18 [address]  Caryn Bee, MD 11/21/18 [phone]

## 2018-11-26 DIAGNOSIS — I10 Essential (primary) hypertension: Secondary | ICD-10-CM | POA: Insufficient documentation

## 2018-11-26 DIAGNOSIS — Z79899 Other long term (current) drug therapy: Secondary | ICD-10-CM | POA: Insufficient documentation

## 2018-11-26 DIAGNOSIS — F17210 Nicotine dependence, cigarettes, uncomplicated: Secondary | ICD-10-CM | POA: Insufficient documentation

## 2018-11-26 DIAGNOSIS — R21 Rash and other nonspecific skin eruption: Secondary | ICD-10-CM | POA: Insufficient documentation

## 2018-11-27 ENCOUNTER — Encounter (HOSPITAL_COMMUNITY): Payer: Self-pay | Admitting: Emergency Medicine

## 2018-11-27 ENCOUNTER — Emergency Department (HOSPITAL_COMMUNITY)
Admission: EM | Admit: 2018-11-27 | Discharge: 2018-11-27 | Disposition: A | Discharge status: Home / Self Care | Payer: Self-pay | Attending: Emergency Medicine | Admitting: Emergency Medicine

## 2018-11-27 ENCOUNTER — Other Ambulatory Visit: Payer: Self-pay

## 2018-11-27 DIAGNOSIS — R21 Rash and other nonspecific skin eruption: Secondary | ICD-10-CM

## 2018-11-27 MED ORDER — TRIAMCINOLONE ACETONIDE 0.025 % EX LOTN: TOPICAL_LOTION | CUTANEOUS | 1 refills | Status: DC

## 2018-11-27 NOTE — Discharge Instructions (Addendum)
Apply the triamcinolone lotion to itchy areas 2-3 times daily for 2 to 3 weeks. If use of the triamcinolone lotion worsens the rash, discontinue its use. Follow-up with a primary care provider on this matter.  May also need to follow-up with a dermatologist.  Typically this type of rash will resolve on its own within 2 to 3 months.

## 2018-11-27 NOTE — ED Provider Notes (Signed)
[facility] EMERGENCY DEPARTMENT Provider Note   CSN: [id number] Arrival date & time: 11/26/18  2352    History   Chief Complaint Chief Complaint  Patient presents with  . Rash    HPI Laura Gutierrez is a 33 y.o. female.     HPI   Laura Gutierrez is a 33 y.o. female, with a history of HTN and obesity, presenting to the ED with pruritic rash beginning almost 2 weeks ago.  States it started with a large spot on her lower back and has spread from there.  She has lesions on her arms, chest, and back.  Denies fever/chills, shortness of breath, facial swelling, intraoral oral lesions, hand/foot lesions, joint pain, other symptoms of illness, genital lesions, or any other complaints      Past Medical History:  Diagnosis Date  . Gonorrhea   . Hypertension   . Obesity     Patient Active Problem List   Diagnosis Date Noted  . Hypertension 12/23/2016  . Obesity with serious comorbidity 12/23/2016    Past Surgical History:  Procedure Laterality Date  . TONSILLECTOMY       OB History    Gravida  0   Para      Term      Preterm      AB      Living        SAB      TAB      Ectopic      Multiple      Live Births               Home Medications    Prior to Admission medications   Medication Sig Start Date End Date Taking? Authorizing Provider  amLODipine (NORVASC) 5 MG tablet Take 1 tablet (5 mg total) by mouth daily. 06/23/18   Loletta Specter, PA-C  Chlorphen-Phenyleph-APAP (CORICIDIN D COLD/FLU/SINUS) 2-5-325 MG TABS Take 1 tablet by mouth every 6 (six) hours as needed (cold). 06/23/18   Loletta Specter, PA-C  hydrochlorothiazide (HYDRODIURIL) 25 MG tablet Take 1 tablet (25 mg total) by mouth daily. Take on tablet in the morning. 06/23/18 07/23/18  Loletta Specter, PA-C  Triamcinolone Acetonide 0.025 % LOTN Apply the lotion to itchy areas 2-3 times daily for up to 3 weeks. 11/27/18   Anselm Pancoast, PA-C    Family History Family  History  Problem Relation Age of Onset  . Hypertension Mother   . Alzheimer's disease Maternal Aunt   . Heart disease Maternal Grandfather   . Diabetes Paternal Grandmother     Social History Social History   Tobacco Use  . Smoking status: Current Every Day Smoker    Packs/day: 0.25    Types: Cigarettes  . Smokeless tobacco: Never Used  Substance Use Topics  . Alcohol use: Yes    Alcohol/week: 0.0 standard drinks    Comment: sometimes    . Drug use: No     Allergies   Bactrim [sulfamethoxazole-trimethoprim]   Review of Systems Review of Systems  Constitutional: Negative for fever.  Respiratory: Negative for shortness of breath.   Cardiovascular: Negative for chest pain.  Gastrointestinal: Negative for abdominal pain, nausea and vomiting.  Musculoskeletal: Negative for arthralgias.  Skin: Positive for rash.  Neurological: Negative for headaches.     Physical Exam Updated Vital Signs BP 123/68 (BP Location: Right Arm)   Pulse 95   Temp 97.9 F (36.6 C) (Oral)   Resp 18   LMP 11/02/2018  SpO2 96%   Physical Exam Vitals signs and nursing note reviewed.  Constitutional:      General: She is not in acute distress.    Appearance: She is well-developed. She is not diaphoretic.  HENT:     Head: Normocephalic and atraumatic.     Nose: Nose normal.     Mouth/Throat:     Mouth: Mucous membranes are moist.     Pharynx: Oropharynx is clear.     Comments: No noted intraoral lesions or swelling.  No facial swelling or lesions. Eyes:     Conjunctiva/sclera: Conjunctivae normal.  Neck:     Musculoskeletal: Neck supple.  Cardiovascular:     Rate and Rhythm: Normal rate and regular rhythm.     Pulses: Normal pulses.  Pulmonary:     Effort: Pulmonary effort is normal. No respiratory distress.     Breath sounds: Normal breath sounds.  Abdominal:     Palpations: Abdomen is soft.     Tenderness: There is no abdominal tenderness. There is no guarding.    Musculoskeletal:     Right lower leg: No edema.     Left lower leg: No edema.  Lymphadenopathy:     Cervical: No cervical adenopathy.  Skin:    General: Skin is warm and dry.     Coloration: Skin is not pale.     Findings: Rash present.     Comments: Patches of scaly, slightly raised, erythematous lesions to the patient's arms, chest, and back.  Spares the palms and the soles.  No vesicles or pustules.  There is a much larger patch on the right lower back that appears to be consistent with a herald patch.  Neurological:     Mental Status: She is alert.  Psychiatric:        Mood and Affect: Mood and affect normal.        Speech: Speech normal.        Behavior: Behavior normal.                  ED Treatments / Results  Labs (all labs ordered are listed, but only abnormal results are displayed) Labs Reviewed - No data to display  EKG None  Radiology No results found.  Procedures Procedures (including critical care time)  Medications Ordered in ED Medications - No data to display   Initial Impression / Assessment and Plan / ED Course  I have reviewed the triage vital signs and the nursing notes.  Pertinent labs & imaging results that were available during my care of the patient were reviewed by me and considered in my medical decision making (see chart for details).        Patient presents with a pruritic rash.  No red flag rash symptoms.  No accompanying infectious symptoms.  Rash seems to have the appearance and behavior of pityriasis rosea.  Patient has follow-up with her PCP scheduled for March 4.  We will try a course of triamcinolone lotion, per up-to-date recommendation.  Final Clinical Impressions(s) / ED Diagnoses   Final diagnoses:  Pityriasis rosea-like skin eruption    ED Discharge Orders         Ordered    Triamcinolone Acetonide 0.025 % LOTN     11/27/18 0152           Anselm Pancoast, PA-C 11/27/18 0159    Ward, Layla Maw,  DO 11/27/18 0234

## 2018-11-27 NOTE — ED Triage Notes (Signed)
C/o rash to back and bilateral arms x 2 weeks.  States she was seen for same at [facility] and thought to be related to new detergent.  States she switched back to old detergent and has not gotten any better.

## 2018-12-06 ENCOUNTER — Ambulatory Visit (INDEPENDENT_AMBULATORY_CARE_PROVIDER_SITE_OTHER): Payer: Self-pay | Admitting: Primary Care

## 2018-12-21 ENCOUNTER — Ambulatory Visit (INDEPENDENT_AMBULATORY_CARE_PROVIDER_SITE_OTHER): Payer: Self-pay | Admitting: Primary Care

## 2018-12-21 ENCOUNTER — Other Ambulatory Visit: Payer: Self-pay

## 2018-12-21 ENCOUNTER — Encounter (INDEPENDENT_AMBULATORY_CARE_PROVIDER_SITE_OTHER): Payer: Self-pay | Admitting: Primary Care

## 2018-12-21 VITALS — BP 144/95 | HR 108 | Temp 97.9°F | Ht 66.0 in | Wt 330.2 lb

## 2018-12-21 DIAGNOSIS — L259 Unspecified contact dermatitis, unspecified cause: Secondary | ICD-10-CM

## 2018-12-21 DIAGNOSIS — Z6841 Body Mass Index (BMI) 40.0 and over, adult: Secondary | ICD-10-CM

## 2018-12-21 DIAGNOSIS — I10 Essential (primary) hypertension: Secondary | ICD-10-CM

## 2018-12-21 MED ORDER — HYDROCHLOROTHIAZIDE 25 MG PO TABS: 25.0000 mg | ORAL_TABLET | Freq: Every day | ORAL | 3 refills | Status: DC

## 2018-12-21 MED ORDER — LORATADINE 10 MG PO TABS: 10.0000 mg | ORAL_TABLET | Freq: Every day | ORAL | 11 refills | Status: DC

## 2018-12-21 MED ORDER — OMEPRAZOLE 20 MG PO CPDR: 20.0000 mg | DELAYED_RELEASE_CAPSULE | Freq: Every day | ORAL | 3 refills | Status: DC

## 2018-12-21 MED ORDER — HYDROXYZINE PAMOATE 25 MG PO CAPS: 25.0000 mg | ORAL_CAPSULE | Freq: Three times a day (TID) | ORAL | 0 refills | Status: DC | PRN

## 2018-12-21 MED ORDER — AMLODIPINE BESYLATE 10 MG PO TABS: 5.0000 mg | ORAL_TABLET | Freq: Every day | ORAL | 3 refills | Status: DC

## 2018-12-21 MED ORDER — FAMOTIDINE 20 MG PO TABS: 20.0000 mg | ORAL_TABLET | Freq: Two times a day (BID) | ORAL | 2 refills | Status: DC

## 2018-12-21 NOTE — Progress Notes (Signed)
Acute Office Visit  Subjective:    Patient ID: [name], female    DOB: 12/11/1985, 33 y.o.   MRN: [id number]  Chief Complaint  Patient presents with  . Hypertension  . Rash    HPI Patient is in today for a rash that has spread to her entire body and is pruritis and f/u on her HTN. She has a PMH of obesity, HTN and treatable STD.  Past Medical History:  Diagnosis Date  . Gonorrhea   . Hypertension   . Obesity     Past Surgical History:  Procedure Laterality Date  . TONSILLECTOMY      Family History  Problem Relation Age of Onset  . Hypertension Mother   . Alzheimer's disease Maternal Aunt   . Heart disease Maternal Grandfather   . Diabetes Paternal Grandmother     Social History   Socioeconomic History  . Marital status: Single    Spouse name: Not on file  . Number of children: Not on file  . Years of education: Not on file  . Highest education level: Not on file  Occupational History  . Not on file  Social Needs  . Financial resource strain: Not on file  . Food insecurity:    Worry: Not on file    Inability: Not on file  . Transportation needs:    Medical: Not on file    Non-medical: Not on file  Tobacco Use  . Smoking status: Current Every Day Smoker    Packs/day: 0.25    Types: Cigarettes  . Smokeless tobacco: Never Used  Substance and Sexual Activity  . Alcohol use: Yes    Alcohol/week: 0.0 standard drinks    Comment: sometimes    . Drug use: No  . Sexual activity: Yes    Birth control/protection: None  Lifestyle  . Physical activity:    Days per week: Not on file    Minutes per session: Not on file  . Stress: Not on file  Relationships  . Social connections:    Talks on phone: Not on file    Gets together: Not on file    Attends religious service: Not on file    Active member of club or organization: Not on file    Attends meetings of clubs or organizations: Not on file    Relationship status: Not on file  . Intimate partner  violence:    Fear of current or ex partner: Not on file    Emotionally abused: Not on file    Physically abused: Not on file    Forced sexual activity: Not on file  Other Topics Concern  . Not on file  Social History Narrative  . Not on file    Outpatient Medications Prior to Visit  Medication Sig Dispense Refill  . amLODipine (NORVASC) 5 MG tablet Take 1 tablet (5 mg total) by mouth daily. 90 tablet 3  . Chlorphen-Phenyleph-APAP (CORICIDIN D COLD/FLU/SINUS) 2-5-325 MG TABS Take 1 tablet by mouth every 6 (six) hours as needed (cold). 40 each 0  . hydrochlorothiazide (HYDRODIURIL) 25 MG tablet Take 1 tablet (25 mg total) by mouth daily. Take on tablet in the morning. 30 tablet 0  . Triamcinolone Acetonide 0.025 % LOTN Apply the lotion to itchy areas 2-3 times daily for up to 3 weeks. 60 mL 1   No facility-administered medications prior to visit.     Allergies  Allergen Reactions  . Bactrim [Sulfamethoxazole-Trimethoprim] Anaphylaxis    Review of Systems  Constitutional: Negative.        Objective:    Physical Exam  Constitutional: She is oriented to person, place, and time. She appears well-developed and well-nourished.  HENT:  Head: Normocephalic and atraumatic.  Eyes: Pupils are equal, round, and reactive to light. EOM are normal.  Neck: Normal range of motion. Neck supple.  Cardiovascular: Normal rate and regular rhythm.  Pulmonary/Chest: Effort normal and breath sounds normal.  Abdominal: Soft. Bowel sounds are normal.  Musculoskeletal: Normal range of motion.  Neurological: She is alert and oriented to person, place, and time.  Skin: Skin is warm.  Psychiatric: She has a normal mood and affect.    BP (!) 144/95 (BP Location: Right Arm, Patient Position: Sitting, Cuff Size: Large)   Pulse (!) 108   Temp 97.9 F (36.6 C) (Oral)   Ht 5\' 6"  (1.676 m)   Wt (!) 330 lb 3.2 oz (149.8 kg)   LMP 12/01/2018 (Exact Date)   SpO2 99%   BMI 53.30 kg/m  Wt Readings from  Last 3 Encounters:  12/21/18 (!) 330 lb 3.2 oz (149.8 kg)  11/20/18 (!) 307 lb (139.3 kg)  06/23/18 (!) 318 lb 3.2 oz (144.3 kg)    Health Maintenance Due  Topic Date Due  . PAP SMEAR-Modifier  12/15/2017    There are no preventive care reminders to display for this patient.   Lab Results  Component Value Date   TSH 0.870 06/23/2018   Lab Results  Component Value Date   WBC 8.7 12/16/2014   HGB 8.4 (L) 12/16/2014   HCT 30.2 (L) 12/16/2014   MCV 72.8 (L) 12/16/2014   PLT 367 12/16/2014   Lab Results  Component Value Date   NA 139 05/27/2017   K 3.9 05/27/2017   CO2 21 05/27/2017   GLUCOSE 98 05/27/2017   BUN 14 05/27/2017   CREATININE 0.92 05/27/2017   BILITOT 0.3 05/27/2017   ALKPHOS 98 05/27/2017   AST 15 05/27/2017   ALT 13 05/27/2017   PROT 7.1 05/27/2017   ALBUMIN 4.0 05/27/2017   CALCIUM 8.5 (L) 05/27/2017   Lab Results  Component Value Date   CHOL 181 10/22/2011   Lab Results  Component Value Date   HDL 41 10/22/2011   Lab Results  Component Value Date   LDLCALC 121 (H) 10/22/2011   Lab Results  Component Value Date   TRIG 96 10/22/2011   Lab Results  Component Value Date   CHOLHDL 4.4 10/22/2011   No results found for: HGBA1C     Assessment & Plan:   Problem List Items Addressed This Visit    Hypertension - Primary   Relevant Medications   hydrochlorothiazide (HYDRODIURIL) 25 MG tablet   amLODipine (NORVASC) 10 MG tablet   Obesity with serious comorbidity    Other Visit Diagnoses    Contact dermatitis and eczema         Rylah was seen today for hypertension and rash.  Diagnoses and all orders for this visit:  Hypertension, unspecified type Elevated at this visit d/w reduction in sodium intake and cont the following bp medication -     hydrochlorothiazide (HYDRODIURIL) 25 MG tablet; Take 1 tablet (25 mg total) by mouth daily for 30 days. Take on tablet in the morning. -     amLODipine (NORVASC) 10 MG tablet; Take 0.5 tablets  (5 mg total) by mouth daily for 30 days.  Class 3 severe obesity due to excess calories with serious comorbidity in adult, unspecified BMI (  HCC) Encourage exercising 30 mins daily   Contact dermatitis and eczema With the extent of her rashes targeted at all receptor sites omeprazole PPI, H2 blocker , pepcid and a antihistimine Other orders -     omeprazole (PRILOSEC) 20 MG capsule; Take 1 capsule (20 mg total) by mouth daily. -     famotidine (PEPCID) 20 MG tablet; Take 1 tablet (20 mg total) by mouth 2 (two) times daily. -     hydrOXYzine (VISTARIL) 25 MG capsule; Take 1 capsule (25 mg total) by mouth 3 (three) times daily as needed. -     loratadine (CLARITIN) 10 MG tablet; Take 1 tablet (10 mg total) by mouth daily.    Meds ordered this encounter  Medications  . omeprazole (PRILOSEC) 20 MG capsule    Sig: Take 1 capsule (20 mg total) by mouth daily.    Dispense:  30 capsule    Refill:  3  . famotidine (PEPCID) 20 MG tablet    Sig: Take 1 tablet (20 mg total) by mouth 2 (two) times daily.    Dispense:  60 tablet    Refill:  2  . hydrOXYzine (VISTARIL) 25 MG capsule    Sig: Take 1 capsule (25 mg total) by mouth 3 (three) times daily as needed.    Dispense:  30 capsule    Refill:  0  . loratadine (CLARITIN) 10 MG tablet    Sig: Take 1 tablet (10 mg total) by mouth daily.    Dispense:  30 tablet    Refill:  11  . hydrochlorothiazide (HYDRODIURIL) 25 MG tablet    Sig: Take 1 tablet (25 mg total) by mouth daily for 30 days. Take on tablet in the morning.    Dispense:  30 tablet    Refill:  3    Needs appointment before future refills are approved.  Marland Kitchen amLODipine (NORVASC) 10 MG tablet    Sig: Take 0.5 tablets (5 mg total) by mouth daily for 30 days.    Dispense:  30 tablet    Refill:  3     Grayce Sessions, NP

## 2018-12-21 NOTE — Patient Instructions (Signed)
Contact Dermatitis  Dermatitis is redness, soreness, and swelling (inflammation) of the skin. Contact dermatitis is a reaction to something that touches the skin.  There are two types of contact dermatitis:   Irritant contact dermatitis. This happens when something bothers (irritates) your skin, like soap.   Allergic contact dermatitis. This is caused when you are exposed to something that you are allergic to, such as poison ivy.  What are the causes?   Common causes of irritant contact dermatitis include:  ? Makeup.  ? Soaps.  ? Detergents.  ? Bleaches.  ? Acids.  ? Metals, such as nickel.   Common causes of allergic contact dermatitis include:  ? Plants.  ? Chemicals.  ? Jewelry.  ? Latex.  ? Medicines.  ? Preservatives in products, such as clothing.  What increases the risk?   Having a job that exposes you to things that bother your skin.   Having asthma or eczema.  What are the signs or symptoms?  Symptoms may happen anywhere the irritant has touched your skin. Symptoms include:   Dry or flaky skin.   Redness.   Cracks.   Itching.   Pain or a burning feeling.   Blisters.   Blood or clear fluid draining from skin cracks.  With allergic contact dermatitis, swelling may occur. This may happen in places such as the eyelids, mouth, or genitals.  How is this treated?   This condition is treated by checking for the cause of the reaction and protecting your skin. Treatment may also include:  ? Steroid creams, ointments, or medicines.  ? Antibiotic medicines or other ointments, if you have a skin infection.  ? Lotion or medicines to help with itching.  ? A bandage (dressing).  Follow these instructions at home:  Skin care   Moisturize your skin as needed.   Put cool cloths on your skin.   Put a baking soda paste on your skin. Stir water into baking soda until it looks like a paste.   Do not scratch your skin.   Avoid having things rub up against your skin.   Avoid the use of soaps, perfumes, and  dyes.  Medicines   Take or apply over-the-counter and prescription medicines only as told by your doctor.   If you were prescribed an antibiotic medicine, take or apply it as told by your doctor. Do not stop using it even if your condition starts to get better.  Bathing   Take a bath with:  ? Epsom salts.  ? Baking soda.  ? Colloidal oatmeal.   Bathe less often.   Bathe in warm water. Avoid using hot water.  Bandage care   If you were given a bandage, change it as told by your health care provider.   Wash your hands with soap and water before and after you change your bandage. If soap and water are not available, use hand sanitizer.  General instructions   Avoid the things that caused your reaction. If you do not know what caused it, keep a journal. Write down:  ? What you eat.  ? What skin products you use.  ? What you drink.  ? What you wear in the area that has symptoms. This includes jewelry.   Check the affected areas every day for signs of infection. Check for:  ? More redness, swelling, or pain.  ? More fluid or blood.  ? Warmth.  ? Pus or a bad smell.   Keep all follow-up visits as   told by your doctor. This is important.  Contact a doctor if:   You do not get better with treatment.   Your condition gets worse.   You have signs of infection, such as:  ? More swelling.  ? Tenderness.  ? More redness.  ? Soreness.  ? Warmth.   You have a fever.   You have new symptoms.  Get help right away if:   You have a very bad headache.   You have neck pain.   Your neck is stiff.   You throw up (vomit).   You feel very sleepy.   You see red streaks coming from the area.   Your bone or joint near the area hurts after the skin has healed.   The area turns darker.   You have trouble breathing.  Summary   Dermatitis is redness, soreness, and swelling of the skin.   Symptoms may occur where the irritant has touched you.   Treatment may include medicines and skin care.   If you do not know what caused  your reaction, keep a journal.   Contact a doctor if your condition gets worse or you have signs of infection.  This information is not intended to replace advice given to you by your health care provider. Make sure you discuss any questions you have with your health care provider.  Document Released: 07/18/2009 Document Revised: 04/05/2018 Document Reviewed: 04/05/2018  Elsevier Interactive Patient Education  2019 [employer]

## 2018-12-22 MED FILL — FAMOTIDINE 20 MG TABLET: 20 | 30 days supply | Qty: 60 | Fill #0

## 2018-12-22 MED FILL — AMLODIPINE BESYLATE 10 MG T: 10 | 30 days supply | Qty: 15 | Fill #0

## 2018-12-22 MED FILL — HYDROCHLOROTHIAZIDE 25 MG T: 25 | 30 days supply | Qty: 30 | Fill #0

## 2018-12-22 MED FILL — hydrOXYzine PAMOATE 25 MG C: 25 | 10 days supply | Qty: 30 | Fill #0

## 2018-12-22 MED FILL — OMEPRAZOLE 20 MG CAPSULE DR: 20 | 30 days supply | Qty: 30 | Fill #0

## 2019-01-04 ENCOUNTER — Ambulatory Visit (INDEPENDENT_AMBULATORY_CARE_PROVIDER_SITE_OTHER): Payer: Self-pay

## 2019-04-27 ENCOUNTER — Other Ambulatory Visit: Payer: Self-pay

## 2019-04-27 ENCOUNTER — Emergency Department (HOSPITAL_COMMUNITY)
Admission: EM | Admit: 2019-04-27 | Discharge: 2019-04-28 | Discharge status: Against Medical Advice | Payer: Self-pay | Attending: Emergency Medicine | Admitting: Emergency Medicine

## 2019-04-27 DIAGNOSIS — Z5321 Procedure and treatment not carried out due to patient leaving prior to being seen by health care provider: Secondary | ICD-10-CM | POA: Insufficient documentation

## 2019-04-27 LAB — I-STAT BETA HCG BLOOD, ED (MC, WL, AP ONLY): I-stat hCG, quantitative: <5 m[IU]/mL (ref <5)

## 2019-04-27 NOTE — ED Triage Notes (Signed)
Patient with a few hours history of nausea and just not feeling right.  Patient states she ate something earlier and just has had the nausea since eating.

## 2019-04-28 ENCOUNTER — Encounter (HOSPITAL_COMMUNITY): Payer: Self-pay | Admitting: Emergency Medicine

## 2019-04-28 LAB — CBC
HCT: 40.6 % (ref 36.0–46.0)
Hemoglobin: 12.2 g/dL (ref 12.0–15.0)
MCH: 26.5 pg (ref 26.0–34.0)
MCHC: 30 g/dL (ref 30.0–36.0)
MCV: 88.1 fL (ref 80.0–100.0)
Platelets: 244 10*3/uL (ref 150–400)
RBC: 4.61 MIL/uL (ref 3.87–5.11)
RDW: 18.1 % — ABNORMAL HIGH (ref 11.5–15.5)
WBC: 10.3 10*3/uL (ref 4.0–10.5)
nRBC: 0 % (ref 0.0–0.2)

## 2019-04-28 LAB — COMPREHENSIVE METABOLIC PANEL
ALT: 19 U/L (ref 0–44)
AST: 18 U/L (ref 15–41)
Albumin: 3.7 g/dL (ref 3.5–5.0)
Alkaline Phosphatase: 116 U/L (ref 38–126)
Anion gap: 10 (ref 5–15)
BUN: 12 mg/dL (ref 6–20)
CO2: 23 mmol/L (ref 22–32)
Calcium: 9.3 mg/dL (ref 8.9–10.3)
Chloride: 105 mmol/L (ref 98–111)
Creatinine, Ser: 0.88 mg/dL (ref 0.44–1.00)
GFR calc Af Amer: >60 mL/min (ref >60)
GFR calc non Af Amer: >60 mL/min (ref >60)
Glucose, Bld: 103 mg/dL — ABNORMAL HIGH (ref 70–99)
Potassium: 3.8 mmol/L (ref 3.5–5.1)
Sodium: 138 mmol/L (ref 135–145)
Total Bilirubin: 0.7 mg/dL (ref 0.3–1.2)
Total Protein: 7.1 g/dL (ref 6.5–8.1)

## 2019-04-28 LAB — URINALYSIS, ROUTINE W REFLEX MICROSCOPIC
Bacteria, UA: NONE SEEN
Bilirubin Urine: NEGATIVE
Glucose, UA: NEGATIVE mg/dL
Ketones, ur: NEGATIVE mg/dL
Nitrite: NEGATIVE
Protein, ur: NEGATIVE mg/dL
Specific Gravity, Urine: 1.026 (ref 1.005–1.030)
pH: 6 (ref 5.0–8.0)

## 2019-04-28 LAB — LIPASE, BLOOD: Lipase: 34 U/L (ref 11–51)

## 2019-04-28 MED ORDER — SODIUM CHLORIDE 0.9% FLUSH: 3.0000 mL | Freq: Once | INTRAVENOUS | Status: DC

## 2019-04-28 NOTE — ED Notes (Signed)
PATIENT STATES  THAT IT IS TO LONG OF WAIT.SHE LEFT WITHOUT SEEN

## 2019-05-07 ENCOUNTER — Telehealth (INDEPENDENT_AMBULATORY_CARE_PROVIDER_SITE_OTHER): Payer: Self-pay | Admitting: Primary Care

## 2019-05-07 ENCOUNTER — Other Ambulatory Visit: Payer: Self-pay

## 2019-05-07 DIAGNOSIS — E669 Obesity, unspecified: Secondary | ICD-10-CM

## 2019-05-07 DIAGNOSIS — I10 Essential (primary) hypertension: Secondary | ICD-10-CM

## 2019-05-07 DIAGNOSIS — E049 Nontoxic goiter, unspecified: Secondary | ICD-10-CM

## 2019-05-07 DIAGNOSIS — J302 Other seasonal allergic rhinitis: Secondary | ICD-10-CM

## 2019-05-07 MED ORDER — LORATADINE 10 MG PO TABS: 10.0000 mg | ORAL_TABLET | Freq: Every day | ORAL | 11 refills | Status: DC

## 2019-05-07 MED ORDER — HYDROCHLOROTHIAZIDE 25 MG PO TABS: 25.0000 mg | ORAL_TABLET | Freq: Every day | ORAL | 3 refills | Status: DC

## 2019-05-07 MED ORDER — AMLODIPINE BESYLATE 10 MG PO TABS: 10.0000 mg | ORAL_TABLET | Freq: Every day | ORAL | 3 refills | Status: DC

## 2019-05-07 NOTE — Progress Notes (Signed)
Patient has been called and DOB has been verified. Patient has been screened and transferred to PCP to start phone visit.   Patient has been fatigue.  Patient's BP has been elevated.

## 2019-05-07 NOTE — Progress Notes (Signed)
Virtual Visit via Telephone Note  I connected with Laura Gutierrez on 05/07/19 at  8:50 AM EDT by telephone and verified that I am speaking with the correct person using two identifiers.   I discussed the limitations, risks, security and privacy concerns of performing an evaluation and management service by telephone and the availability of in person appointments. I also discussed with the patient that there may be a patient responsible charge related to this service. The patient expressed understanding and agreed to proceed.   History of Present Illness: Laura Gutierrez is being seen today for concerns that her thyroids are acting up she endorses weight gain, fatigue and constipation. She also feels her blood pressures medication is not working states it remains high- reminded her she was supposed to follow up for Bp check after addition medication was added. She denies shortness of breath, headaches, chest pain or lower extremity edema.   Observations/Objective Review of Systems  Constitutional: Positive for malaise/fatigue.       Gaining weigh  Gastrointestinal: Positive for constipation.   Assessment and Plan: Laura Gutierrez was seen today for fatigue.  Diagnoses and all orders for this visit:  Hypertension, unspecified type DASH diet, medication compliance, 150 minutes of moderate intensity exercise per week. Discussed medication compliance, adverse effects. -     hydrochlorothiazide (HYDRODIURIL) 25 MG tablet; Take 1 tablet (25 mg total) by mouth daily. Take on tablet in the morning.  Obesity, unspecified classification, unspecified obesity type, unspecified whether serious comorbidity present Unspecified weight gain will review weight gain on in person visit. Labs to be obtained is TSH/T4. Also reduction in carbohydrates and exercise vigorously  30 minutes daily.  Thyroid enlargement Labs placed for the future TSH/T4. After results will determine treatment course.  Seasonal  allergies Allergies are common during spring and fall due to  increased pollination.  Antihistamines can be used daily with intrnasal steroid spray limited to 14 days to avoid rebound effects. May use normal saline nasal spray daily.   Other orders -     Discontinue: amLODipine (NORVASC) 10 MG tablet; Take 1 tablet (10 mg total) by mouth daily. -     amLODipine (NORVASC) 10 MG tablet; Take 1 tablet (10 mg total) by mouth daily. -     loratadine (CLARITIN) 10 MG tablet; Take 1 tablet (10 mg total) by mouth daily.    Follow Up Instructions:    I discussed the assessment and treatment plan with the patient. The patient was provided an opportunity to ask questions and all were answered. The patient agreed with the plan and demonstrated an understanding of the instructions.   The patient was advised to call back or seek an in-person evaluation if the symptoms worsen or if the condition fails to improve as anticipated.  I provided 16 minutes of non-face-to-face time during this encounter.   Kerin Perna, NP

## 2019-05-23 ENCOUNTER — Ambulatory Visit (INDEPENDENT_AMBULATORY_CARE_PROVIDER_SITE_OTHER): Payer: Self-pay | Admitting: Primary Care

## 2019-06-04 ENCOUNTER — Other Ambulatory Visit: Payer: Self-pay

## 2019-06-04 ENCOUNTER — Encounter (INDEPENDENT_AMBULATORY_CARE_PROVIDER_SITE_OTHER): Payer: Self-pay | Admitting: Primary Care

## 2019-06-04 ENCOUNTER — Ambulatory Visit (INDEPENDENT_AMBULATORY_CARE_PROVIDER_SITE_OTHER): Payer: Self-pay | Admitting: Primary Care

## 2019-06-04 VITALS — BP 154/109 | HR 95 | Temp 97.5°F | Ht 66.0 in | Wt 344.6 lb

## 2019-06-04 DIAGNOSIS — I10 Essential (primary) hypertension: Secondary | ICD-10-CM

## 2019-06-04 DIAGNOSIS — N898 Other specified noninflammatory disorders of vagina: Secondary | ICD-10-CM

## 2019-06-04 DIAGNOSIS — Z113 Encounter for screening for infections with a predominantly sexual mode of transmission: Secondary | ICD-10-CM

## 2019-06-04 DIAGNOSIS — Z6841 Body Mass Index (BMI) 40.0 and over, adult: Secondary | ICD-10-CM

## 2019-06-04 MED ORDER — METOPROLOL TARTRATE 25 MG PO TABS: 25.0000 mg | ORAL_TABLET | Freq: Two times a day (BID) | ORAL | 3 refills | Status: DC

## 2019-06-04 NOTE — Progress Notes (Signed)
Established Patient Office Visit  Subjective:  Patient ID: Laura Gutierrez, female    DOB: 03/16/1986  Age: 33 y.o. MRN: [id number]  CC:  Chief Complaint  Patient presents with  . Follow-up    hypertension    HPI Laura Gutierrez presents for follow up on hypertension remains elevated despite she took her medication today. Concerned about morbid obesity and associated diseases predisposing to. She recently broke up with her boyfriend and requesting STD testing.  Past Medical History:  Diagnosis Date  . Gonorrhea   . Hypertension   . Obesity     Past Surgical History:  Procedure Laterality Date  . TONSILLECTOMY      Family History  Problem Relation Age of Onset  . Hypertension Mother   . Alzheimer's disease Maternal Aunt   . Heart disease Maternal Grandfather   . Diabetes Paternal Grandmother     Social History   Socioeconomic History  . Marital status: Single    Spouse name: Not on file  . Number of children: Not on file  . Years of education: Not on file  . Highest education level: Not on file  Occupational History  . Not on file  Social Needs  . Financial resource strain: Not on file  . Food insecurity    Worry: Not on file    Inability: Not on file  . Transportation needs    Medical: Not on file    Non-medical: Not on file  Tobacco Use  . Smoking status: Current Every Day Smoker    Packs/day: 0.25    Types: Cigarettes  . Smokeless tobacco: Never Used  Substance and Sexual Activity  . Alcohol use: Yes    Alcohol/week: 0.0 standard drinks    Comment: sometimes    . Drug use: No  . Sexual activity: Yes    Birth control/protection: None  Lifestyle  . Physical activity    Days per week: Not on file    Minutes per session: Not on file  . Stress: Not on file  Relationships  . Social Musicianconnections    Talks on phone: Not on file    Gets together: Not on file    Attends religious service: Not on file    Active member of club or organization: Not on file     Attends meetings of clubs or organizations: Not on file    Relationship status: Not on file  . Intimate partner violence    Fear of current or ex partner: Not on file    Emotionally abused: Not on file    Physically abused: Not on file    Forced sexual activity: Not on file  Other Topics Concern  . Not on file  Social History Narrative  . Not on file    Outpatient Medications Prior to Visit  Medication Sig Dispense Refill  . amLODipine (NORVASC) 10 MG tablet Take 1 tablet (10 mg total) by mouth daily. 30 tablet 3  . hydrochlorothiazide (HYDRODIURIL) 25 MG tablet Take 1 tablet (25 mg total) by mouth daily. Take on tablet in the morning. 30 tablet 3  . loratadine (CLARITIN) 10 MG tablet Take 1 tablet (10 mg total) by mouth daily. 30 tablet 11  . omeprazole (PRILOSEC) 20 MG capsule Take 1 capsule (20 mg total) by mouth daily. 30 capsule 3  . famotidine (PEPCID) 20 MG tablet Take 1 tablet (20 mg total) by mouth 2 (two) times daily. (Patient not taking: Reported on 05/07/2019) 60 tablet 2  . hydrOXYzine (VISTARIL)  25 MG capsule Take 1 capsule (25 mg total) by mouth 3 (three) times daily as needed. (Patient not taking: Reported on 05/07/2019) 30 capsule 0   No facility-administered medications prior to visit.     Allergies  Allergen Reactions  . Bactrim [Sulfamethoxazole-Trimethoprim] Anaphylaxis    ROS Review of Systems  All other systems reviewed and are negative.     Objective:    Physical Exam  Constitutional: She is oriented to person, place, and time. She appears well-developed and well-nourished.  Neck: Neck supple.  Cardiovascular: Normal rate and regular rhythm.  Pulmonary/Chest: Effort normal and breath sounds normal.  Abdominal: Soft. Bowel sounds are normal. She exhibits distension.  Musculoskeletal: Normal range of motion.  Neurological: She is oriented to person, place, and time.  Skin: Skin is warm and dry.  Psychiatric: She has a normal mood and affect.     BP (!) 154/109 (BP Location: Right Arm, Patient Position: Sitting, Cuff Size: Large)   Pulse 95   Temp (!) 97.5 F (36.4 C) (Tympanic)   Ht 5\' 6"  (1.676 m)   Wt (!) 344 lb 9.6 oz (156.3 kg)   LMP 05/28/2019 (Exact Date)   SpO2 97%   BMI 55.62 kg/m  Wt Readings from Last 3 Encounters:  06/04/19 (!) 344 lb 9.6 oz (156.3 kg)  12/21/18 (!) 330 lb 3.2 oz (149.8 kg)  11/20/18 (!) 307 lb (139.3 kg)     Health Maintenance Due  Topic Date Due  . INFLUENZA VACCINE  05/05/2019    There are no preventive care reminders to display for this patient.  Lab Results  Component Value Date   TSH 0.870 06/23/2018   Lab Results  Component Value Date   WBC 10.3 04/28/2019   HGB 12.2 04/28/2019   HCT 40.6 04/28/2019   MCV 88.1 04/28/2019   PLT 244 04/28/2019   Lab Results  Component Value Date   NA 138 04/28/2019   K 3.8 04/28/2019   CO2 23 04/28/2019   GLUCOSE 103 (H) 04/28/2019   BUN 12 04/28/2019   CREATININE 0.88 04/28/2019   BILITOT 0.7 04/28/2019   ALKPHOS 116 04/28/2019   AST 18 04/28/2019   ALT 19 04/28/2019   PROT 7.1 04/28/2019   ALBUMIN 3.7 04/28/2019   CALCIUM 9.3 04/28/2019   ANIONGAP 10 04/28/2019   Lab Results  Component Value Date   CHOL 181 10/22/2011   Lab Results  Component Value Date   HDL 41 10/22/2011   Lab Results  Component Value Date   LDLCALC 121 (H) 10/22/2011   Lab Results  Component Value Date   TRIG 96 10/22/2011   Lab Results  Component Value Date   CHOLHDL 4.4 10/22/2011   No results found for: HGBA1C    Assessment & Plan:  Laura Gutierrez was seen today for follow-up.  Diagnoses and all orders for this visit:  Hypertension, unspecified type Goal BP:  For patients younger than 60: Goal BP < 140/90. For patients 60 and older: Goal BP < 150/90. For patients with diabetes: Goal BP < 140/90. Your most recent BP: (!) 187/112 mmHg.  Take your medications faithfully as instructed. Maintain a healthy weight. Get at least 150  minutes of aerobic exercise per week. Minimize salt intake. Minimize alcohol intake  -     metoprolol tartrate (LOPRESSOR) 25 MG tablet; Take 1 tablet (25 mg total) by mouth 2 (two) times daily.  Class 3 severe obesity due to excess calories with serious comorbidity in adult, unspecified BMI ([place])  Discussed in detail risk factors for stroke, heart attack , diabetes in combination with uncontrolled HTN. Morbid Obesity is BMI greater than 40 indicating an excess in caloric intake or underlining conditions. This may lead to other co-morbidities. Lifestyle modifications of diet and exercise may reduce obesity.   Screening for STD (sexually transmitted disease) Pt counseled regarding condom use with each sexual activity to promote wellness and prevention of transmission of HIV, syphilis, herpes simplex virus, gonorrhea, chlamydia and trichomoniasis..   -     STD Panel -     HIV Antibody (routine testing w rflx)    Meds ordered this encounter  Medications  . metoprolol tartrate (LOPRESSOR) 25 MG tablet    Sig: Take 1 tablet (25 mg total) by mouth 2 (two) times daily.    Dispense:  180 tablet    Refill:  3    Follow-up: Return in about 2 weeks (around 06/18/2019) for added metprolol  Bp remains elevated and HR.    Grayce Sessions, NP

## 2019-06-06 ENCOUNTER — Telehealth (INDEPENDENT_AMBULATORY_CARE_PROVIDER_SITE_OTHER): Payer: Self-pay

## 2019-06-06 ENCOUNTER — Ambulatory Visit (INDEPENDENT_AMBULATORY_CARE_PROVIDER_SITE_OTHER): Payer: Self-pay | Admitting: Primary Care

## 2019-06-06 ENCOUNTER — Other Ambulatory Visit: Payer: Self-pay

## 2019-06-06 ENCOUNTER — Encounter (INDEPENDENT_AMBULATORY_CARE_PROVIDER_SITE_OTHER): Payer: Self-pay | Admitting: Primary Care

## 2019-06-06 VITALS — BP 134/87 | HR 83 | Temp 98.7°F | Ht 66.0 in | Wt 344.6 lb

## 2019-06-06 DIAGNOSIS — I10 Essential (primary) hypertension: Secondary | ICD-10-CM

## 2019-06-06 DIAGNOSIS — B9689 Other specified bacterial agents as the cause of diseases classified elsewhere: Secondary | ICD-10-CM

## 2019-06-06 DIAGNOSIS — N760 Acute vaginitis: Secondary | ICD-10-CM

## 2019-06-06 DIAGNOSIS — A539 Syphilis, unspecified: Secondary | ICD-10-CM

## 2019-06-06 DIAGNOSIS — B75 Trichinellosis: Secondary | ICD-10-CM

## 2019-06-06 LAB — CERVICOVAGINAL ANCILLARY ONLY
Bacterial vaginitis: POSITIVE — AB
Candida vaginitis: NEGATIVE
Chlamydia: NEGATIVE
Neisseria Gonorrhea: NEGATIVE
Trichomonas: POSITIVE — AB

## 2019-06-06 LAB — RPR, QUANT+TP ABS (REFLEX)
Rapid Plasma Reagin, Quant: 1:1 {titer} — ABNORMAL HIGH
T Pallidum Abs: REACTIVE — AB

## 2019-06-06 LAB — RPR: RPR Ser Ql: REACTIVE — AB

## 2019-06-06 LAB — HIV ANTIBODY (ROUTINE TESTING W REFLEX): HIV Screen 4th Generation wRfx: NONREACTIVE

## 2019-06-06 MED ORDER — PENICILLIN G BENZATHINE 2400000 UNIT/4ML IM SUSP
2.4000 10*6.[IU] | Freq: Once | INTRAMUSCULAR | Status: AC
  Administered 2019-06-06: 2400000 [IU] via INTRAMUSCULAR

## 2019-06-06 MED ORDER — METRONIDAZOLE 500 MG PO TABS: 500.0000 mg | ORAL_TABLET | ORAL | 0 refills | Status: AC

## 2019-06-06 NOTE — Telephone Encounter (Signed)
-----   Message from Kerin Perna, NP sent at 06/06/2019  1:54 PM EDT ----- Patient has 2 STD and BV needs to be seen immediately

## 2019-06-06 NOTE — Patient Instructions (Signed)

## 2019-06-06 NOTE — Progress Notes (Signed)
Acute Office Visit  Subjective:    Patient ID: Laura Gutierrez, female    DOB: December 06, 1985, 33 y.o.   MRN: [id number]  Chief Complaint  Patient presents with  . STD treatment    HPI Patient is in today for treatment of syphilis and medication sent in for bacterial vaginosis and trichomonas. She denies any symptoms she had a gut feeling her boyfriend may be cheating and on previous visit requested STD screening.   Past Medical History:  Diagnosis Date  . Gonorrhea   . Hypertension   . Obesity     Past Surgical History:  Procedure Laterality Date  . TONSILLECTOMY      Family History  Problem Relation Age of Onset  . Hypertension Mother   . Alzheimer's disease Maternal Aunt   . Heart disease Maternal Grandfather   . Diabetes Paternal Grandmother     Social History   Socioeconomic History  . Marital status: Single    Spouse name: Not on file  . Number of children: Not on file  . Years of education: Not on file  . Highest education level: Not on file  Occupational History  . Not on file  Social Needs  . Financial resource strain: Not on file  . Food insecurity    Worry: Not on file    Inability: Not on file  . Transportation needs    Medical: Not on file    Non-medical: Not on file  Tobacco Use  . Smoking status: Current Every Day Smoker    Packs/day: 0.25    Types: Cigarettes  . Smokeless tobacco: Never Used  Substance and Sexual Activity  . Alcohol use: Yes    Alcohol/week: 0.0 standard drinks    Comment: sometimes    . Drug use: No  . Sexual activity: Yes    Birth control/protection: None  Lifestyle  . Physical activity    Days per week: Not on file    Minutes per session: Not on file  . Stress: Not on file  Relationships  . Social Musician on phone: Not on file    Gets together: Not on file    Attends religious service: Not on file    Active member of club or organization: Not on file    Attends meetings of clubs or organizations:  Not on file    Relationship status: Not on file  . Intimate partner violence    Fear of current or ex partner: Not on file    Emotionally abused: Not on file    Physically abused: Not on file    Forced sexual activity: Not on file  Other Topics Concern  . Not on file  Social History Narrative  . Not on file    Outpatient Medications Prior to Visit  Medication Sig Dispense Refill  . amLODipine (NORVASC) 10 MG tablet Take 1 tablet (10 mg total) by mouth daily. 30 tablet 3  . hydrochlorothiazide (HYDRODIURIL) 25 MG tablet Take 1 tablet (25 mg total) by mouth daily. Take on tablet in the morning. 30 tablet 3  . loratadine (CLARITIN) 10 MG tablet Take 1 tablet (10 mg total) by mouth daily. 30 tablet 11  . metoprolol tartrate (LOPRESSOR) 25 MG tablet Take 1 tablet (25 mg total) by mouth 2 (two) times daily. 180 tablet 3  . omeprazole (PRILOSEC) 20 MG capsule Take 1 capsule (20 mg total) by mouth daily. 30 capsule 3   No facility-administered medications prior to visit.  Allergies  Allergen Reactions  . Bactrim [Sulfamethoxazole-Trimethoprim] Anaphylaxis    Review of Systems  All other systems reviewed and are negative. Vaginal discharge and odor      Objective:    Physical Exam  Constitutional: She is oriented to person, place, and time. She appears well-developed and well-nourished.  Cardiovascular: Normal rate and regular rhythm.  Pulmonary/Chest: Effort normal and breath sounds normal.  Abdominal: Bowel sounds are normal. She exhibits distension.  Genitourinary:    Vaginal discharge present.   Musculoskeletal: Normal range of motion.  Neurological: She is oriented to person, place, and time.  Skin: Skin is warm.  Psychiatric: She has a normal mood and affect.    Temp 98.7 F (37.1 C) (Tympanic)   Ht 5\' 6"  (1.676 m)   LMP 05/28/2019 (Exact Date)   BMI 55.62 kg/m  Wt Readings from Last 3 Encounters:  06/04/19 (!) 344 lb 9.6 oz (156.3 kg)  12/21/18 (!) 330 lb 3.2  oz (149.8 kg)  11/20/18 (!) 307 lb (139.3 kg)    Health Maintenance Due  Topic Date Due  . PAP SMEAR-Modifier  12/15/2017  . INFLUENZA VACCINE  05/05/2019    There are no preventive care reminders to display for this patient.   Lab Results  Component Value Date   TSH 0.870 06/23/2018   Lab Results  Component Value Date   WBC 10.3 04/28/2019   HGB 12.2 04/28/2019   HCT 40.6 04/28/2019   MCV 88.1 04/28/2019   PLT 244 04/28/2019   Lab Results  Component Value Date   NA 138 04/28/2019   K 3.8 04/28/2019   CO2 23 04/28/2019   GLUCOSE 103 (H) 04/28/2019   BUN 12 04/28/2019   CREATININE 0.88 04/28/2019   BILITOT 0.7 04/28/2019   ALKPHOS 116 04/28/2019   AST 18 04/28/2019   ALT 19 04/28/2019   PROT 7.1 04/28/2019   ALBUMIN 3.7 04/28/2019   CALCIUM 9.3 04/28/2019   ANIONGAP 10 04/28/2019   Lab Results  Component Value Date   CHOL 181 10/22/2011   Lab Results  Component Value Date   HDL 41 10/22/2011   Lab Results  Component Value Date   LDLCALC 121 (H) 10/22/2011   Lab Results  Component Value Date   TRIG 96 10/22/2011   Lab Results  Component Value Date   CHOLHDL 4.4 10/22/2011   No results found for: HGBA1C     Assessment & Plan:  Meleny was seen today for std treatment.  Diagnoses and all orders for this visit:  Syphilis -     Penicillin G Benzathine SUSP 2,400,000 Units  Trichinosis Sexually transmitted disease medication sent to pharmacy [facility]NIDAZOLE (FLAGYL) 500 MG tablet; Take 1 tablet (500 mg total) by mouth 1 day or 1 dose for 1 dose. Take 2000mg  at once (4 500mg  tablets)  Bacterial vaginitis A prescription for metronidazole. You will not need any additional medication .metroNIDAZOLE (FLAGYL) 500 MG tablet; Take 1 tablet (500 mg total) by mouth 1 day or 1 dose for 1 dose. Take 2000mg  at once (4 500mg  tablets)   Be sure that you do not drink alcohol when you take this medication because the combination can give you severe nausea and  vomiting.  It can sometimes give people a metallic taste in their mouth while they are taking it. When you take antibiotics, it can wipe out your gut flora.  This can cause problems like diarrhea.  It would be helpful to restore your gut flora and potentially decrease the  side effects if you were to take either an over-the-counter probiotic such as [employer], Sport and exercise psychologistlorastar or [employer].  I would recommend taking this on a daily basis for at least 3-4 weeks.  Hypertension, unspecified type Elevated today due to the information received on having STI's- expected elevation. Counseled on blood pressure goal of less than 130/80, low-sodium, DASH diet, medication compliance, 150 minutes of moderate intensity exercise per week. Discussed medication compliance, adverse effects.  Other orders -     metroNIDAZOLE (FLAGYL) 500 MG tablet; Take 1 tablet (500 mg total) by mouth 1 day or 1 dose for 1 dose. Take 2000mg  at once (4 500mg  tablets)    Meds ordered this encounter  Medications  . metroNIDAZOLE (FLAGYL) 500 MG tablet    Sig: Take 1 tablet (500 mg total) by mouth 1 day or 1 dose for 1 dose. Take 2000mg  at once (4 500mg  tablets)    Dispense:  4 tablet    Refill:  0     Grayce SessionsMichelle P Ravon Mortellaro, NP

## 2019-06-06 NOTE — Telephone Encounter (Signed)
Patient verified date of birth. Asked patient to come into clinic to discuss results. Patient wanted results over the phone. She was informed that she is positive for BV, trich and syphilis. She will come to clinic today for treatment. Patient aware that she will need to inform partner(s) so that they can be treated as well. Nat Christen, CMA

## 2019-06-22 ENCOUNTER — Ambulatory Visit (INDEPENDENT_AMBULATORY_CARE_PROVIDER_SITE_OTHER): Payer: Self-pay | Admitting: Primary Care

## 2019-07-04 ENCOUNTER — Ambulatory Visit (INDEPENDENT_AMBULATORY_CARE_PROVIDER_SITE_OTHER): Payer: Self-pay | Admitting: Primary Care

## 2019-10-05 NOTE — L&D Delivery Note (Signed)
Delivery Note At 11:48 AM a viable female was delivered via Vaginal, Spontaneous (Presentation: Left Occiput Anterior).  No nuchal cord, but short umbilical cord noted. APGAR: 7, 8; weight 6 lb 6.3 oz (2900 g).   Placenta status: Spontaneous, Intact.  Cord: 3 vessels with the following complications: Short.   Anesthesia: Epidural Episiotomy: None Lacerations: 2nd degree Est. Blood Loss (mL): 200  Given patient body habitus unable to perform laceration repair at bedside despite assistance with retraction. Dr. Macon Large called to bedside to assess and given patient body habitus and that patient unable to tolerate repair due to inadequate analgesia despite epidural and local lidocaine, decision made to complete laceration repair in the OR. Please refer to Op note for further details regarding laceration repair.   Laura Gutierrez 07/13/2020, 5:05 PM

## 2019-12-07 ENCOUNTER — Ambulatory Visit (INDEPENDENT_AMBULATORY_CARE_PROVIDER_SITE_OTHER): Payer: Private Health Insurance - Indemnity | Admitting: *Deleted

## 2019-12-07 ENCOUNTER — Other Ambulatory Visit: Payer: Self-pay

## 2019-12-07 VITALS — BP 167/100 | HR 92

## 2019-12-07 DIAGNOSIS — Z3201 Encounter for pregnancy test, result positive: Secondary | ICD-10-CM

## 2019-12-07 DIAGNOSIS — Z3200 Encounter for pregnancy test, result unknown: Secondary | ICD-10-CM

## 2019-12-07 LAB — POCT URINE PREGNANCY: Preg Test, Ur: POSITIVE — AB

## 2019-12-07 MED ORDER — LABETALOL HCL 200 MG PO TABS: 200.0000 mg | ORAL_TABLET | Freq: Two times a day (BID) | ORAL | 3 refills | Status: DC

## 2019-12-07 MED ORDER — METHYLDOPA-HYDROCHLOROTHIAZIDE 250-15 MG PO TABS: 1.0000 | ORAL_TABLET | Freq: Two times a day (BID) | ORAL | 1 refills | Status: DC

## 2019-12-07 NOTE — Progress Notes (Signed)
Laura Gutierrez presents today for UPT. She has no unusual complaints.  LMP:10/23/2019 EDD: 07/29/20    OBJECTIVE: Appears well, in no apparent distress.  BP (!) 167/100   Pulse 92   LMP 10/23/2019    OB History    Gravida  0   Para      Term      Preterm      AB      Living        SAB      TAB      Ectopic      Multiple      Live Births            Home UPT Result:Positive In-Office UPT result:positive  I have reviewed the patient's allergies and medications.  ASSESSMENT: Positive pregnancy test  PLAN Prenatal care to be completed at: [facility]  Reviewed with Dr Jolayne Panther due to BP Labetalol and Aldoril sent today- pt to verify coverage at pharmacy and pick up Rx that is more cost effective- both noted to be not covered under her plan.  Pt made aware she may call office with any questions/concerns.

## 2019-12-10 NOTE — Progress Notes (Signed)
Patient seen and assessed by nursing staff during this encounter. I have reviewed the chart and agree with the documentation and plan.  Catalina Antigua, MD 12/10/2019 7:59 AM

## 2019-12-11 ENCOUNTER — Telehealth: Payer: Self-pay

## 2019-12-11 ENCOUNTER — Other Ambulatory Visit: Payer: Self-pay | Admitting: Obstetrics and Gynecology

## 2019-12-11 MED ORDER — ALBUTEROL SULFATE HFA 108 (90 BASE) MCG/ACT IN AERS: 2.0000 | INHALATION_SPRAY | Freq: Four times a day (QID) | RESPIRATORY_TRACT | 3 refills | Status: DC | PRN

## 2019-12-11 NOTE — Telephone Encounter (Signed)
Attempted to advise of rx sent and provider suggests covid testing, no answer, vm not setup

## 2019-12-11 NOTE — Telephone Encounter (Signed)
S/w pt and advised of rx sent by provider and recommendations.

## 2019-12-11 NOTE — Telephone Encounter (Signed)
Returned call, pt had appt on 12-07-19 to confirm pregnancy, called to and reports some shortness of breath when climbing stairs, or walking too fast. Pt states that she has had an inhaler in the past and may need another one. Denies chest pain, reports that she is taking labetalol BID. Routed to provider for review.

## 2019-12-28 ENCOUNTER — Telehealth: Payer: Self-pay

## 2019-12-28 NOTE — Telephone Encounter (Signed)
Return call to pt regarding swelling in hands ankles and feet.  Pt denies any blurred vision, No headaches. Pt made aware of comfort measures  I had pt to check B/P wile on the phone Pt noted she had not took B/P yet for the day due to not eating breakfast yet B/P was 153/104.  Pt advised to eat and take Rx Increase water intake, avoid salty foods, elevate feet while resting pt noted she is off of work today.  I asked pt to see how she does after doing all of the above and to re-check B/P And call the office again.  If no change and sx's become severe to report to the hospital due to office closing early.  Pt has upcoming NOB appt  Intake on 01/08/20 and work up on 01/14/20

## 2020-01-08 ENCOUNTER — Other Ambulatory Visit (HOSPITAL_COMMUNITY)
Admission: RE | Admit: 2020-01-08 | Discharge: 2020-01-08 | Disposition: A | Discharge status: Home / Self Care | Payer: Private Health Insurance - Indemnity | Source: Ambulatory Visit | Attending: Obstetrics | Admitting: Obstetrics

## 2020-01-08 ENCOUNTER — Other Ambulatory Visit: Payer: Self-pay

## 2020-01-08 ENCOUNTER — Encounter: Payer: Self-pay | Admitting: Obstetrics

## 2020-01-08 ENCOUNTER — Ambulatory Visit (INDEPENDENT_AMBULATORY_CARE_PROVIDER_SITE_OTHER): Payer: Private Health Insurance - Indemnity | Admitting: Obstetrics

## 2020-01-08 VITALS — BP 133/93 | HR 97 | Wt 369.9 lb

## 2020-01-08 DIAGNOSIS — O10919 Unspecified pre-existing hypertension complicating pregnancy, unspecified trimester: Secondary | ICD-10-CM

## 2020-01-08 DIAGNOSIS — O99210 Obesity complicating pregnancy, unspecified trimester: Secondary | ICD-10-CM

## 2020-01-08 DIAGNOSIS — O0990 Supervision of high risk pregnancy, unspecified, unspecified trimester: Secondary | ICD-10-CM | POA: Diagnosis not present

## 2020-01-08 DIAGNOSIS — Z3481 Encounter for supervision of other normal pregnancy, first trimester: Secondary | ICD-10-CM | POA: Diagnosis not present

## 2020-01-08 DIAGNOSIS — F17200 Nicotine dependence, unspecified, uncomplicated: Secondary | ICD-10-CM

## 2020-01-08 DIAGNOSIS — O99332 Smoking (tobacco) complicating pregnancy, second trimester: Secondary | ICD-10-CM

## 2020-01-08 DIAGNOSIS — J302 Other seasonal allergic rhinitis: Secondary | ICD-10-CM

## 2020-01-08 DIAGNOSIS — Z3A11 11 weeks gestation of pregnancy: Secondary | ICD-10-CM

## 2020-01-08 DIAGNOSIS — K219 Gastro-esophageal reflux disease without esophagitis: Secondary | ICD-10-CM

## 2020-01-08 MED ORDER — OMEPRAZOLE 20 MG PO CPDR: 20.0000 mg | DELAYED_RELEASE_CAPSULE | Freq: Every day | ORAL | 5 refills | Status: DC

## 2020-01-08 MED ORDER — LORATADINE 10 MG PO TABS: 10.0000 mg | ORAL_TABLET | Freq: Every day | ORAL | 11 refills | Status: DC

## 2020-01-08 MED ORDER — LABETALOL HCL 300 MG PO TABS: 300.0000 mg | ORAL_TABLET | Freq: Two times a day (BID) | ORAL | 5 refills | Status: DC

## 2020-01-08 MED ORDER — VITAFOL ULTRA 29-0.6-0.4-200 MG PO CAPS: 1.0000 | ORAL_CAPSULE | Freq: Every day | ORAL | 4 refills | Status: DC

## 2020-01-08 NOTE — Progress Notes (Signed)
Pt is here for initial OB visit. LMP 10/23/19, EDD 07/29/20.

## 2020-01-08 NOTE — Progress Notes (Signed)
Subjective:    Laura Gutierrez is being seen today for her first obstetrical visit.  This is not a planned pregnancy. She is at [redacted]w[redacted]d gestation. Her obstetrical history is significant for obesity and hypertension. Relationship with FOB: significant other, not living together. Patient does intend to breast feed. Pregnancy history fully reviewed.  The information documented in the HPI was reviewed and verified.  Menstrual History: OB History    Gravida  1   Para      Term      Preterm      AB      Living        SAB      TAB      Ectopic      Multiple      Live Births               Patient's last menstrual period was 10/23/2019.    Past Medical History:  Diagnosis Date  . Gonorrhea   . Hypertension   . Obesity     Past Surgical History:  Procedure Laterality Date  . TONSILLECTOMY      (Not in a hospital admission)  Allergies  Allergen Reactions  . Bactrim [Sulfamethoxazole-Trimethoprim] Anaphylaxis    Social History   Tobacco Use  . Smoking status: Current Every Day Smoker    Packs/day: 0.25    Types: Cigarettes  . Smokeless tobacco: Never Used  Substance Use Topics  . Alcohol use: Yes    Alcohol/week: 0.0 standard drinks    Comment: sometimes      Family History  Problem Relation Age of Onset  . Hypertension Mother   . Alzheimer's disease Maternal Aunt   . Heart disease Maternal Grandfather   . Diabetes Paternal Grandmother      Review of Systems Constitutional: negative for weight loss Gastrointestinal: negative for vomiting Genitourinary:negative for genital lesions and vaginal discharge and dysuria Musculoskeletal:negative for back pain Behavioral/Psych: negative for abusive relationship, depression, illegal drug usage and tobacco use    Objective:    BP (!) 133/93   Pulse 97   Wt (!) 369 lb 14.4 oz (167.8 kg)   LMP 10/23/2019   BMI 59.70 kg/m  General Appearance:    Alert, cooperative, no distress, appears stated age  Head:     Normocephalic, without obvious abnormality, atraumatic  Eyes:    PERRL, conjunctiva/corneas clear, EOM's intact, fundi    benign, both eyes  Ears:    Normal TM's and external ear canals, both ears  Nose:   Nares normal, septum midline, mucosa normal, no drainage    or sinus tenderness  Throat:   Lips, mucosa, and tongue normal; teeth and gums normal  Neck:   Supple, symmetrical, trachea midline, no adenopathy;    thyroid:  no enlargement/tenderness/nodules; no carotid   bruit or JVD  Back:     Symmetric, no curvature, ROM normal, no CVA tenderness  Lungs:     Clear to auscultation bilaterally, respirations unlabored  Chest Wall:    No tenderness or deformity   Heart:    Regular rate and rhythm, S1 and S2 normal, no murmur, rub   or gallop  Breast Exam:    No tenderness, masses, or nipple abnormality  Abdomen:     Soft, non-tender, bowel sounds active all four quadrants,    no masses, no organomegaly  Genitalia:    Normal female without lesion, discharge or tenderness  Extremities:   Extremities normal, atraumatic, no cyanosis or edema  Pulses:  2+ and symmetric all extremities  Skin:   Skin color, texture, turgor normal, no rashes or lesions  Lymph nodes:   Cervical, supraclavicular, and axillary nodes normal  Neurologic:   CNII-XII intact, normal strength, sensation and reflexes    throughout      Lab Review Urine pregnancy test Labs reviewed yes Radiologic studies reviewed no  Assessment:    Pregnancy at [redacted]w[redacted]d weeks    Plan:     1. Supervision of high risk pregnancy, antepartum Rx: - Cytology - PAP( [place]) - Cervicovaginal ancillary only( [place]) - Enroll Patient in Babyscripts - Culture, OB Urine - Obstetric Panel, Including HIV - Hepatitis C antibody - Hemoglobin A1c - Prenat-Fe Poly-Methfol-FA-DHA (VITAFOL ULTRA) 29-0.6-0.4-200 MG CAPS; Take 1 capsule by mouth daily before breakfast.  Dispense: 90 capsule; Refill: 4 - [place] MFM OB DETAIL +14 WK;  Future  2. HTN in pregnancy, chronic Rx: - labetalol (NORMODYNE) 300 MG tablet; Take 1 tablet (300 mg total) by mouth 2 (two) times daily.  Dispense: 60 tablet; Refill: 5  3. GERD without esophagitis Rx: - omeprazole (PRILOSEC) 20 MG capsule; Take 1 capsule (20 mg total) by mouth daily.  Dispense: 30 capsule; Refill: 5  4. Obesity affecting pregnancy, antepartum  5. Seasonal allergies Rx: - loratadine (CLARITIN) 10 MG tablet; Take 1 tablet (10 mg total) by mouth daily.  Dispense: 30 tablet; Refill: 11  6. Tobacco dependence - tobacco cessation recommended   Prenatal vitamins.  Counseling provided regarding continued use of seat belts, cessation of alcohol consumption, smoking or use of illicit drugs; infection precautions i.e., influenza/TDAP immunizations, toxoplasmosis,CMV, parvovirus, listeria and varicella; workplace safety, exercise during pregnancy; routine dental care, safe medications, sexual activity, hot tubs, saunas, pools, travel, caffeine use, fish and methlymercury, potential toxins, hair treatments, varicose veins Weight gain recommendations per IOM guidelines reviewed: underweight/BMI< 18.5--> gain 28 - 40 lbs; normal weight/BMI 18.5 - 24.9--> gain 25 - 35 lbs; overweight/BMI 25 - 29.9--> gain 15 - 25 lbs; obese/BMI >30->gain  11 - 20 lbs Problem list reviewed and updated. FIRST/CF mutation testing/NIPT/QUAD SCREEN/fragile X/Ashkenazi Jewish population testing/Spinal muscular atrophy discussed: requested. Role of ultrasound in pregnancy discussed; fetal survey: requested. Amniocentesis discussed: not indicated.  Meds ordered this encounter  Medications  . labetalol (NORMODYNE) 300 MG tablet    Sig: Take 1 tablet (300 mg total) by mouth 2 (two) times daily.    Dispense:  60 tablet    Refill:  5  . loratadine (CLARITIN) 10 MG tablet    Sig: Take 1 tablet (10 mg total) by mouth daily.    Dispense:  30 tablet    Refill:  11  . omeprazole (PRILOSEC) 20 MG capsule     Sig: Take 1 capsule (20 mg total) by mouth daily.    Dispense:  30 capsule    Refill:  5  . Prenat-Fe Poly-Methfol-FA-DHA (VITAFOL ULTRA) 29-0.6-0.4-200 MG CAPS    Sig: Take 1 capsule by mouth daily before breakfast.    Dispense:  90 capsule    Refill:  4   Orders Placed This Encounter  Procedures  . Culture, OB Urine  . [place] MFM OB DETAIL +14 WK    Standing Status:   Future    Standing Expiration Date:   03/09/2021    Order Specific Question:   Reason for Exam (SYMPTOM  OR DIAGNOSIS REQUIRED)    Answer:   Anatomy    Order Specific Question:   Preferred Location    Answer:  [facility]  . Obstetric Panel, Including HIV  . Hepatitis C antibody  . Hemoglobin A1c    Follow up in 4 weeks. 50% of 25 min visit spent on counseling and coordination of care.    Shelly Bombard, MD 01/08/2020 2:58 PM

## 2020-01-09 ENCOUNTER — Other Ambulatory Visit: Payer: Self-pay | Admitting: Obstetrics

## 2020-01-09 DIAGNOSIS — R899 Unspecified abnormal finding in specimens from other organs, systems and tissues: Secondary | ICD-10-CM

## 2020-01-09 LAB — OBSTETRIC PANEL, INCLUDING HIV
Antibody Screen: NEGATIVE
Basophils Absolute: 0.1 10*3/uL (ref 0.0–0.2)
Basos: 1 %
EOS (ABSOLUTE): 0.2 10*3/uL (ref 0.0–0.4)
Eos: 2 %
HIV Screen 4th Generation wRfx: NONREACTIVE
Hematocrit: 40.1 % (ref 34.0–46.6)
Hemoglobin: 12.3 g/dL (ref 11.1–15.9)
Hepatitis B Surface Ag: NEGATIVE
Immature Grans (Abs): 0.1 10*3/uL (ref 0.0–0.1)
Immature Granulocytes: 1 %
Lymphocytes Absolute: 3.2 10*3/uL — ABNORMAL HIGH (ref 0.7–3.1)
Lymphs: 27 %
MCH: 28.1 pg (ref 26.6–33.0)
MCHC: 30.7 g/dL — ABNORMAL LOW (ref 31.5–35.7)
MCV: 92 fL (ref 79–97)
Monocytes Absolute: 1.1 10*3/uL — ABNORMAL HIGH (ref 0.1–0.9)
Monocytes: 9 %
Neutrophils Absolute: 7.3 10*3/uL — ABNORMAL HIGH (ref 1.4–7.0)
Neutrophils: 60 %
Platelets: 206 10*3/uL (ref 150–450)
RBC: 4.38 x10E6/uL (ref 3.77–5.28)
RDW: 17.9 % — ABNORMAL HIGH (ref 11.7–15.4)
RPR Ser Ql: NONREACTIVE
Rh Factor: POSITIVE
Rubella Antibodies, IGG: 3.18 index (ref >0.99)
WBC: 11.9 10*3/uL — ABNORMAL HIGH (ref 3.4–10.8)

## 2020-01-09 LAB — CERVICOVAGINAL ANCILLARY ONLY
Chlamydia: NEGATIVE
Comment: NEGATIVE
Comment: NORMAL
Neisseria Gonorrhea: NEGATIVE

## 2020-01-09 LAB — HEMOGLOBIN A1C
Est. average glucose Bld gHb Est-mCnc: 120 mg/dL
Hgb A1c MFr Bld: 5.8 % — ABNORMAL HIGH (ref 4.8–5.6)

## 2020-01-09 LAB — HEPATITIS C ANTIBODY: Hep C Virus Ab: <0.1 s/co ratio (ref 0.0–0.9)

## 2020-01-11 LAB — CULTURE, OB URINE

## 2020-01-11 LAB — URINE CULTURE, OB REFLEX

## 2020-01-14 ENCOUNTER — Encounter: Payer: Private Health Insurance - Indemnity | Admitting: Obstetrics and Gynecology

## 2020-01-14 LAB — CYTOLOGY - PAP
Adequacy: ABSENT
Comment: NEGATIVE
Diagnosis: NEGATIVE
High risk HPV: NEGATIVE

## 2020-02-05 ENCOUNTER — Ambulatory Visit (INDEPENDENT_AMBULATORY_CARE_PROVIDER_SITE_OTHER): Payer: Private Health Insurance - Indemnity | Admitting: Family Medicine

## 2020-02-05 ENCOUNTER — Other Ambulatory Visit: Payer: Self-pay

## 2020-02-05 ENCOUNTER — Other Ambulatory Visit: Payer: Private Health Insurance - Indemnity

## 2020-02-05 VITALS — BP 126/80 | HR 99 | Wt 379.7 lb

## 2020-02-05 DIAGNOSIS — O99212 Obesity complicating pregnancy, second trimester: Secondary | ICD-10-CM

## 2020-02-05 DIAGNOSIS — K219 Gastro-esophageal reflux disease without esophagitis: Secondary | ICD-10-CM

## 2020-02-05 DIAGNOSIS — K5900 Constipation, unspecified: Secondary | ICD-10-CM

## 2020-02-05 DIAGNOSIS — Z3A15 15 weeks gestation of pregnancy: Secondary | ICD-10-CM

## 2020-02-05 DIAGNOSIS — O99210 Obesity complicating pregnancy, unspecified trimester: Secondary | ICD-10-CM

## 2020-02-05 DIAGNOSIS — O99332 Smoking (tobacco) complicating pregnancy, second trimester: Secondary | ICD-10-CM

## 2020-02-05 DIAGNOSIS — O10919 Unspecified pre-existing hypertension complicating pregnancy, unspecified trimester: Secondary | ICD-10-CM

## 2020-02-05 DIAGNOSIS — O0990 Supervision of high risk pregnancy, unspecified, unspecified trimester: Secondary | ICD-10-CM

## 2020-02-05 DIAGNOSIS — F17200 Nicotine dependence, unspecified, uncomplicated: Secondary | ICD-10-CM

## 2020-02-05 DIAGNOSIS — O10912 Unspecified pre-existing hypertension complicating pregnancy, second trimester: Secondary | ICD-10-CM

## 2020-02-05 DIAGNOSIS — O99612 Diseases of the digestive system complicating pregnancy, second trimester: Secondary | ICD-10-CM

## 2020-02-05 MED ORDER — ASPIRIN EC 81 MG PO TBEC: 81.0000 mg | DELAYED_RELEASE_TABLET | Freq: Every day | ORAL | 2 refills | Status: DC

## 2020-02-05 MED ORDER — POLYETHYLENE GLYCOL 3350 17 G PO PACK: 17.0000 g | PACK | Freq: Every day | ORAL | 0 refills | Status: DC

## 2020-02-05 NOTE — Progress Notes (Signed)
Pt is here for ROB, [redacted]w[redacted]d. AFP and genetic screening blood work today.

## 2020-02-05 NOTE — Progress Notes (Signed)
PRENATAL VISIT NOTE  Subjective:  Laura Gutierrez is a 34 y.o. G1P0 at [redacted]w[redacted]d being seen today for ongoing prenatal care.  She is currently monitored for the following issues for this high-risk pregnancy and has Hypertension; Obesity with serious comorbidity; and Supervision of high risk pregnancy, antepartum on their problem list.  Patient reports noticing more swelling in her legs after a long day of work and also constipation.  Contractions: Not present. Vag. Bleeding: None.   . Denies leaking of fluid.   The following portions of the patient's history were reviewed and updated as appropriate: allergies, current medications, past family history, past medical history, past social history, past surgical history and problem list.   Objective:   Vitals:   02/05/20 0848  BP: 126/80  Pulse: 99  Weight: (!) 379 lb 11.2 oz (172.2 kg)    Fetal Status: Fetal Heart Rate (bpm): 155         General:  Alert, oriented and cooperative. Patient is in no acute distress.  Skin: Skin is warm and dry. No rash noted.   Cardiovascular: Normal heart rate noted  Respiratory: Normal respiratory effort, no problems with respiration noted  Abdomen: Soft, gravid, appropriate for gestational age.  Pain/Pressure: Absent     Pelvic: Cervical exam deferred        Extremities: Normal range of motion.  Edema: Trace  Mental Status: Normal mood and affect. Normal behavior. Normal judgment and thought content.   Assessment and Plan:  Pregnancy: G1P0 at [redacted]w[redacted]d Sheldon was seen today for routine prenatal visit.  Diagnoses and all orders for this visit:  Supervision of high risk pregnancy, antepartum -     Genetic Screening -     AFP, Serum, Open Spina Bifida -     Glucose Tolerance, 2 Hours w/1 Hour -     CBC -     RPR -     HIV Antibody (routine testing w rflx) - CMP and Pr/Cr added on to labs today - A1c 5.8 (pre-diabetic); early GTT today; referred to Dietitian   HTN in pregnancy, chronic       - Cont  Labetalol 300 mg BID (was previously on HCTZ prior to pregnancy)       - Has BP cuff at home; BP's at goal       - ASA initiated today  Tobacco dependence       - Now down to 3 cigarettes daily and continues to be motivated to quit completely; discussed risks of FGR with HTN and tobacco use  Obesity affecting pregnancy, antepartum -     Comprehensive metabolic panel -     Protein / creatinine ratio, urine -     aspirin EC 81 MG tablet; Take 1 tablet (81 mg total) by mouth daily. Take after 12 weeks for prevention of preeclampsia later in pregnancy - Patient reports frustration with weight gain in pregnancy thus far; referred to dietitian  Wt Readings from Last 3 Encounters:  02/05/20 (!) 379 lb 11.2 oz (172.2 kg)  01/08/20 (!) 369 lb 14.4 oz (167.8 kg)  06/06/19 (!) 344 lb 9.6 oz (156.3 kg)   GERD without esophagitis       - Cont PPI PRN  Constipation during pregnancy in second trimester -     polyethylene glycol (MIRALAX MIX-IN PAX) 17 g packet; Take 17 g by mouth daily.  Preterm labor symptoms and general obstetric precautions including but not limited to vaginal bleeding, contractions, leaking of fluid and fetal movement  were reviewed in detail with the patient. Please refer to After Visit Summary for other counseling recommendations.   Return in about 4 weeks (around 03/04/2020) for [facility]; in-person or virtual per patient request.  Future Appointments  Date Time Provider Department Center  02/05/2020 10:15 AM Najir Roop, Hoyle Sauer, MD [facility] None  03/04/2020 10:30 AM [facility]    Joselyn Arrow, MD

## 2020-02-05 NOTE — Patient Instructions (Signed)

## 2020-02-06 LAB — COMPREHENSIVE METABOLIC PANEL
ALT: 49 IU/L — ABNORMAL HIGH (ref 0–32)
AST: 33 IU/L (ref 0–40)
Albumin/Globulin Ratio: 2.2 (ref 1.2–2.2)
Albumin: 3.7 g/dL — ABNORMAL LOW (ref 3.8–4.8)
Alkaline Phosphatase: 84 IU/L (ref 39–117)
BUN/Creatinine Ratio: 11 (ref 9–23)
BUN: 7 mg/dL (ref 6–20)
Bilirubin Total: <0.2 mg/dL (ref 0.0–1.2)
CO2: 24 mmol/L (ref 20–29)
Calcium: 9 mg/dL (ref 8.7–10.2)
Chloride: 102 mmol/L (ref 96–106)
Creatinine, Ser: 0.66 mg/dL (ref 0.57–1.00)
GFR calc Af Amer: 135 mL/min/{1.73_m2} (ref >59)
GFR calc non Af Amer: 117 mL/min/{1.73_m2} (ref >59)
Globulin, Total: 1.7 g/dL (ref 1.5–4.5)
Glucose: 98 mg/dL (ref 65–99)
Potassium: 3.7 mmol/L (ref 3.5–5.2)
Sodium: 137 mmol/L (ref 134–144)
Total Protein: 5.4 g/dL — ABNORMAL LOW (ref 6.0–8.5)

## 2020-02-06 LAB — PROTEIN / CREATININE RATIO, URINE
Creatinine, Urine: 218.6 mg/dL
Protein, Ur: 18.1 mg/dL
Protein/Creat Ratio: 83 mg/g creat (ref 0–200)

## 2020-02-08 LAB — AFP, SERUM, OPEN SPINA BIFIDA
AFP MoM: 0.65
AFP Value: 13.4 ng/mL
Gest. Age on Collection Date: 15 weeks
Maternal Age At EDD: 32.7 yr
OSBR Risk 1 IN: 10000
Test Results:: NEGATIVE
Weight: 379 [lb_av]

## 2020-02-08 LAB — CBC
Hematocrit: 38 % (ref 34.0–46.6)
Hemoglobin: 12.1 g/dL (ref 11.1–15.9)
MCH: 28.9 pg (ref 26.6–33.0)
MCHC: 31.8 g/dL (ref 31.5–35.7)
MCV: 91 fL (ref 79–97)
Platelets: 210 10*3/uL (ref 150–450)
RBC: 4.18 x10E6/uL (ref 3.77–5.28)
RDW: 16.5 % — ABNORMAL HIGH (ref 11.7–15.4)
WBC: 11.6 10*3/uL — ABNORMAL HIGH (ref 3.4–10.8)

## 2020-02-08 LAB — GLUCOSE TOLERANCE, 2 HOURS W/ 1HR
Glucose, 1 hour: 163 mg/dL (ref 65–179)
Glucose, 2 hour: 100 mg/dL (ref 65–152)
Glucose, Fasting: 94 mg/dL — ABNORMAL HIGH (ref 65–91)

## 2020-02-08 LAB — RPR: RPR Ser Ql: NONREACTIVE

## 2020-02-08 LAB — HIV ANTIBODY (ROUTINE TESTING W REFLEX): HIV Screen 4th Generation wRfx: NONREACTIVE

## 2020-02-13 ENCOUNTER — Encounter: Payer: Self-pay | Admitting: Obstetrics and Gynecology

## 2020-02-13 ENCOUNTER — Telehealth (INDEPENDENT_AMBULATORY_CARE_PROVIDER_SITE_OTHER): Payer: Medicaid Other | Admitting: Obstetrics and Gynecology

## 2020-02-13 DIAGNOSIS — O0990 Supervision of high risk pregnancy, unspecified, unspecified trimester: Secondary | ICD-10-CM

## 2020-02-13 DIAGNOSIS — I10 Essential (primary) hypertension: Secondary | ICD-10-CM

## 2020-02-13 DIAGNOSIS — Z3A16 16 weeks gestation of pregnancy: Secondary | ICD-10-CM

## 2020-02-13 DIAGNOSIS — O99212 Obesity complicating pregnancy, second trimester: Secondary | ICD-10-CM

## 2020-02-13 DIAGNOSIS — O10912 Unspecified pre-existing hypertension complicating pregnancy, second trimester: Secondary | ICD-10-CM

## 2020-02-13 MED ORDER — BLOOD PRESSURE KIT DEVI
1.0000 | 0 refills | Status: DC
Start: 2020-02-13 — End: 2020-07-15

## 2020-02-13 NOTE — Addendum Note (Signed)
Addended by: Maretta Bees on: 02/13/2020 01:52 PM   Modules accepted: Orders

## 2020-02-13 NOTE — Progress Notes (Signed)
   OBSTETRICS PRENATAL VIRTUAL VISIT ENCOUNTER NOTE  Provider location: Center for [facility]   I connected with Laura Gutierrez on 02/13/20 at  1:15 PM EDT by MyChart Video Encounter at home and verified that I am speaking with the correct person using two identifiers.   I discussed the limitations, risks, security and privacy concerns of performing an evaluation and management service virtually and the availability of in person appointments. I also discussed with the patient that there may be a patient responsible charge related to this service. The patient expressed understanding and agreed to proceed. Subjective:  Laura Gutierrez is a 34 y.o. G1P0 at [redacted]w[redacted]d being seen today for ongoing prenatal care.  She is currently monitored for the following issues for this high-risk pregnancy and has Hypertension; Obesity with serious comorbidity; and Supervision of high risk pregnancy, antepartum on their problem list.  Patient reports constipation.  Contractions: Not present. Vag. Bleeding: None.   . Denies any leaking of fluid.   The following portions of the patient's history were reviewed and updated as appropriate: allergies, current medications, past family history, past medical history, past social history, past surgical history and problem list.   Objective:  There were no vitals filed for this visit.  Fetal Status:           General:  Alert, oriented and cooperative. Patient is in no acute distress.  Respiratory: Normal respiratory effort, no problems with respiration noted  Mental Status: Normal mood and affect. Normal behavior. Normal judgment and thought content.  Rest of physical exam deferred due to type of encounter  Imaging: No results found.  Assessment and Plan:  Pregnancy: G1P0 at [redacted]w[redacted]d  1. 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 Class 3 severe obesity due to excess calories with serious comorbidity in adult, unspecified BMI (HCC)  3. Hypertension, unspecified type Cont labetalol 300 mg BID Cont baby ASA Does not have BP cuff with her now, will take when she gets home   Preterm labor symptoms and general obstetric precautions including but not limited to vaginal bleeding, contractions, leaking of fluid and fetal movement were reviewed in detail with the patient. I discussed the assessment and treatment plan with the patient. The patient was provided an opportunity to ask questions and all were answered. The patient agreed with the plan and demonstrated an understanding of the instructions. The patient was advised to call back or seek an in-person office evaluation/go to [facility]U at [facility] for any urgent or concerning symptoms. Please refer to After Visit Summary for other counseling recommendations.   I provided 12 minutes of face-to-face time during this encounter.  Return in about 4 weeks (around 03/12/2020) for high OB, virtual.  Future Appointments  Date Time Provider Department Center  03/04/2020  9:45 AM Malachy Chamber, MD [facility] None  03/04/2020 10:30 AM [facility]    Conan Bowens, MD Center for [facility], [facility]

## 2020-02-25 ENCOUNTER — Telehealth: Payer: Self-pay | Admitting: *Deleted

## 2020-02-25 NOTE — Telephone Encounter (Signed)
Pt mother called to office stating she has several concerns for her daughter and her care during pregnancy.  Mother states she is concerned about pt BP - not on fluid pill like she was before, concerned that she has sleep apnea and that pt and baby are not getting the oxygen they need and that pt is overweight and may be a factor.   Attempt to contact pt to discuss.  No answer. LVM making pt aware that these are concerns that will need to be discussed with a provider.  Advised that she may try to make an additional Virtual visit so that her mother may accompany her for visit to discuss these concerns.  Advised that she may contact office if otherwise needed.

## 2020-02-26 ENCOUNTER — Telehealth (INDEPENDENT_AMBULATORY_CARE_PROVIDER_SITE_OTHER): Payer: Medicaid Other | Admitting: Obstetrics & Gynecology

## 2020-02-26 VITALS — BP 153/96 | HR 91

## 2020-02-26 DIAGNOSIS — G4719 Other hypersomnia: Secondary | ICD-10-CM | POA: Diagnosis not present

## 2020-02-26 DIAGNOSIS — O99352 Diseases of the nervous system complicating pregnancy, second trimester: Secondary | ICD-10-CM | POA: Diagnosis not present

## 2020-02-26 DIAGNOSIS — O0990 Supervision of high risk pregnancy, unspecified, unspecified trimester: Secondary | ICD-10-CM

## 2020-02-26 DIAGNOSIS — O99212 Obesity complicating pregnancy, second trimester: Secondary | ICD-10-CM | POA: Diagnosis not present

## 2020-02-26 DIAGNOSIS — G4710 Hypersomnia, unspecified: Secondary | ICD-10-CM

## 2020-02-26 DIAGNOSIS — Z3A18 18 weeks gestation of pregnancy: Secondary | ICD-10-CM | POA: Diagnosis not present

## 2020-02-26 NOTE — Progress Notes (Signed)
S/w pt for virtual visit. Pt reports increased swelling in feet and hands, fingers are "tingly" at times. Pt is feeling fetal movement. Pt states that her mother reports that she stops breathing in her sleep and may have sleep apnea. BP elevated today, pt denies headaches, dizziness and blurred vision.

## 2020-02-26 NOTE — Progress Notes (Signed)
   TELEHEALTH OBSTETRICS VISIT ENCOUNTER NOTE  I connected with Laura Gutierrez on 02/26/20 at  2:45 PM EDT by telephone at home and verified that I am speaking with the correct person using two identifiers.   I discussed the limitations, risks, security and privacy concerns of performing an evaluation and management service by telephone and the availability of in person appointments. I also discussed with the patient that there may be a patient responsible charge related to this service. The patient expressed understanding and agreed to proceed.  Subjective:  Laura Gutierrez is a 34 y.o. G1P0 at [redacted]w[redacted]d being followed for ongoing prenatal care.  She is currently monitored for the following issues for this high-risk pregnancy and has Hypertension; Obesity with serious comorbidity; and Supervision of high risk pregnancy, antepartum on their problem list.  Patient reports carpal tunnel symptoms, fatigue, occasional contractions and vaginal irritation. Reports fetal movement. Denies any contractions, bleeding or leaking of fluid.   The following portions of the patient's history were reviewed and updated as appropriate: allergies, current medications, past family history, past medical history, past social history, past surgical history and problem list.   Objective:   General:  Alert, oriented and cooperative.   Mental Status: Normal mood and affect perceived. Normal judgment and thought content.  Rest of physical exam deferred due to type of encounter  Assessment and Plan:  Pregnancy: G1P0 at [redacted]w[redacted]d 1. Supervision of high risk pregnancy, antepartum Mother doing well concern for SOA - Nocturnal polysomnography; Future  2. Hypersomnia with sleep apnea Sleep study ordered - Nocturnal polysomnography; Future  Preterm labor symptoms and general obstetric precautions including but not limited to vaginal bleeding, contractions, leaking of fluid and fetal movement were reviewed in detail with the  patient.  I discussed the assessment and treatment plan with the patient. The patient was provided an opportunity to ask questions and all were answered. The patient agreed with the plan and demonstrated an understanding of the instructions. The patient was advised to call back or seek an in-person office evaluation/go to MAU at [facility] for any urgent or concerning symptoms. Please refer to After Visit Summary for other counseling recommendations.   I provided 20 minutes of non-face-to-face time during this encounter.  No follow-ups on file.  Future Appointments  Date Time Provider Department Center  03/04/2020 10:30 AM [facility]  03/25/2020 10:15 AM Malachy Chamber, MD [facility] None    Malachy Chamber, MD Center for [facility], [facility]

## 2020-03-04 ENCOUNTER — Other Ambulatory Visit: Payer: Self-pay

## 2020-03-04 ENCOUNTER — Telehealth: Payer: Private Health Insurance - Indemnity | Admitting: Obstetrics & Gynecology

## 2020-03-04 ENCOUNTER — Ambulatory Visit: Payer: Medicaid Other | Admitting: *Deleted

## 2020-03-04 ENCOUNTER — Other Ambulatory Visit: Payer: Self-pay | Admitting: *Deleted

## 2020-03-04 ENCOUNTER — Ambulatory Visit (HOSPITAL_COMMUNITY): Payer: Medicaid Other | Attending: Obstetrics and Gynecology

## 2020-03-04 ENCOUNTER — Encounter: Payer: Self-pay | Admitting: *Deleted

## 2020-03-04 DIAGNOSIS — F17200 Nicotine dependence, unspecified, uncomplicated: Secondary | ICD-10-CM

## 2020-03-04 DIAGNOSIS — O99332 Smoking (tobacco) complicating pregnancy, second trimester: Secondary | ICD-10-CM

## 2020-03-04 DIAGNOSIS — Z3A19 19 weeks gestation of pregnancy: Secondary | ICD-10-CM

## 2020-03-04 DIAGNOSIS — O10012 Pre-existing essential hypertension complicating pregnancy, second trimester: Secondary | ICD-10-CM | POA: Diagnosis not present

## 2020-03-04 DIAGNOSIS — O99212 Obesity complicating pregnancy, second trimester: Secondary | ICD-10-CM | POA: Diagnosis not present

## 2020-03-04 DIAGNOSIS — O0990 Supervision of high risk pregnancy, unspecified, unspecified trimester: Secondary | ICD-10-CM | POA: Diagnosis not present

## 2020-03-04 DIAGNOSIS — Z363 Encounter for antenatal screening for malformations: Secondary | ICD-10-CM

## 2020-03-04 DIAGNOSIS — Z362 Encounter for other antenatal screening follow-up: Secondary | ICD-10-CM

## 2020-03-11 ENCOUNTER — Other Ambulatory Visit (HOSPITAL_COMMUNITY)
Admission: RE | Admit: 2020-03-11 | Discharge: 2020-03-11 | Disposition: A | Discharge status: Home / Self Care | Payer: Medicaid Other | Source: Ambulatory Visit | Attending: Internal Medicine | Admitting: Internal Medicine

## 2020-03-11 DIAGNOSIS — Z20822 Contact with and (suspected) exposure to covid-19: Secondary | ICD-10-CM | POA: Insufficient documentation

## 2020-03-11 DIAGNOSIS — Z01812 Encounter for preprocedural laboratory examination: Secondary | ICD-10-CM | POA: Diagnosis not present

## 2020-03-11 LAB — SARS CORONAVIRUS 2 (TAT 6-24 HRS): SARS Coronavirus 2: NEGATIVE

## 2020-03-12 ENCOUNTER — Inpatient Hospital Stay (EMERGENCY_DEPARTMENT_HOSPITAL)
Admission: AD | Admit: 2020-03-12 | Discharge: 2020-03-12 | Disposition: A | Discharge status: Home / Self Care | Payer: Medicaid Other | Source: Home / Self Care | Attending: Obstetrics and Gynecology | Admitting: Obstetrics and Gynecology

## 2020-03-12 ENCOUNTER — Other Ambulatory Visit: Payer: Self-pay | Admitting: Obstetrics & Gynecology

## 2020-03-12 ENCOUNTER — Emergency Department (HOSPITAL_COMMUNITY)
Admission: EM | Admit: 2020-03-12 | Discharge: 2020-03-12 | Disposition: A | Discharge status: Home / Self Care | Payer: Medicaid Other | Attending: Emergency Medicine | Admitting: Emergency Medicine

## 2020-03-12 ENCOUNTER — Other Ambulatory Visit: Payer: Self-pay | Admitting: Obstetrics and Gynecology

## 2020-03-12 ENCOUNTER — Encounter (HOSPITAL_COMMUNITY): Payer: Self-pay | Admitting: Emergency Medicine

## 2020-03-12 ENCOUNTER — Other Ambulatory Visit: Payer: Self-pay

## 2020-03-12 ENCOUNTER — Encounter (HOSPITAL_COMMUNITY): Payer: Self-pay | Admitting: Obstetrics and Gynecology

## 2020-03-12 ENCOUNTER — Telehealth: Payer: Self-pay

## 2020-03-12 DIAGNOSIS — O99891 Other specified diseases and conditions complicating pregnancy: Secondary | ICD-10-CM | POA: Diagnosis not present

## 2020-03-12 DIAGNOSIS — O99332 Smoking (tobacco) complicating pregnancy, second trimester: Secondary | ICD-10-CM | POA: Insufficient documentation

## 2020-03-12 DIAGNOSIS — R0602 Shortness of breath: Secondary | ICD-10-CM | POA: Diagnosis not present

## 2020-03-12 DIAGNOSIS — Z3A20 20 weeks gestation of pregnancy: Secondary | ICD-10-CM | POA: Diagnosis not present

## 2020-03-12 DIAGNOSIS — O10012 Pre-existing essential hypertension complicating pregnancy, second trimester: Secondary | ICD-10-CM

## 2020-03-12 DIAGNOSIS — Z7982 Long term (current) use of aspirin: Secondary | ICD-10-CM | POA: Insufficient documentation

## 2020-03-12 DIAGNOSIS — O99212 Obesity complicating pregnancy, second trimester: Secondary | ICD-10-CM

## 2020-03-12 DIAGNOSIS — F17210 Nicotine dependence, cigarettes, uncomplicated: Secondary | ICD-10-CM | POA: Insufficient documentation

## 2020-03-12 DIAGNOSIS — E669 Obesity, unspecified: Secondary | ICD-10-CM | POA: Diagnosis not present

## 2020-03-12 DIAGNOSIS — O10919 Unspecified pre-existing hypertension complicating pregnancy, unspecified trimester: Secondary | ICD-10-CM

## 2020-03-12 DIAGNOSIS — Z79899 Other long term (current) drug therapy: Secondary | ICD-10-CM | POA: Insufficient documentation

## 2020-03-12 DIAGNOSIS — Z882 Allergy status to sulfonamides status: Secondary | ICD-10-CM | POA: Insufficient documentation

## 2020-03-12 DIAGNOSIS — Z8249 Family history of ischemic heart disease and other diseases of the circulatory system: Secondary | ICD-10-CM | POA: Insufficient documentation

## 2020-03-12 DIAGNOSIS — R600 Localized edema: Secondary | ICD-10-CM

## 2020-03-12 DIAGNOSIS — G4734 Idiopathic sleep related nonobstructive alveolar hypoventilation: Secondary | ICD-10-CM

## 2020-03-12 DIAGNOSIS — O1202 Gestational edema, second trimester: Secondary | ICD-10-CM | POA: Insufficient documentation

## 2020-03-12 LAB — CBC
HCT: 36.6 % (ref 36.0–46.0)
Hemoglobin: 11.2 g/dL — ABNORMAL LOW (ref 12.0–15.0)
MCH: 29.6 pg (ref 26.0–34.0)
MCHC: 30.6 g/dL (ref 30.0–36.0)
MCV: 96.8 fL (ref 80.0–100.0)
Platelets: 200 10*3/uL (ref 150–400)
RBC: 3.78 MIL/uL — ABNORMAL LOW (ref 3.87–5.11)
RDW: 15.5 % (ref 11.5–15.5)
WBC: 11.3 10*3/uL — ABNORMAL HIGH (ref 4.0–10.5)
nRBC: 0 % (ref 0.0–0.2)

## 2020-03-12 LAB — URINALYSIS, ROUTINE W REFLEX MICROSCOPIC
Bilirubin Urine: NEGATIVE
Bilirubin Urine: NEGATIVE
Glucose, UA: NEGATIVE mg/dL
Glucose, UA: NEGATIVE mg/dL
Hgb urine dipstick: NEGATIVE
Hgb urine dipstick: NEGATIVE
Ketones, ur: NEGATIVE mg/dL
Ketones, ur: NEGATIVE mg/dL
Leukocytes,Ua: NEGATIVE
Leukocytes,Ua: NEGATIVE
Nitrite: NEGATIVE
Nitrite: NEGATIVE
Protein, ur: NEGATIVE mg/dL
Protein, ur: NEGATIVE mg/dL
Specific Gravity, Urine: 1.023 (ref 1.005–1.030)
Specific Gravity, Urine: 1.023 (ref 1.005–1.030)
pH: 6 (ref 5.0–8.0)
pH: 6 (ref 5.0–8.0)

## 2020-03-12 LAB — HEPATIC FUNCTION PANEL
ALT: 26 U/L (ref 0–44)
AST: 25 U/L (ref 15–41)
Albumin: 2.8 g/dL — ABNORMAL LOW (ref 3.5–5.0)
Alkaline Phosphatase: 76 U/L (ref 38–126)
Bilirubin, Direct: <0.1 mg/dL (ref 0.0–0.2)
Total Bilirubin: 0.4 mg/dL (ref 0.3–1.2)
Total Protein: 5.9 g/dL — ABNORMAL LOW (ref 6.5–8.1)

## 2020-03-12 LAB — PROTEIN / CREATININE RATIO, URINE
Creatinine, Urine: 191.65 mg/dL
Protein Creatinine Ratio: 0.08 mg/mg{Cre} (ref 0.00–0.15)
Total Protein, Urine: 15 mg/dL

## 2020-03-12 LAB — TROPONIN I (HIGH SENSITIVITY): Troponin I (High Sensitivity): 10 ng/L (ref <18)

## 2020-03-12 LAB — BASIC METABOLIC PANEL
Anion gap: 7 (ref 5–15)
BUN: 9 mg/dL (ref 6–20)
CO2: 23 mmol/L (ref 22–32)
Calcium: 9.2 mg/dL (ref 8.9–10.3)
Chloride: 106 mmol/L (ref 98–111)
Creatinine, Ser: 0.75 mg/dL (ref 0.44–1.00)
GFR calc Af Amer: >60 mL/min (ref >60)
GFR calc non Af Amer: >60 mL/min (ref >60)
Glucose, Bld: 109 mg/dL — ABNORMAL HIGH (ref 70–99)
Potassium: 3.9 mmol/L (ref 3.5–5.1)
Sodium: 136 mmol/L (ref 135–145)

## 2020-03-12 LAB — I-STAT BETA HCG BLOOD, ED (MC, WL, AP ONLY): I-stat hCG, quantitative: >2000 m[IU]/mL — ABNORMAL HIGH (ref <5)

## 2020-03-12 MED ORDER — LABETALOL HCL 200 MG PO TABS: 400.0000 mg | ORAL_TABLET | Freq: Three times a day (TID) | ORAL | 2 refills | Status: DC

## 2020-03-12 MED ORDER — SODIUM CHLORIDE 0.9% FLUSH: 3.0000 mL | Freq: Once | INTRAVENOUS | Status: DC

## 2020-03-12 NOTE — Telephone Encounter (Signed)
Pt called and reports that her feet are "so swollen she cannot walk". After reviewing patient's chart I saw where she went to the ED this morning and left AMA and her BP was significantly elevated. I advised pt that the swelling in her feet is most likely related to her elevated BP and that she should go to MAU for evaluation of this. Pt voices understanding.

## 2020-03-12 NOTE — ED Notes (Signed)
pts family member states she is going to follow up with primary care d/t them both  Being sleepy and hungry. Advised patient to stay and told them their labwork should be on mychart if they did decide to follow up with primary care.

## 2020-03-12 NOTE — Discharge Instructions (Signed)

## 2020-03-12 NOTE — ED Triage Notes (Signed)
Pt reports Sob upon exertion and a cough that started "months ago."  She is 5 months pregnant.  Only other complaint is swollen ankles.  She is receiving Prenate care.

## 2020-03-12 NOTE — MAU Note (Signed)
Presents with c/o elevated BP & increased swelling.  Pt left AMA from [facility] ED this morning secondary "moving too slow and I was hungry".  States called MD office this am, chart reviewed by MD office and pt instructed to return to MAU for eval of BP's.  Denies VB.

## 2020-03-12 NOTE — MAU Provider Note (Signed)
History     CSN: [id number]  Arrival date and time: 03/12/20 1214   First Provider Initiated Contact with Patient 03/12/20 1332      Chief Complaint  Patient presents with   Swelling   High Blood Pressure   HPI  [name] is a 34 y.o. female G1P0 @ 24w1dwith a hx of morbid obesity and chronic HTN here with concerns about her BP. She is currently taking Labetalol 300 mg BID; Last dose was this morning. She she is also concerned about swelling in her legs when starting labetalol. She has noticed decreased mobility d/t pain in both legs 2/2 swelling.  [facility] for elevated blood pressure however not currently due to wait times she called her OB office this morning to discuss her elevated blood pressures and she was sent here for further evaluation.   OB History    Gravida  1   Para      Term      Preterm      AB      Living        SAB      TAB      Ectopic      Multiple      Live Births              Past Medical History:  Diagnosis Date   Bronchitis    Gonorrhea    Hypertension    Morbid [place]    Ob[place]     Past Surgical History:  Procedure Laterality Date   TONSILLECTOMY      Family History  Problem Relation Age of Onset   Hypertension Mother    Alzheimer's disease Maternal Aunt    Heart disease Maternal Grandfather    Diabetes Paternal Grandmother     Social History   Tobacco Use   Smoking status: Current Every Day Smoker    Packs/day: 0.25    Types: Cigarettes   Smokeless tobacco: Never Used  Substance Use Topics   Alcohol use: Not Currently    Alcohol/week: 0.0 standard drinks    Comment: sometimes     Drug use: No    Allergies:  Allergies  Allergen Reactions   Bactrim [Sulfamethoxazole-Trimethoprim] Anaphylaxis    Medications Prior to Admission  Medication Sig Dispense Refill Last Dose   aspirin EC 81 MG tablet Take 1 tablet (81 mg total) by mouth daily.  Take after 12 weeks for prevention of preeclampsia later in pregnancy 300 tablet 2 03/12/2020 at Unknown time   Blood Pressure Monitoring (BLOOD PRESSURE KIT) DEVI 1 kit by Does not apply route once a week. Check Blood Pressure regularly and record readings into the Babyscripts App. XLarge Cuff.  DX O90.0 1 each 0 03/12/2020 at Unknown time   loratadine (CLARITIN) 10 MG tablet Take 1 tablet (10 mg total) by mouth daily. 30 tablet 11 03/12/2020 at Unknown time   omeprazole (PRILOSEC) 20 MG capsule Take 1 capsule (20 mg total) by mouth daily. 30 capsule 5 03/12/2020 at Unknown time   Prenat-Fe Poly-Methfol-FA-DHA (VITAFOL ULTRA) 29-0.6-0.4-200 MG CAPS Take 1 capsule by mouth daily before breakfast. 90 capsule 4 03/12/2020 at Unknown time   [DISCONTINUED] labetalol (NORMODYNE) 300 MG tablet Take 1 tablet (300 mg total) by mouth 2 (two) times daily. 60 tablet 5 03/12/2020 at Unknown time   albuterol (VENTOLIN HFA) 108 (90 Base) MCG/ACT inhaler Inhale 2 puffs into the lungs every 6 (six) hours as needed  for wheezing or shortness of breath. 18 g 3    amLODipine (NORVASC) 10 MG tablet Take 1 tablet (10 mg total) by mouth daily. 30 tablet 3    hydrochlorothiazide (HYDRODIURIL) 25 MG tablet Take 1 tablet (25 mg total) by mouth daily. Take on tablet in the morning. 30 tablet 3    methyldopa-hydrochlorothiazide (ALDORIL) 250-15 MG tablet Take 1 tablet by mouth 2 (two) times daily. (Patient not taking: Reported on 01/08/2020) 60 tablet 1    polyethylene glycol (MIRALAX MIX-IN PAX) 17 g packet Take 17 g by mouth daily. 14 each 0 More than a month at Unknown time   Results for orders placed or performed during the hospital encounter of 03/12/20 (from the past 48 hour(s))  Protein / creatinine ratio, urine     Status: None   Collection Time: 03/12/20  1:52 PM  Result Value Ref Range   Creatinine, Urine 191.65 mg/dL   Total Protein, Urine 15 mg/dL    Comment: NO NORMAL RANGE ESTABLISHED FOR THIS TEST   Protein  Creatinine Ratio 0.08 0.00 - 0.15 mg/mg[Cre]    Comment: Performed at [facility] [address], [place], [place] [address]   Review of Systems  Eyes: Negative for photophobia and visual disturbance.  Musculoskeletal: Positive for joint swelling.  Neurological: Negative for headaches.   Physical Exam   Blood pressure (!) 157/105, pulse 83, temperature 98.9 F (37.2 C), temperature source Oral, resp. rate 20, height _0  (1.626 m), weight (!) 181.6 kg, last menstrual period 10/23/2019, SpO2 99 %.  Patient Vitals for the past 24 hrs:  BP Temp Temp src Pulse Resp SpO2 Height Weight  03/12/20 1400 (!) 157/105 -- -- 83 -- -- -- --  03/12/20 1345 (!) 145/91 -- -- 92 -- -- -- --  03/12/20 1330 (!) 144/89 -- -- 85 -- -- -- --  03/12/20 1315 (!) 156/93 -- -- 94 -- -- -- --  03/12/20 1238 (!) 146/88 98.9 F (37.2 C) Oral 95 20 99 % _1  (1.626 m) (!) 181.6 kg   Physical Exam  Constitutional: She is oriented to person, place, and time. She appears well-developed and well-nourished.  Cardiovascular: Normal rate.  Respiratory: Effort normal.  Musculoskeletal:        General: Tenderness and edema present.     Right ankle: Swelling present. Decreased range of motion.     Left ankle: Swelling present. Decreased range of motion.     Comments: 3+ pitting edema in bilateral lower extremities.   Neurological: She is alert and oriented to person, place, and time.  Skin: Skin is warm.  Psychiatric: Her behavior is normal.   MAU Course  Procedures  None   MDM  + fetal heart tones via doppler Baseline PCR collected.  Chronic HTN on labetalol, needs adjustment on medications. Discussed patient with Dr. Elly Modena: increase labetalol to 400 mg TID With BP check (virtual ok) with [facility] next week. Message sent to schedule  Assessment and Plan   A:  1. Bilateral lower extremity edema   2. Chronic hypertension affecting pregnancy   3. Morbid obesity ([place])   4. [redacted] weeks gestation of  pregnancy   5. HTN in pregnancy, chronic     P:  Discharge home in stable condition RX: Labetalol 400 TID BP check next week in [place]  Ambulatory referral to PT, patient with morbid obesity with limited mobility d/t swelling and pregnancy weight gain.  Highly recommend ted hose daily. Alternate elevation, and mobility.  Lezlie Lye, NP 03/12/2020 2:47 PM

## 2020-03-13 ENCOUNTER — Encounter (HOSPITAL_BASED_OUTPATIENT_CLINIC_OR_DEPARTMENT_OTHER): Payer: Medicaid Other | Admitting: Internal Medicine

## 2020-03-13 LAB — URINE CULTURE

## 2020-03-21 ENCOUNTER — Encounter (HOSPITAL_BASED_OUTPATIENT_CLINIC_OR_DEPARTMENT_OTHER): Payer: Medicaid Other | Admitting: Internal Medicine

## 2020-03-25 ENCOUNTER — Telehealth (INDEPENDENT_AMBULATORY_CARE_PROVIDER_SITE_OTHER): Payer: Medicaid Other | Admitting: Obstetrics and Gynecology

## 2020-03-25 ENCOUNTER — Encounter: Payer: Medicaid Other | Attending: Family Medicine | Admitting: Registered"

## 2020-03-25 ENCOUNTER — Other Ambulatory Visit: Payer: Self-pay

## 2020-03-25 ENCOUNTER — Ambulatory Visit: Payer: Medicaid Other | Admitting: Registered"

## 2020-03-25 VITALS — BP 141/90 | HR 99

## 2020-03-25 DIAGNOSIS — Z3A00 Weeks of gestation of pregnancy not specified: Secondary | ICD-10-CM | POA: Diagnosis not present

## 2020-03-25 DIAGNOSIS — O26899 Other specified pregnancy related conditions, unspecified trimester: Secondary | ICD-10-CM

## 2020-03-25 DIAGNOSIS — O0990 Supervision of high risk pregnancy, unspecified, unspecified trimester: Secondary | ICD-10-CM

## 2020-03-25 DIAGNOSIS — O10912 Unspecified pre-existing hypertension complicating pregnancy, second trimester: Secondary | ICD-10-CM

## 2020-03-25 DIAGNOSIS — Z713 Dietary counseling and surveillance: Secondary | ICD-10-CM | POA: Insufficient documentation

## 2020-03-25 DIAGNOSIS — E669 Obesity, unspecified: Secondary | ICD-10-CM | POA: Diagnosis not present

## 2020-03-25 DIAGNOSIS — O99210 Obesity complicating pregnancy, unspecified trimester: Secondary | ICD-10-CM | POA: Diagnosis not present

## 2020-03-25 DIAGNOSIS — O0992 Supervision of high risk pregnancy, unspecified, second trimester: Secondary | ICD-10-CM

## 2020-03-25 DIAGNOSIS — O99212 Obesity complicating pregnancy, second trimester: Secondary | ICD-10-CM

## 2020-03-25 DIAGNOSIS — O10919 Unspecified pre-existing hypertension complicating pregnancy, unspecified trimester: Secondary | ICD-10-CM

## 2020-03-25 DIAGNOSIS — Z3A22 22 weeks gestation of pregnancy: Secondary | ICD-10-CM

## 2020-03-25 DIAGNOSIS — O26892 Other specified pregnancy related conditions, second trimester: Secondary | ICD-10-CM

## 2020-03-25 DIAGNOSIS — G5600 Carpal tunnel syndrome, unspecified upper limb: Secondary | ICD-10-CM

## 2020-03-25 MED ORDER — ELASTIC BANDAGES & SUPPORTS MISC: 1.0000 | 0 refills | Status: AC

## 2020-03-25 NOTE — Progress Notes (Signed)
Medical Nutrition Therapy:  Appt start time: 1515 end time:  1615.  Assessment:  Primary concerns today: unexplained weight gain early pregnancy.  Relevant labs:  elevated blood sugar in pregnancy: 01/08/20 A1c 5.8%; 02/05/20 GTT elevated fasting 94 mg/dL;  History of elevated testosterone (PCOS?) 12/16/2014 108 ng/dL (54-65 ng/dL); 6/81/2751 700.17 ng/dL   Other evidence for PCOS: Fertility: Pt states current pregnancy wasn't planned and wasn't expected because she had been trying to conceive unsuccessfully for many years. Hirsutism: Pt reports female pattern hair growth starting ~2002. Irregular menstrual cycles: Pt reports history of irregular menstrual cycles followed by return to monthly periods. Pt states in 2007 during one menstruation she continued to bleed for extended period of time and due to amount of blood lost needed a blood transfusion. Pt states several different OCPs were prescribed unsuccessfully treating bleeding but eventually periods returned to monthly cycle.   Pt reports starch preferences such as rice, pasta and potatoes.   Pt was experiencing symptoms related to allergies that appeared to make it difficult breathing toward end of visit.  Preferred Learning Style:  No preference indicated   Learning Readiness:   Ready  MEDICATIONS: reviewed   DIETARY INTAKE:  Usual eating pattern includes 3 meals and 2-3 snacks per day.  24-hr recall:  B ( AM): bacon, egg, applesauce  Snk ( AM): fruit OR chips L ( PM): sandwich, [place], cheese, mustard, pickle chips Snk ( PM): same D ( PM): spaghetti, meat sauce, salad, garlic bread Snk ( PM):  Beverages: water, apple juice (other beverages?)  Usual physical activity: not assessed  Estimated energy needs:not assessed  Progress Towards Goal(s):  New goals   Nutritional Diagnosis:  NI-5.8.5 Inadeqate fiber intake As related to limited whole grains, beans and vegetables.  As evidenced by dietary recall.    Intervention:   Nutrition Education topics * PCOS, insulin resistance, and foods affecting blood sugar * Physiology of pregnancy and insulin needs change over time * Purpose of 28 week OGTT * balanced eating during pregnancy  Teaching Method Utilized:  Visual Auditory Hands on  Handouts given during visit include:  MyPlate for Moms  PCOS and Inositol  Ovasitol brochure & supplement sample Lot [id number]; exp 12/21  Barriers to learning/adherence to lifestyle change: none  Demonstrated degree of understanding via:  Teach Back   Monitoring/Evaluation:  Dietary intake, exercise, and body weight prn.

## 2020-03-25 NOTE — Progress Notes (Signed)
I connected with  Laura Gutierrez on 03/25/20 by a video enabled telemedicine application and verified that I am speaking with the correct person using two identifiers.   I discussed the limitations of evaluation and management by telemedicine. The patient expressed understanding and agreed to proceed.   MyChart OB, c/o pressure, pain, corporal tunnel numbness in fingers on right hand, annoying cough x 2 months.  Wants RX for [facility] and wrist brace.

## 2020-03-25 NOTE — Progress Notes (Signed)
OBSTETRICS PRENATAL VIRTUAL VISIT ENCOUNTER NOTE  Provider location: [facility] [place]   I connected with Leafy Half on 03/25/20 at 10:15 AM EDT by MyChart Video Encounter at home and verified that I am speaking with the correct person using two identifiers.   I discussed the limitations, risks, security and privacy concerns of performing an evaluation and management service virtually and the availability of in person appointments. I also discussed with the patient that there may be a patient responsible charge related to this service. The patient expressed understanding and agreed to proceed. Subjective:  Laura Gutierrez is a 34 y.o. G1P0 at [redacted]w[redacted]d being seen today for ongoing prenatal care.  She is currently monitored for the following issues for this high-risk pregnancy and has Hypertension; Obesity with serious comorbidity; Supervision of high risk pregnancy, antepartum; and Chronic pre-existing hypertension during pregnancy on their problem list.   Pt notes compliance with baby aspirin and labetalol.  She missed her sleep specialist appointment and has not rescheduled yet.  Emphasized how important controlling her sleep apnea could be in light of her chronic hypertension.  Patient reports no complaints.  Contractions: Not present. Vag. Bleeding: None.  Movement: Present. Denies any leaking of fluid.   The following portions of the patient's history were reviewed and updated as appropriate: allergies, current medications, past family history, past medical history, past social history, past surgical history and problem list.   Objective:   Vitals:   03/25/20 0844  BP: (!) 141/90  Pulse: 99    Fetal Status:     Movement: Present     General:  Alert, oriented and cooperative. Patient is in no acute distress.  Respiratory: Normal respiratory effort, no problems with respiration noted  Mental Status: Normal mood and affect. Normal behavior. Normal judgment and  thought content.  Rest of physical exam deferred due to type of encounter  Imaging: [place] MFM OB DETAIL +14 WK  Result Date: 03/04/2020 ----------------------------------------------------------------------  OBSTETRICS REPORT                       (Signed Final 03/04/2020 11:37 am) ---------------------------------------------------------------------- Patient Info  ID #:       [id number]                          D.O.B.:  10-Dec-1985 (34 yrs)  Name:       Laura Gutierrez                   Visit Date: 03/04/2020 11:11 am ---------------------------------------------------------------------- Performed By  Attending:        Johnell Comings MD         Ref. Address:     [place],  [place]                                                             [address]  Performed By:     Lenise Arena        [facility]  Referred By:      Brock Bad MD ---------------------------------------------------------------------- Orders  #  Description                           Code        Ordered By  1  [place] MFM OB DETAIL +14 WK               76811.01    Coral Ceo ----------------------------------------------------------------------  #  Order #                     Accession #                Episode #  1  [id number]                   [id number]                 [id number] ---------------------------------------------------------------------- Indications  Hypertension - Chronic/Pre-existing            O10.019  (Labetalol, ASA)  Maternal morbid obesity (BMI 61)               O99.210 E66.01  Encounter for antenatal screening for          Z36.3  malformations (low risk NIPS, neg AFP, neg  Hor[facility])  Tobacco use complicating pregnancy,            O99.332  second trimester  [redacted] weeks gestation of  pregnancy                Z3A.19 ---------------------------------------------------------------------- Vital Signs  Weight (lb): 379                               Height:        5'6"  BMI:         61.17 ---------------------------------------------------------------------- Fetal Evaluation  Num Of Fetuses:         1  Fetal Heart Rate(bpm):  164  Cardiac Activity:       Observed  Presentation:           Transverse, head to maternal left  Placenta:               Anterior  P. Cord Insertion:      Visualized, central  Amniotic Fluid  AFI FV:      Within normal limits ---------------------------------------------------------------------- Biometry  BPD:  43.8  mm     G. Age:  19w 2d         62  %    CI:        78.74   %    70 - 86                                                          FL/HC:      18.8   %    16.1 - 18.3  HC:      156.1  mm     G. Age:  18w 4d         21  %    HC/AC:      1.00        1.09 - 1.39  AC:      156.2  mm     G. Age:  20w 5d         93  %    FL/BPD:     66.9   %  FL:       29.3  mm     G. Age:  19w 0d         44  %    FL/AC:      18.8   %    20 - 24  CER:      20.2  mm     G. Age:  19w 1d         55  %  NFT:       2.7  mm  LV:        5.4  mm  CM:        5.1  mm  Est. FW:     314  gm    0 lb 11 oz      88  % ---------------------------------------------------------------------- OB History  Gravidity:    1 ---------------------------------------------------------------------- Gestational Age  LMP:           19w 0d        Date:  10/23/19                 EDD:   07/29/20  U/S Today:     19w 3d                                        EDD:   07/26/20  Best:          19w 0d     Det. By:  LMP  (10/23/19)          EDD:   07/29/20 ---------------------------------------------------------------------- Anatomy  Cranium:               Appears normal         Aortic Arch:            Not well visualized  Cavum:                 Appears normal         Ductal Arch:            Not well visualized  Ventricles:             Appears normal  Diaphragm:              Not well visualized  Choroid Plexus:        Appears normal         Stomach:                Appears normal, left                                                                        sided  Cerebellum:            Appears normal         Abdomen:                Appears normal  Posterior Fossa:       Appears normal         Abdominal Wall:         Appears nml (cord                                                                        insert, abd wall)  Nuchal Fold:           Appears normal         Cord Vessels:           Appears normal (3                                                                        vessel cord)  Face:                  Appears normal         Kidneys:                Appear normal                         (orbits and profile)  Lips:                  Appears normal         Bladder:                Appears normal  Thoracic:              Appears normal         Spine:                  Not well visualized  Heart:                 Not well visualized    Upper Extremities:      Appears normal  RVOT:  Not well visualized    Lower Extremities:      Appears normal  LVOT:                  Not well visualized  Other:  Technically difficult due to maternal habitus and fetal position. ---------------------------------------------------------------------- Cervix Uterus Adnexa  Cervix  Length:           3.93  cm.  Normal appearance by transabdominal scan. ---------------------------------------------------------------------- Comments  This patient was seen for a detailed fetal anatomy scan due  to maternal obesity and history of chronic hypertension  currently treated with labetalol 300 mg twice a day.  She is  taking a daily baby aspirin for preeclampsia prophylaxis.  Her  blood pressures in our office today were 149/92 and 156/97.  She denies any other significant past medical history and  denies any problems in her current pregnancy.  She  had a cell free DNA test earlier in her pregnancy which  indicated a low risk for trisomy 48, 56, and 13.  The patient  did not want the fetal gender revealed today.  She was informed that the fetal growth and amniotic fluid  level were appropriate for her gestational age.  The views of the fetal anatomy were limited today due to  extreme maternal body habitus.  The patient was informed that anomalies may be missed due  to technical limitations. If the fetus is in a suboptimal position  or maternal habitus is increased, visualization of the fetus in  the maternal uterus may be impaired.  The implications and management of chronic hypertension in  pregnancy was discussed. The patient was advised that  should her blood pressures continue to be elevated, the  dosage of her antihypertensive medications may need to be  increased.  The increased risk of superimposed  preeclampsia, an indicated preterm delivery, and possible  fetal growth restriction due to chronic hypertension in  pregnancy was discussed. The patient was advised that we  will continue to follow her closely throughout her pregnancy.  We will continue to follow her with monthly growth scans.  Weekly fetal testing should be started at around 32 weeks.  The signs and symptoms of preeclampsia were reviewed with  the patient today.  She will start monitoring her blood  pressures at home.  She was advised to call should her blood  pressures be persistently greater than 140s to 150s over 90s.  To decrease her risk of superimposed preeclampsia, she  should continue taking a daily baby aspirin (81 mg daily) for  preeclampsia prophylaxis.  A follow-up exam was scheduled in 4 weeks to complete the  views of the fetal anatomy and to assess the fetal growth. ----------------------------------------------------------------------                   Ma Rings, MD Electronically Signed Final Report   03/04/2020 11:37 am  ----------------------------------------------------------------------   Assessment and Plan:  Pregnancy: G1P0 at [redacted]w[redacted]d 1. Supervision of high risk pregnancy, antepartum   2. Chronic pre-existing hypertension during pregnancy Continue labetalol 400 mg TID, BP hovers around 140/90  3. Class 3 severe obesity due to excess calories with serious comorbidity and body mass index (BMI) of 60.0 to 69.9 in adult [facility])    Pt is also inquiring about a belly band and wrist splint for carpal tunnel syndrome.  Preterm labor symptoms and general obstetric precautions including but not limited to vaginal bleeding, contractions, leaking of fluid and fetal movement were reviewed in detail  with the patient. I discussed the assessment and treatment plan with the patient. The patient was provided an opportunity to ask questions and all were answered. The patient agreed with the plan and demonstrated an understanding of the instructions. The patient was advised to call back or seek an in-person office evaluation/go to MAU at [facility] for any urgent or concerning symptoms. Please refer to After Visit Summary for other counseling recommendations.   I provided 20 minutes of face-to-face time during this encounter.  Return in about 2 weeks (around 04/08/2020) for [facility].  Future Appointments  Date Time Provider Department Center  03/25/2020  3:15 PM [facility]  04/01/2020 11:15 AM [facility] NURSE [facility]  04/01/2020 11:15 AM [facility] US2 [facility]    Warden Fillers, MD [facility] [employer], [facility]

## 2020-03-28 ENCOUNTER — Other Ambulatory Visit: Payer: Self-pay

## 2020-03-28 ENCOUNTER — Other Ambulatory Visit: Payer: Self-pay | Admitting: Obstetrics

## 2020-03-28 DIAGNOSIS — O10919 Unspecified pre-existing hypertension complicating pregnancy, unspecified trimester: Secondary | ICD-10-CM

## 2020-03-28 MED ORDER — AMBULATORY NON FORMULARY MEDICATION
0 refills | Status: DC
Start: 2020-03-28 — End: 2020-05-06

## 2020-04-01 ENCOUNTER — Ambulatory Visit: Payer: Medicaid Other | Admitting: *Deleted

## 2020-04-01 ENCOUNTER — Other Ambulatory Visit: Payer: Self-pay

## 2020-04-01 ENCOUNTER — Ambulatory Visit: Payer: Medicaid Other | Attending: Obstetrics and Gynecology

## 2020-04-01 ENCOUNTER — Other Ambulatory Visit: Payer: Self-pay | Admitting: *Deleted

## 2020-04-01 VITALS — BP 134/80 | HR 93

## 2020-04-01 DIAGNOSIS — I10 Essential (primary) hypertension: Secondary | ICD-10-CM

## 2020-04-01 DIAGNOSIS — O10912 Unspecified pre-existing hypertension complicating pregnancy, second trimester: Secondary | ICD-10-CM

## 2020-04-01 DIAGNOSIS — Z362 Encounter for other antenatal screening follow-up: Secondary | ICD-10-CM | POA: Diagnosis not present

## 2020-04-01 DIAGNOSIS — O0990 Supervision of high risk pregnancy, unspecified, unspecified trimester: Secondary | ICD-10-CM | POA: Diagnosis present

## 2020-04-01 DIAGNOSIS — Z3A23 23 weeks gestation of pregnancy: Secondary | ICD-10-CM

## 2020-04-01 DIAGNOSIS — O99212 Obesity complicating pregnancy, second trimester: Secondary | ICD-10-CM

## 2020-04-01 DIAGNOSIS — O10012 Pre-existing essential hypertension complicating pregnancy, second trimester: Secondary | ICD-10-CM

## 2020-04-01 DIAGNOSIS — O10919 Unspecified pre-existing hypertension complicating pregnancy, unspecified trimester: Secondary | ICD-10-CM

## 2020-04-01 DIAGNOSIS — Z363 Encounter for antenatal screening for malformations: Secondary | ICD-10-CM

## 2020-04-01 DIAGNOSIS — O99332 Smoking (tobacco) complicating pregnancy, second trimester: Secondary | ICD-10-CM | POA: Diagnosis not present

## 2020-04-01 DIAGNOSIS — O321XX0 Maternal care for breech presentation, not applicable or unspecified: Secondary | ICD-10-CM | POA: Diagnosis not present

## 2020-04-02 ENCOUNTER — Other Ambulatory Visit: Payer: Self-pay

## 2020-04-08 ENCOUNTER — Other Ambulatory Visit: Payer: Self-pay

## 2020-04-08 ENCOUNTER — Encounter: Payer: Self-pay | Admitting: Obstetrics and Gynecology

## 2020-04-08 ENCOUNTER — Ambulatory Visit (INDEPENDENT_AMBULATORY_CARE_PROVIDER_SITE_OTHER): Payer: Medicaid Other | Admitting: Obstetrics and Gynecology

## 2020-04-08 VITALS — BP 136/90 | HR 102 | Wt >= 6400 oz

## 2020-04-08 DIAGNOSIS — O99212 Obesity complicating pregnancy, second trimester: Secondary | ICD-10-CM

## 2020-04-08 DIAGNOSIS — O9952 Diseases of the respiratory system complicating childbirth: Secondary | ICD-10-CM

## 2020-04-08 DIAGNOSIS — O0993 Supervision of high risk pregnancy, unspecified, third trimester: Secondary | ICD-10-CM

## 2020-04-08 DIAGNOSIS — E669 Obesity, unspecified: Secondary | ICD-10-CM

## 2020-04-08 DIAGNOSIS — J302 Other seasonal allergic rhinitis: Secondary | ICD-10-CM

## 2020-04-08 DIAGNOSIS — O0990 Supervision of high risk pregnancy, unspecified, unspecified trimester: Secondary | ICD-10-CM

## 2020-04-08 DIAGNOSIS — O10913 Unspecified pre-existing hypertension complicating pregnancy, third trimester: Secondary | ICD-10-CM

## 2020-04-08 DIAGNOSIS — O10919 Unspecified pre-existing hypertension complicating pregnancy, unspecified trimester: Secondary | ICD-10-CM

## 2020-04-08 DIAGNOSIS — Z3A24 24 weeks gestation of pregnancy: Secondary | ICD-10-CM

## 2020-04-08 NOTE — Progress Notes (Signed)
Pt presents for ROB has several questions for the MD.

## 2020-04-08 NOTE — Progress Notes (Signed)
   PRENATAL VISIT NOTE  Subjective:  Laura Gutierrez is a 34 y.o. G1P0 at [redacted]w[redacted]d being seen today for ongoing prenatal care.  She is currently monitored for the following issues for this high-risk pregnancy and has Hypertension; Obesity with serious comorbidity; Supervision of high risk pregnancy, antepartum; Chronic pre-existing hypertension during pregnancy; and Obesity affecting pregnancy, antepartum on their problem list.  Patient reports postnasal drainage in the am..  Contractions: Not present.  .  Movement: Present. Denies leaking of fluid.   The following portions of the patient's history were reviewed and updated as appropriate: allergies, current medications, past family history, past medical history, past social history, past surgical history and problem list.   Objective:   Vitals:   04/08/20 1121  BP: 136/90  Pulse: (!) 102  Weight: (!) 190.1 kg    Fetal Status: Fetal Heart Rate (bpm): 140 Fundal Height: 35 cm Movement: Present  Presentation: Vertex  General:  Alert, oriented and cooperative. Patient is in no acute distress.  Skin: Skin is warm and dry. No rash noted.   Cardiovascular: Normal heart rate noted  Respiratory: Normal respiratory effort, no problems with respiration noted  Abdomen: Soft, gravid, appropriate for gestational age.  Pain/Pressure: Absent     Pelvic: Cervical exam deferred        Extremities: Normal range of motion.  Edema: None  Mental Status: Normal mood and affect. Normal behavior. Normal judgment and thought content.   Assessment and Plan:  Pregnancy: G1P0 at [redacted]w[redacted]d 1. Supervision of high risk pregnancy, antepartum   2. Chronic pre-existing hypertension during pregnancy Patient currently taking labetalol 200mg  tid.  Will repeat BP next week.  If the diastolic BP is still in the 90s we will increased the dosage to 300mg  tid.    3. Seasonal allergies Patient to start Zyrtec  Preterm labor symptoms and general obstetric precautions including  but not limited to vaginal bleeding, contractions, leaking of fluid and fetal movement were reviewed in detail with the patient. Please refer to After Visit Summary for other counseling recommendations.   Return in about 1 week (around 04/15/2020) for [facility] for BP check.  Future Appointments  Date Time Provider Department Center  04/15/2020 10:20 AM [facility] NURSE [facility] None  04/29/2020  1:00 PM [facility] NURSE [facility]  04/29/2020  1:15 PM [facility] US2 [facility]    [facility] MD

## 2020-04-13 ENCOUNTER — Other Ambulatory Visit: Payer: Self-pay | Admitting: Obstetrics

## 2020-04-13 DIAGNOSIS — O10919 Unspecified pre-existing hypertension complicating pregnancy, unspecified trimester: Secondary | ICD-10-CM

## 2020-04-15 ENCOUNTER — Ambulatory Visit: Payer: Medicaid Other

## 2020-04-15 DIAGNOSIS — I10 Essential (primary) hypertension: Secondary | ICD-10-CM

## 2020-04-15 NOTE — Progress Notes (Signed)
Pt did not answer nurse phone call, no option to leave VM.

## 2020-04-16 ENCOUNTER — Other Ambulatory Visit: Payer: Self-pay

## 2020-04-16 ENCOUNTER — Ambulatory Visit (HOSPITAL_BASED_OUTPATIENT_CLINIC_OR_DEPARTMENT_OTHER): Payer: Private Health Insurance - Indemnity

## 2020-04-16 DIAGNOSIS — O0990 Supervision of high risk pregnancy, unspecified, unspecified trimester: Secondary | ICD-10-CM

## 2020-04-16 DIAGNOSIS — O10919 Unspecified pre-existing hypertension complicating pregnancy, unspecified trimester: Secondary | ICD-10-CM | POA: Diagnosis not present

## 2020-04-16 DIAGNOSIS — Z3A00 Weeks of gestation of pregnancy not specified: Secondary | ICD-10-CM

## 2020-04-16 NOTE — Progress Notes (Signed)
Subjective:  Laura Gutierrez is a 34 y.o. female here for BP check.   Hypertension ROS: taking medications as instructed, no medication side effects noted, no TIA's, no chest pain on exertion, no dyspnea on exertion and no swelling of ankles. Pt does report trouble breathing comfortably due to sinus congestion. I advised pt on safe medication to take including: Sudafed, Mucinex, and Afrin to help with congestion. Also advised pt that if she feels SOB to not hesitate to go to the hospital or call 911. Pt voices understanding.    Objective:  BP 136/85   Pulse (!) 109   Wt (!) 418 lb 9.6 oz (189.9 kg)   LMP 10/23/2019 (Exact Date)   BMI 71.85 kg/m  SpO2: 98% Appearance alert, well appearing, and in no distress and overweight. General exam BP noted to be well controlled today in office.    Assessment:   Blood Pressure reasonably well controlled.   Plan:  Current treatment plan is effective, no change in therapy.Marland Kitchen

## 2020-04-21 NOTE — Progress Notes (Signed)
Patient was assessed and managed by nursing staff during this encounter. I have reviewed the chart and agree with the documentation and plan. I have also made any necessary editorial changes.  Catalina Antigua, MD 04/21/2020 9:12 AM

## 2020-04-22 ENCOUNTER — Encounter: Payer: Self-pay | Admitting: Obstetrics and Gynecology

## 2020-04-22 ENCOUNTER — Telehealth (INDEPENDENT_AMBULATORY_CARE_PROVIDER_SITE_OTHER): Payer: Medicaid Other | Admitting: Obstetrics and Gynecology

## 2020-04-22 VITALS — BP 127/75 | HR 96

## 2020-04-22 DIAGNOSIS — O99212 Obesity complicating pregnancy, second trimester: Secondary | ICD-10-CM

## 2020-04-22 DIAGNOSIS — E669 Obesity, unspecified: Secondary | ICD-10-CM

## 2020-04-22 DIAGNOSIS — O10912 Unspecified pre-existing hypertension complicating pregnancy, second trimester: Secondary | ICD-10-CM

## 2020-04-22 DIAGNOSIS — O10919 Unspecified pre-existing hypertension complicating pregnancy, unspecified trimester: Secondary | ICD-10-CM

## 2020-04-22 DIAGNOSIS — Z3A26 26 weeks gestation of pregnancy: Secondary | ICD-10-CM

## 2020-04-22 DIAGNOSIS — O0992 Supervision of high risk pregnancy, unspecified, second trimester: Secondary | ICD-10-CM

## 2020-04-22 DIAGNOSIS — O0990 Supervision of high risk pregnancy, unspecified, unspecified trimester: Secondary | ICD-10-CM

## 2020-04-22 NOTE — Progress Notes (Signed)
S/w pt for virtual visit. Pt reports fetal movement, denies pain. 

## 2020-04-22 NOTE — Progress Notes (Signed)
   PRENATAL VISIT NOTE  Subjective:  Laura Gutierrez is a 34 y.o. G1P0 at [redacted]w[redacted]d being seen today for ongoing prenatal care.  She is currently monitored for the following issues for this high-risk pregnancy and has Hypertension; Obesity with serious comorbidity; Supervision of high risk pregnancy, antepartum; Chronic pre-existing hypertension during pregnancy; and Obesity affecting pregnancy, antepartum on their problem list.  Patient reports no complaints.  Contractions: Not present. Vag. Bleeding: None.  Movement: Present. Denies leaking of fluid.   The following portions of the patient's history were reviewed and updated as appropriate: allergies, current medications, past family history, past medical history, past social history, past surgical history and problem list.   Objective:   Vitals:   04/22/20 1538  BP: 127/75  Pulse: 96    Fetal Status:     Movement: Present     General:  Alert, oriented and cooperative. Patient is in no acute distress.                    Mental Status: Normal mood and affect. Normal behavior. Normal judgment and thought content.   Assessment and Plan:  Pregnancy: G1P0 at [redacted]w[redacted]d 1. Supervision of high risk pregnancy, antepartum - Patient for 2hrGTT at next appointment  2. Chronic pre-existing hypertension during pregnancy - Patient to continue Labetalol at 200 mg tid.    Preterm labor symptoms and general obstetric precautions including but not limited to vaginal bleeding, contractions, leaking of fluid and fetal movement were reviewed in detail with the patient. Please refer to After Visit Summary for other counseling recommendations.   Return in about 2 weeks (around 05/06/2020) for [facility] with 2hr Gtt.  Future Appointments  Date Time Provider Department Center  04/29/2020  1:00 PM [facility] NURSE [facility]  04/29/2020  1:15 PM [facility] US2 [facility]    Johnny Bridge, MD

## 2020-04-29 ENCOUNTER — Other Ambulatory Visit: Payer: Self-pay | Admitting: *Deleted

## 2020-04-29 ENCOUNTER — Other Ambulatory Visit: Payer: Self-pay

## 2020-04-29 ENCOUNTER — Encounter: Payer: Self-pay | Admitting: *Deleted

## 2020-04-29 ENCOUNTER — Ambulatory Visit: Payer: Private Health Insurance - Indemnity | Admitting: *Deleted

## 2020-04-29 ENCOUNTER — Ambulatory Visit: Payer: Private Health Insurance - Indemnity | Attending: Obstetrics and Gynecology

## 2020-04-29 DIAGNOSIS — F17200 Nicotine dependence, unspecified, uncomplicated: Secondary | ICD-10-CM

## 2020-04-29 DIAGNOSIS — O10012 Pre-existing essential hypertension complicating pregnancy, second trimester: Secondary | ICD-10-CM

## 2020-04-29 DIAGNOSIS — Z363 Encounter for antenatal screening for malformations: Secondary | ICD-10-CM

## 2020-04-29 DIAGNOSIS — O10919 Unspecified pre-existing hypertension complicating pregnancy, unspecified trimester: Secondary | ICD-10-CM

## 2020-04-29 DIAGNOSIS — Z362 Encounter for other antenatal screening follow-up: Secondary | ICD-10-CM

## 2020-04-29 DIAGNOSIS — O99332 Smoking (tobacco) complicating pregnancy, second trimester: Secondary | ICD-10-CM

## 2020-04-29 DIAGNOSIS — O0990 Supervision of high risk pregnancy, unspecified, unspecified trimester: Secondary | ICD-10-CM | POA: Diagnosis present

## 2020-04-29 DIAGNOSIS — O99212 Obesity complicating pregnancy, second trimester: Secondary | ICD-10-CM

## 2020-04-29 DIAGNOSIS — Z3A27 27 weeks gestation of pregnancy: Secondary | ICD-10-CM

## 2020-04-30 ENCOUNTER — Other Ambulatory Visit: Payer: Self-pay | Admitting: *Deleted

## 2020-04-30 DIAGNOSIS — O10919 Unspecified pre-existing hypertension complicating pregnancy, unspecified trimester: Secondary | ICD-10-CM

## 2020-05-06 ENCOUNTER — Other Ambulatory Visit: Payer: Self-pay

## 2020-05-06 ENCOUNTER — Other Ambulatory Visit: Payer: Medicaid Other

## 2020-05-06 ENCOUNTER — Ambulatory Visit (INDEPENDENT_AMBULATORY_CARE_PROVIDER_SITE_OTHER): Payer: Medicaid Other | Admitting: Obstetrics and Gynecology

## 2020-05-06 ENCOUNTER — Encounter: Payer: Self-pay | Admitting: Obstetrics and Gynecology

## 2020-05-06 ENCOUNTER — Ambulatory Visit (INDEPENDENT_AMBULATORY_CARE_PROVIDER_SITE_OTHER): Payer: Medicaid Other

## 2020-05-06 VITALS — BP 149/87 | HR 98 | Wt >= 6400 oz

## 2020-05-06 DIAGNOSIS — O99210 Obesity complicating pregnancy, unspecified trimester: Secondary | ICD-10-CM

## 2020-05-06 DIAGNOSIS — O368390 Maternal care for abnormalities of the fetal heart rate or rhythm, unspecified trimester, not applicable or unspecified: Secondary | ICD-10-CM

## 2020-05-06 DIAGNOSIS — O10919 Unspecified pre-existing hypertension complicating pregnancy, unspecified trimester: Secondary | ICD-10-CM

## 2020-05-06 DIAGNOSIS — O0990 Supervision of high risk pregnancy, unspecified, unspecified trimester: Secondary | ICD-10-CM

## 2020-05-06 NOTE — Addendum Note (Signed)
Addended by: Marya Landry D on: 05/06/2020 11:26 AM   Modules accepted: Orders

## 2020-05-06 NOTE — Progress Notes (Signed)
Subjective:  Laura Gutierrez is a 34 y.o. G1P0 at [redacted]w[redacted]d being seen today for ongoing prenatal care.  She is currently monitored for the following issues for this high-risk pregnancy and has Hypertension; Obesity with serious comorbidity; Supervision of high risk pregnancy, antepartum; Chronic pre-existing hypertension during pregnancy; and Obesity affecting pregnancy, antepartum on their problem list.  Patient reports no complaints.  Contractions: Not present. Vag. Bleeding: None.  Movement: Present. Denies leaking of fluid.   The following portions of the patient's history were reviewed and updated as appropriate: allergies, current medications, past family history, past medical history, past social history, past surgical history and problem list. Problem list updated.  Objective:   Vitals:   05/06/20 1031  BP: (!) 149/87  Pulse: 98  Weight: (!) 418 lb 12.8 oz (190 kg)    Fetal Status: Fetal Heart Rate (bpm): 157 u/s   Movement: Present     General:  Alert, oriented and cooperative. Patient is in no acute distress.  Skin: Skin is warm and dry. No rash noted.   Cardiovascular: Normal heart rate noted  Respiratory: Normal respiratory effort, no problems with respiration noted  Abdomen: Soft, gravid, appropriate for gestational age. Pain/Pressure: Absent     Pelvic:  Cervical exam deferred        Extremities: Normal range of motion.     Mental Status: Normal mood and affect. Normal behavior. Normal judgment and thought content.   Urinalysis:      Assessment and Plan:  Pregnancy: G1P0 at [redacted]w[redacted]d  1. Ultrasound scan done for inability to hear fetal heart tones  - US OB Limited; Future  2. Supervision of high risk pregnancy, antepartum Stable U/S today for FHT's in office Glucola today   3. Chronic pre-existing hypertension during pregnancy BP stable, no S/Sx of PEC. Continue with BASA and Labetalol  Growth scan ordered  4. Obesity affecting pregnancy, antepartum Pt doing well  with wt gain  Preterm labor symptoms and general obstetric precautions including but not limited to vaginal bleeding, contractions, leaking of fluid and fetal movement were reviewed in detail with the patient. Please refer to After Visit Summary for other counseling recommendations.  Return in about 2 weeks (around 05/20/2020) for OB visit, face to face, MD only.   Hermina Staggers, MD

## 2020-05-06 NOTE — Patient Instructions (Signed)
Third Trimester of Pregnancy The third trimester is from week 28 through week 40 (months 7 through 9). The third trimester is a time when the unborn baby (fetus) is growing rapidly. At the end of the ninth month, the fetus is about 20 inches in length and weighs 6-10 pounds. Body changes during your third trimester Your body will continue to go through many changes during pregnancy. The changes vary from woman to woman. During the third trimester:  Your weight will continue to increase. You can expect to gain 25-35 pounds (11-16 kg) by the end of the pregnancy.  You may begin to get stretch marks on your hips, abdomen, and breasts.  You may urinate more often because the fetus is moving lower into your pelvis and pressing on your bladder.  You may develop or continue to have heartburn. This is caused by increased hormones that slow down muscles in the digestive tract.  You may develop or continue to have constipation because increased hormones slow digestion and cause the muscles that push waste through your intestines to relax.  You may develop hemorrhoids. These are swollen veins (varicose veins) in the rectum that can itch or be painful.  You may develop swollen, bulging veins (varicose veins) in your legs.  You may have increased body aches in the pelvis, back, or thighs. This is due to weight gain and increased hormones that are relaxing your joints.  You may have changes in your hair. These can include thickening of your hair, rapid growth, and changes in texture. Some women also have hair loss during or after pregnancy, or hair that feels dry or thin. Your hair will most likely return to normal after your baby is born.  Your breasts will continue to grow and they will continue to become tender. A yellow fluid (colostrum) may leak from your breasts. This is the first milk you are producing for your baby.  Your belly button may stick out.  You may notice more swelling in your hands,  face, or ankles.  You may have increased tingling or numbness in your hands, arms, and legs. The skin on your belly may also feel numb.  You may feel short of breath because of your expanding uterus.  You may have more problems sleeping. This can be caused by the size of your belly, increased need to urinate, and an increase in your body's metabolism.  You may notice the fetus "dropping," or moving lower in your abdomen (lightening).  You may have increased vaginal discharge.  You may notice your joints feel loose and you may have pain around your pelvic bone. What to expect at prenatal visits You will have prenatal exams every 2 weeks until week 36. Then you will have weekly prenatal exams. During a routine prenatal visit:  You will be weighed to make sure you and the baby are growing normally.  Your blood pressure will be taken.  Your abdomen will be measured to track your baby's growth.  The fetal heartbeat will be listened to.  Any test results from the previous visit will be discussed.  You may have a cervical check near your due date to see if your cervix has softened or thinned (effaced).  You will be tested for Group B streptococcus. This happens between 35 and 37 weeks. Your health care provider may ask you:  What your birth plan is.  How you are feeling.  If you are feeling the baby move.  If you have had any abnormal   symptoms, such as leaking fluid, bleeding, severe headaches, or abdominal cramping.  If you are using any tobacco products, including cigarettes, chewing tobacco, and electronic cigarettes.  If you have any questions. Other tests or screenings that may be performed during your third trimester include:  Blood tests that check for low iron levels (anemia).  Fetal testing to check the health, activity level, and growth of the fetus. Testing is done if you have certain medical conditions or if there are problems during the pregnancy.  Nonstress test  (NST). This test checks the health of your baby to make sure there are no signs of problems, such as the baby not getting enough oxygen. During this test, a belt is placed around your belly. The baby is made to move, and its heart rate is monitored during movement. What is false labor? False labor is a condition in which you feel small, irregular tightenings of the muscles in the womb (contractions) that usually go away with rest, changing position, or drinking water. These are called Braxton Hicks contractions. Contractions may last for hours, days, or even weeks before true labor sets in. If contractions come at regular intervals, become more frequent, increase in intensity, or become painful, you should see your health care provider. What are the signs of labor?  Abdominal cramps.  Regular contractions that start at 10 minutes apart and become stronger and more frequent with time.  Contractions that start on the top of the uterus and spread down to the lower abdomen and back.  Increased pelvic pressure and dull back pain.  A watery or bloody mucus discharge that comes from the vagina.  Leaking of amniotic fluid. This is also known as your "water breaking." It could be a slow trickle or a gush. Let your health care provider know if it has a color or strange odor. If you have any of these signs, call your health care provider right away, even if it is before your due date. Follow these instructions at home: Medicines  Follow your health care provider's instructions regarding medicine use. Specific medicines may be either safe or unsafe to take during pregnancy.  Take a prenatal vitamin that contains at least 600 micrograms (mcg) of folic acid.  If you develop constipation, try taking a stool softener if your health care provider approves. Eating and drinking   Eat a balanced diet that includes fresh fruits and vegetables, whole grains, good sources of protein such as meat, eggs, or tofu,  and low-fat dairy. Your health care provider will help you determine the amount of weight gain that is right for you.  Avoid raw meat and uncooked cheese. These carry germs that can cause birth defects in the baby.  If you have low calcium intake from food, talk to your health care provider about whether you should take a daily calcium supplement.  Eat four or five small meals rather than three large meals a day.  Limit foods that are high in fat and processed sugars, such as fried and sweet foods.  To prevent constipation: ? Drink enough fluid to keep your urine clear or pale yellow. ? Eat foods that are high in fiber, such as fresh fruits and vegetables, whole grains, and beans. Activity  Exercise only as directed by your health care provider. Most women can continue their usual exercise routine during pregnancy. Try to exercise for 30 minutes at least 5 days a week. Stop exercising if you experience uterine contractions.  Avoid heavy lifting.  Do   not exercise in extreme heat or humidity, or at high altitudes.  Wear low-heel, comfortable shoes.  Practice good posture.  You may continue to have sex unless your health care provider tells you otherwise. Relieving pain and discomfort  Take frequent breaks and rest with your legs elevated if you have leg cramps or low back pain.  Take warm sitz baths to soothe any pain or discomfort caused by hemorrhoids. Use hemorrhoid cream if your health care provider approves.  Wear a good support bra to prevent discomfort from breast tenderness.  If you develop varicose veins: ? Wear support pantyhose or compression stockings as told by your healthcare provider. ? Elevate your feet for 15 minutes, 3-4 times a day. Prenatal care  Write down your questions. Take them to your prenatal visits.  Keep all your prenatal visits as told by your health care provider. This is important. Safety  Wear your seat belt at all times when driving.  Make  a list of emergency phone numbers, including numbers for family, friends, the hospital, and police and fire departments. General instructions  Avoid cat litter boxes and soil used by cats. These carry germs that can cause birth defects in the baby. If you have a cat, ask someone to clean the litter box for you.  Do not travel far distances unless it is absolutely necessary and only with the approval of your health care provider.  Do not use hot tubs, steam rooms, or saunas.  Do not drink alcohol.  Do not use any products that contain nicotine or tobacco, such as cigarettes and e-cigarettes. If you need help quitting, ask your health care provider.  Do not use any medicinal herbs or unprescribed drugs. These chemicals affect the formation and growth of the baby.  Do not douche or use tampons or scented sanitary pads.  Do not cross your legs for long periods of time.  To prepare for the arrival of your baby: ? Take prenatal classes to understand, practice, and ask questions about labor and delivery. ? Make a trial run to the hospital. ? Visit the hospital and tour the maternity area. ? Arrange for maternity or paternity leave through employers. ? Arrange for family and friends to take care of pets while you are in the hospital. ? Purchase a rear-facing car seat and make sure you know how to install it in your car. ? Pack your hospital bag. ? Prepare the baby's nursery. Make sure to remove all pillows and stuffed animals from the baby's crib to prevent suffocation.  Visit your dentist if you have not gone during your pregnancy. Use a soft toothbrush to brush your teeth and be gentle when you floss. Contact a health care provider if:  You are unsure if you are in labor or if your water has broken.  You become dizzy.  You have mild pelvic cramps, pelvic pressure, or nagging pain in your abdominal area.  You have lower back pain.  You have persistent nausea, vomiting, or  diarrhea.  You have an unusual or bad smelling vaginal discharge.  You have pain when you urinate. Get help right away if:  Your water breaks before 37 weeks.  You have regular contractions less than 5 minutes apart before 37 weeks.  You have a fever.  You are leaking fluid from your vagina.  You have spotting or bleeding from your vagina.  You have severe abdominal pain or cramping.  You have rapid weight loss or weight gain.  You have   shortness of breath with chest pain.  You notice sudden or extreme swelling of your face, hands, ankles, feet, or legs.  Your baby makes fewer than 10 movements in 2 hours.  You have severe headaches that do not go away when you take medicine.  You have vision changes. Summary  The third trimester is from week 28 through week 40, months 7 through 9. The third trimester is a time when the unborn baby (fetus) is growing rapidly.  During the third trimester, your discomfort may increase as you and your baby continue to gain weight. You may have abdominal, leg, and back pain, sleeping problems, and an increased need to urinate.  During the third trimester your breasts will keep growing and they will continue to become tender. A yellow fluid (colostrum) may leak from your breasts. This is the first milk you are producing for your baby.  False labor is a condition in which you feel small, irregular tightenings of the muscles in the womb (contractions) that eventually go away. These are called Braxton Hicks contractions. Contractions may last for hours, days, or even weeks before true labor sets in.  Signs of labor can include: abdominal cramps; regular contractions that start at 10 minutes apart and become stronger and more frequent with time; watery or bloody mucus discharge that comes from the vagina; increased pelvic pressure and dull back pain; and leaking of amniotic fluid. This information is not intended to replace advice given to you by your  health care provider. Make sure you discuss any questions you have with your health care provider. Document Revised: 01/11/2019 Document Reviewed: 10/26/2016 Elsevier Patient Education  2020 [employer]  

## 2020-05-08 LAB — RPR, QUANT+TP ABS (REFLEX)
Rapid Plasma Reagin, Quant: 1:1 {titer} — ABNORMAL HIGH
T Pallidum Abs: REACTIVE — AB

## 2020-05-08 LAB — RPR: RPR Ser Ql: REACTIVE — AB

## 2020-05-08 LAB — CBC
Hematocrit: 31.2 % — ABNORMAL LOW (ref 34.0–46.6)
Hemoglobin: 9.7 g/dL — ABNORMAL LOW (ref 11.1–15.9)
MCH: 28.8 pg (ref 26.6–33.0)
MCHC: 31.1 g/dL — ABNORMAL LOW (ref 31.5–35.7)
MCV: 93 fL (ref 79–97)
Platelets: 253 10*3/uL (ref 150–450)
RBC: 3.37 x10E6/uL — ABNORMAL LOW (ref 3.77–5.28)
RDW: 14.1 % (ref 11.7–15.4)
WBC: 12.8 10*3/uL — ABNORMAL HIGH (ref 3.4–10.8)

## 2020-05-08 LAB — GLUCOSE TOLERANCE, 2 HOURS W/ 1HR
Glucose, 1 hour: 176 mg/dL (ref 65–179)
Glucose, 2 hour: 81 mg/dL (ref 65–152)
Glucose, Fasting: 95 mg/dL — ABNORMAL HIGH (ref 65–91)

## 2020-05-08 LAB — HIV ANTIBODY (ROUTINE TESTING W REFLEX): HIV Screen 4th Generation wRfx: NONREACTIVE

## 2020-05-09 ENCOUNTER — Encounter: Payer: Self-pay | Admitting: Obstetrics and Gynecology

## 2020-05-09 DIAGNOSIS — O24419 Gestational diabetes mellitus in pregnancy, unspecified control: Secondary | ICD-10-CM | POA: Insufficient documentation

## 2020-05-13 ENCOUNTER — Telehealth: Payer: Self-pay

## 2020-05-13 DIAGNOSIS — O24419 Gestational diabetes mellitus in pregnancy, unspecified control: Secondary | ICD-10-CM

## 2020-05-13 MED ORDER — ACCU-CHEK GUIDE VI STRP: ORAL_STRIP | 12 refills | Status: DC

## 2020-05-13 MED ORDER — ACCU-CHEK SOFTCLIX LANCETS MISC
12 refills | Status: DC
Start: 2020-05-13 — End: 2020-07-15

## 2020-05-13 MED ORDER — ACCU-CHEK GUIDE W/DEVICE KIT: 1.0000 | PACK | Freq: Every day | 0 refills | Status: DC

## 2020-05-13 NOTE — Telephone Encounter (Signed)
S/w patient and advised of results, diabetic supplies sent, and referral placed.

## 2020-05-20 ENCOUNTER — Other Ambulatory Visit: Payer: Self-pay

## 2020-05-20 ENCOUNTER — Ambulatory Visit (INDEPENDENT_AMBULATORY_CARE_PROVIDER_SITE_OTHER): Payer: Medicaid Other | Admitting: Family Medicine

## 2020-05-20 VITALS — BP 110/67 | HR 101 | Wt >= 6400 oz

## 2020-05-20 DIAGNOSIS — Z3A30 30 weeks gestation of pregnancy: Secondary | ICD-10-CM

## 2020-05-20 DIAGNOSIS — O10919 Unspecified pre-existing hypertension complicating pregnancy, unspecified trimester: Secondary | ICD-10-CM

## 2020-05-20 DIAGNOSIS — Z23 Encounter for immunization: Secondary | ICD-10-CM

## 2020-05-20 DIAGNOSIS — O0990 Supervision of high risk pregnancy, unspecified, unspecified trimester: Secondary | ICD-10-CM | POA: Diagnosis not present

## 2020-05-20 DIAGNOSIS — O24410 Gestational diabetes mellitus in pregnancy, diet controlled: Secondary | ICD-10-CM

## 2020-05-20 NOTE — Progress Notes (Signed)
   PRENATAL VISIT NOTE  Subjective:  Laura Gutierrez is a 34 y.o. G1P0 at [redacted]w[redacted]d being seen today for ongoing prenatal care.  She is currently monitored for the following issues for this high-risk pregnancy and has Hypertension; Obesity with serious comorbidity; Supervision of high risk pregnancy, antepartum; Chronic pre-existing hypertension during pregnancy; Obesity affecting pregnancy, antepartum; and Gestational diabetes on their problem list.  Patient reports no complaints.  Contractions: Not present. Vag. Bleeding: None.  Movement: Present. Denies leaking of fluid.   The following portions of the patient's history were reviewed and updated as appropriate: allergies, current medications, past family history, past medical history, past social history, past surgical history and problem list.   Objective:   Vitals:   05/20/20 1506  BP: 110/67  Pulse: (!) 101  Weight: (!) 412 lb (186.9 kg)    Fetal Status: Fetal Heart Rate (bpm): 160   Movement: Present     General:  Alert, oriented and cooperative. Patient is in no acute distress.  Skin: Skin is warm and dry. No rash noted.   Cardiovascular: Normal heart rate noted  Respiratory: Normal respiratory effort, no problems with respiration noted  Abdomen: Soft, gravid, appropriate for gestational age.  Pain/Pressure: Absent     Pelvic: Cervical exam deferred        Extremities: Normal range of motion.  Edema: Trace  Mental Status: Normal mood and affect. Normal behavior. Normal judgment and thought content.   Assessment and Plan:  Pregnancy: G1P0 at [redacted]w[redacted]d 1. Supervision of high risk pregnancy, antepartum Continue prenatal care.  2. Chronic pre-existing hypertension during pregnancy BP is well controlled on Labetalol and ASA  3. Diet controlled gestational diabetes mellitus (GDM) in third trimester Has appointment next week Briefly discussed diet and CBG checks and logs  Preterm labor symptoms and general obstetric precautions  including but not limited to vaginal bleeding, contractions, leaking of fluid and fetal movement were reviewed in detail with the patient. Please refer to After Visit Summary for other counseling recommendations.   Return in 2 weeks (on 06/03/2020).  Future Appointments  Date Time Provider Department Center  05/27/2020  1:45 PM [facility] NURSE [facility]  05/27/2020  2:00 PM [facility] US1 [facility]  05/28/2020  9:00 AM [facility] GDM CLASS [facility]DM  05/29/2020  2:35 PM Alric Seton, MD [facility] None  06/03/2020  1:30 PM [facility]URSE [facility]  06/03/2020  1:45 PM [facility] US4 [facility]  06/10/2020  1:30 PM [facility] NURSE [facility]  06/10/2020  1:45 PM [facility] US4 [facility]    Reva Bores, MD

## 2020-05-20 NOTE — Patient Instructions (Signed)

## 2020-05-20 NOTE — Progress Notes (Signed)
Pt presents for ROB Picked up meter yesterday but has not started checking sugars yet; N&D class scheduled 8/25 Unable to void  Tdap given LD without difficulty

## 2020-05-27 ENCOUNTER — Other Ambulatory Visit: Payer: Self-pay

## 2020-05-27 ENCOUNTER — Ambulatory Visit: Payer: Medicaid Other | Admitting: *Deleted

## 2020-05-27 ENCOUNTER — Ambulatory Visit: Payer: Medicaid Other | Attending: Obstetrics and Gynecology

## 2020-05-27 ENCOUNTER — Encounter: Payer: Self-pay | Admitting: *Deleted

## 2020-05-27 DIAGNOSIS — O99332 Smoking (tobacco) complicating pregnancy, second trimester: Secondary | ICD-10-CM

## 2020-05-27 DIAGNOSIS — O10919 Unspecified pre-existing hypertension complicating pregnancy, unspecified trimester: Secondary | ICD-10-CM | POA: Diagnosis present

## 2020-05-27 DIAGNOSIS — Z363 Encounter for antenatal screening for malformations: Secondary | ICD-10-CM

## 2020-05-27 DIAGNOSIS — O99213 Obesity complicating pregnancy, third trimester: Secondary | ICD-10-CM | POA: Diagnosis not present

## 2020-05-27 DIAGNOSIS — O0990 Supervision of high risk pregnancy, unspecified, unspecified trimester: Secondary | ICD-10-CM | POA: Insufficient documentation

## 2020-05-27 DIAGNOSIS — O10013 Pre-existing essential hypertension complicating pregnancy, third trimester: Secondary | ICD-10-CM | POA: Diagnosis not present

## 2020-05-27 DIAGNOSIS — Z3A31 31 weeks gestation of pregnancy: Secondary | ICD-10-CM

## 2020-05-28 ENCOUNTER — Encounter: Payer: Medicaid Other | Attending: Family Medicine | Admitting: Dietician

## 2020-05-28 ENCOUNTER — Encounter: Payer: Self-pay | Admitting: Dietician

## 2020-05-28 ENCOUNTER — Other Ambulatory Visit: Payer: Self-pay

## 2020-05-28 DIAGNOSIS — Z3A00 Weeks of gestation of pregnancy not specified: Secondary | ICD-10-CM | POA: Diagnosis not present

## 2020-05-28 DIAGNOSIS — O99210 Obesity complicating pregnancy, unspecified trimester: Secondary | ICD-10-CM | POA: Diagnosis not present

## 2020-05-28 DIAGNOSIS — Z713 Dietary counseling and surveillance: Secondary | ICD-10-CM | POA: Insufficient documentation

## 2020-05-28 DIAGNOSIS — O24410 Gestational diabetes mellitus in pregnancy, diet controlled: Secondary | ICD-10-CM

## 2020-05-28 DIAGNOSIS — E669 Obesity, unspecified: Secondary | ICD-10-CM | POA: Diagnosis not present

## 2020-05-28 NOTE — Progress Notes (Signed)
Patient was seen on 05/28/2020 for Gestational Diabetes Self-Management class at the [facility] office. The following learning objectives were met by the patient during this course:   States the definition of Gestational Diabetes  States why dietary management is important in controlling blood glucose  Describes the effects each nutrient has on blood glucose levels  Demonstrates ability to create a balanced meal plan  Demonstrates carbohydrate counting   States when to check blood glucose levels  Demonstrates proper blood glucose monitoring techniques  States the effect of stress and exercise on blood glucose levels  States the importance of limiting caffeine and abstaining from alcohol and smoking  Blood glucose monitor given: N/A - patient already has Accu-Chek Blood glucose reading: 90  Patient instructed to monitor glucose levels: FBS: 60 - <95; 1 hour: <140; 2 hour: <120  Patient received handouts:  Nutrition Diabetes and Pregnancy  Patient will be seen for follow-up as needed.

## 2020-05-29 ENCOUNTER — Other Ambulatory Visit: Payer: Self-pay

## 2020-05-29 ENCOUNTER — Ambulatory Visit (INDEPENDENT_AMBULATORY_CARE_PROVIDER_SITE_OTHER): Payer: Medicaid Other | Admitting: Family Medicine

## 2020-05-29 VITALS — BP 130/85 | HR 100 | Wt >= 6400 oz

## 2020-05-29 DIAGNOSIS — O0990 Supervision of high risk pregnancy, unspecified, unspecified trimester: Secondary | ICD-10-CM

## 2020-05-29 DIAGNOSIS — O99210 Obesity complicating pregnancy, unspecified trimester: Secondary | ICD-10-CM

## 2020-05-29 DIAGNOSIS — O24410 Gestational diabetes mellitus in pregnancy, diet controlled: Secondary | ICD-10-CM

## 2020-05-29 DIAGNOSIS — O10919 Unspecified pre-existing hypertension complicating pregnancy, unspecified trimester: Secondary | ICD-10-CM

## 2020-05-29 NOTE — Progress Notes (Signed)
Patient reports fetal movement, denies pain. Pt states fasting BG 107 today.

## 2020-05-29 NOTE — Progress Notes (Signed)
   PRENATAL VISIT NOTE  Subjective:  Laura Gutierrez is a 34 y.o. G1P0 at [redacted]w[redacted]d being seen today for ongoing prenatal care.  She is currently monitored for the following issues for this high-risk pregnancy and has Hypertension; Obesity with serious comorbidity; Supervision of high risk pregnancy, antepartum; Chronic pre-existing hypertension during pregnancy; Obesity affecting pregnancy, antepartum; and Gestational diabetes on their problem list.  Patient reports no complaints.  Contractions: Not present. Vag. Bleeding: None.  Movement: Present. Denies leaking of fluid.   The following portions of the patient's history were reviewed and updated as appropriate: allergies, current medications, past family history, past medical history, past social history, past surgical history and problem list.   Objective:   Vitals:   05/29/20 1443  BP: (!) 143/90  Pulse: (!) 109  Weight: (!) 404 lb (183.3 kg)    Fetal Status: Fetal Heart Rate (bpm): 145   Movement: Present     General:  Alert, oriented and cooperative. Patient is in no acute distress.  Skin: Skin is warm and dry. No rash noted.   Cardiovascular: Normal heart rate noted  Respiratory: Normal respiratory effort, no problems with respiration noted  Abdomen: Soft, gravid, appropriate for gestational age.  Pain/Pressure: Absent     Pelvic: Cervical exam deferred        Extremities: Normal range of motion.  Edema: Trace  Mental Status: Normal mood and affect. Normal behavior. Normal judgment and thought content.   Assessment and Plan:  Pregnancy: G1P0 at [redacted]w[redacted]d  1. Supervision of high risk pregnancy, antepartum Doing well without concerns Taking bASA and PNV  2. Chronic pre-existing hypertension during pregnancy -Multiple elevated BP readings at prior OB appts -States home readings are well controlled -She has not taken her mid day labetalol yet due to waking up late -Will check preE labs -Compliant with labetalol 400 mg TID,  consider increase at next visit  3. Diet controlled gestational diabetes mellitus (GDM) in third trimester -had appt yesterday -FBGL okay, does not have data today so difficulty to assess -instructed to make dietary modifications and bring logs to next visit  Preterm labor symptoms and general obstetric precautions including but not limited to vaginal bleeding, contractions, leaking of fluid and fetal movement were reviewed in detail with the patient. Please refer to After Visit Summary for other counseling recommendations.   Return in about 2 weeks (around 06/12/2020) for [facility]; in person.  Future Appointments  Date Time Provider Department Center  06/03/2020  1:30 PM [facility] NURSE [facility]  06/03/2020  1:45 PM [facility] US4 [facility]  06/10/2020  1:30 PM [facility] NURSE [facility]  06/10/2020  1:45 PM [facility]    Alric Seton, MD

## 2020-05-30 LAB — COMPREHENSIVE METABOLIC PANEL
ALT: 31 IU/L (ref 0–32)
AST: 18 IU/L (ref 0–40)
Albumin/Globulin Ratio: 1.2 (ref 1.2–2.2)
Albumin: 3.6 g/dL — ABNORMAL LOW (ref 3.8–4.8)
Alkaline Phosphatase: 127 IU/L — ABNORMAL HIGH (ref 48–121)
BUN/Creatinine Ratio: 11 (ref 9–23)
BUN: 8 mg/dL (ref 6–20)
Bilirubin Total: <0.2 mg/dL (ref 0.0–1.2)
CO2: 24 mmol/L (ref 20–29)
Calcium: 9.3 mg/dL (ref 8.7–10.2)
Chloride: 103 mmol/L (ref 96–106)
Creatinine, Ser: 0.71 mg/dL (ref 0.57–1.00)
GFR calc Af Amer: 130 mL/min/{1.73_m2} (ref >59)
GFR calc non Af Amer: 113 mL/min/{1.73_m2} (ref >59)
Globulin, Total: 2.9 g/dL (ref 1.5–4.5)
Glucose: 91 mg/dL (ref 65–99)
Potassium: 4.5 mmol/L (ref 3.5–5.2)
Sodium: 137 mmol/L (ref 134–144)
Total Protein: 6.5 g/dL (ref 6.0–8.5)

## 2020-05-30 LAB — PROTEIN / CREATININE RATIO, URINE
Creatinine, Urine: 247.7 mg/dL
Protein, Ur: 33.2 mg/dL
Protein/Creat Ratio: 134 mg/g creat (ref 0–200)

## 2020-05-30 LAB — CBC
Hematocrit: 32.4 % — ABNORMAL LOW (ref 34.0–46.6)
Hemoglobin: 10.2 g/dL — ABNORMAL LOW (ref 11.1–15.9)
MCH: 28.6 pg (ref 26.6–33.0)
MCHC: 31.5 g/dL (ref 31.5–35.7)
MCV: 91 fL (ref 79–97)
Platelets: 239 10*3/uL (ref 150–450)
RBC: 3.57 x10E6/uL — ABNORMAL LOW (ref 3.77–5.28)
RDW: 14.2 % (ref 11.7–15.4)
WBC: 11.4 10*3/uL — ABNORMAL HIGH (ref 3.4–10.8)

## 2020-06-03 ENCOUNTER — Ambulatory Visit: Payer: Medicaid Other | Attending: Obstetrics and Gynecology

## 2020-06-03 ENCOUNTER — Other Ambulatory Visit: Payer: Self-pay

## 2020-06-03 ENCOUNTER — Encounter: Payer: Self-pay | Admitting: *Deleted

## 2020-06-03 ENCOUNTER — Ambulatory Visit: Payer: Medicaid Other | Admitting: *Deleted

## 2020-06-03 DIAGNOSIS — F17200 Nicotine dependence, unspecified, uncomplicated: Secondary | ICD-10-CM

## 2020-06-03 DIAGNOSIS — O10919 Unspecified pre-existing hypertension complicating pregnancy, unspecified trimester: Secondary | ICD-10-CM

## 2020-06-03 DIAGNOSIS — Z3A32 32 weeks gestation of pregnancy: Secondary | ICD-10-CM

## 2020-06-03 DIAGNOSIS — O0990 Supervision of high risk pregnancy, unspecified, unspecified trimester: Secondary | ICD-10-CM | POA: Insufficient documentation

## 2020-06-03 DIAGNOSIS — O99333 Smoking (tobacco) complicating pregnancy, third trimester: Secondary | ICD-10-CM

## 2020-06-03 DIAGNOSIS — O99213 Obesity complicating pregnancy, third trimester: Secondary | ICD-10-CM

## 2020-06-03 DIAGNOSIS — Z6841 Body Mass Index (BMI) 40.0 and over, adult: Secondary | ICD-10-CM

## 2020-06-03 DIAGNOSIS — O10013 Pre-existing essential hypertension complicating pregnancy, third trimester: Secondary | ICD-10-CM

## 2020-06-03 DIAGNOSIS — Z362 Encounter for other antenatal screening follow-up: Secondary | ICD-10-CM

## 2020-06-10 ENCOUNTER — Ambulatory Visit: Payer: Medicaid Other | Attending: Obstetrics and Gynecology

## 2020-06-10 ENCOUNTER — Ambulatory Visit: Payer: Medicaid Other | Admitting: *Deleted

## 2020-06-10 ENCOUNTER — Encounter: Payer: Self-pay | Admitting: *Deleted

## 2020-06-10 ENCOUNTER — Other Ambulatory Visit: Payer: Self-pay

## 2020-06-10 ENCOUNTER — Other Ambulatory Visit: Payer: Self-pay | Admitting: *Deleted

## 2020-06-10 DIAGNOSIS — O0990 Supervision of high risk pregnancy, unspecified, unspecified trimester: Secondary | ICD-10-CM | POA: Diagnosis present

## 2020-06-10 DIAGNOSIS — O99333 Smoking (tobacco) complicating pregnancy, third trimester: Secondary | ICD-10-CM

## 2020-06-10 DIAGNOSIS — O10913 Unspecified pre-existing hypertension complicating pregnancy, third trimester: Secondary | ICD-10-CM | POA: Diagnosis not present

## 2020-06-10 DIAGNOSIS — Z3A33 33 weeks gestation of pregnancy: Secondary | ICD-10-CM

## 2020-06-10 DIAGNOSIS — F17200 Nicotine dependence, unspecified, uncomplicated: Secondary | ICD-10-CM

## 2020-06-10 DIAGNOSIS — O10919 Unspecified pre-existing hypertension complicating pregnancy, unspecified trimester: Secondary | ICD-10-CM

## 2020-06-10 DIAGNOSIS — O99213 Obesity complicating pregnancy, third trimester: Secondary | ICD-10-CM | POA: Diagnosis not present

## 2020-06-10 DIAGNOSIS — Z362 Encounter for other antenatal screening follow-up: Secondary | ICD-10-CM

## 2020-06-12 ENCOUNTER — Other Ambulatory Visit: Payer: Self-pay

## 2020-06-12 ENCOUNTER — Encounter: Payer: Self-pay | Admitting: Obstetrics and Gynecology

## 2020-06-12 ENCOUNTER — Ambulatory Visit (INDEPENDENT_AMBULATORY_CARE_PROVIDER_SITE_OTHER): Payer: Medicaid Other | Admitting: Obstetrics and Gynecology

## 2020-06-12 VITALS — BP 138/82 | HR 109 | Wt >= 6400 oz

## 2020-06-12 DIAGNOSIS — O10919 Unspecified pre-existing hypertension complicating pregnancy, unspecified trimester: Secondary | ICD-10-CM

## 2020-06-12 DIAGNOSIS — O0990 Supervision of high risk pregnancy, unspecified, unspecified trimester: Secondary | ICD-10-CM

## 2020-06-12 DIAGNOSIS — O24410 Gestational diabetes mellitus in pregnancy, diet controlled: Secondary | ICD-10-CM

## 2020-06-12 DIAGNOSIS — O99210 Obesity complicating pregnancy, unspecified trimester: Secondary | ICD-10-CM

## 2020-06-12 NOTE — Progress Notes (Signed)
   PRENATAL VISIT NOTE  Subjective:  Laura Gutierrez is a 34 y.o. G1P0 at [redacted]w[redacted]d being seen today for ongoing prenatal care.  She is currently monitored for the following issues for this high-risk pregnancy and has Hypertension; Obesity with serious comorbidity; Supervision of high risk pregnancy, antepartum; Chronic pre-existing hypertension during pregnancy; Obesity affecting pregnancy, antepartum; and Gestational diabetes on their problem list.  Patient reports no complaints.  Contractions: Not present. Vag. Bleeding: None.  Movement: Present. Denies leaking of fluid.   The following portions of the patient's history were reviewed and updated as appropriate: allergies, current medications, past family history, past medical history, past social history, past surgical history and problem list.   Objective:   Vitals:   06/12/20 1500  BP: 138/82  Pulse: (!) 109  Weight: (!) 404 lb (183.3 kg)    Fetal Status: Fetal Heart Rate (bpm): 160 Fundal Height: 35 cm Movement: Present     General:  Alert, oriented and cooperative. Patient is in no acute distress.  Skin: Skin is warm and dry. No rash noted.   Cardiovascular: Normal heart rate noted  Respiratory: Normal respiratory effort, no problems with respiration noted  Abdomen: Soft, gravid, appropriate for gestational age.  Pain/Pressure: Absent     Pelvic: Cervical exam deferred        Extremities: Normal range of motion.  Edema: Mild pitting, slight indentation  Mental Status: Normal mood and affect. Normal behavior. Normal judgment and thought content.   Assessment and Plan:  Pregnancy: G1P0 at [redacted]w[redacted]d 1. Supervision of high risk pregnancy, antepartum Patient is doing well without complaints Cultures next visit  2. Diet controlled gestational diabetes mellitus (GDM) in third trimester Patient did not bring CBg log and reports diabetes as well controlled. Fasting as high as 83 and pp as high as 113 Deliver recommended between 37-39 per  MFM  3. Chronic pre-existing hypertension during pregnancy Continue labetalol 400 TID Continue ASA Follow up ultrasound 9/16  4. Obesity affecting pregnancy, antepartum   Preterm labor symptoms and general obstetric precautions including but not limited to vaginal bleeding, contractions, leaking of fluid and fetal movement were reviewed in detail with the patient. Please refer to After Visit Summary for other counseling recommendations.   Return in about 2 weeks (around 06/26/2020) for in person, ROB, High risk.  Future Appointments  Date Time Provider Department Center  06/19/2020  9:15 AM [facility] NURSE [facility]  06/19/2020  9:30 AM [facility] US3 [facility]  06/27/2020  1:30 PM [facility]URSE [facility]  06/27/2020  1:45 PM [facility]  07/03/2020  9:15 AM [facility] NURSE [facility]  07/03/2020  9:30 AM [facility]3 [facility]    Catalina Antigua, MD

## 2020-06-12 NOTE — Progress Notes (Signed)
404

## 2020-06-12 NOTE — Progress Notes (Signed)
Pt is having increase in LE swelling.  

## 2020-06-19 ENCOUNTER — Other Ambulatory Visit: Payer: Self-pay

## 2020-06-19 ENCOUNTER — Ambulatory Visit: Payer: Medicaid Other

## 2020-06-19 ENCOUNTER — Encounter (HOSPITAL_COMMUNITY): Payer: Self-pay | Admitting: Obstetrics and Gynecology

## 2020-06-19 ENCOUNTER — Inpatient Hospital Stay (HOSPITAL_COMMUNITY)
Admission: AD | Admit: 2020-06-19 | Discharge: 2020-06-19 | Disposition: A | Discharge status: Home / Self Care | Payer: Medicaid Other | Attending: Obstetrics and Gynecology | Admitting: Obstetrics and Gynecology

## 2020-06-19 DIAGNOSIS — O99333 Smoking (tobacco) complicating pregnancy, third trimester: Secondary | ICD-10-CM | POA: Diagnosis not present

## 2020-06-19 DIAGNOSIS — Z3A34 34 weeks gestation of pregnancy: Secondary | ICD-10-CM | POA: Insufficient documentation

## 2020-06-19 DIAGNOSIS — O0990 Supervision of high risk pregnancy, unspecified, unspecified trimester: Secondary | ICD-10-CM

## 2020-06-19 DIAGNOSIS — O10919 Unspecified pre-existing hypertension complicating pregnancy, unspecified trimester: Secondary | ICD-10-CM

## 2020-06-19 DIAGNOSIS — Z7982 Long term (current) use of aspirin: Secondary | ICD-10-CM | POA: Diagnosis not present

## 2020-06-19 DIAGNOSIS — O98513 Other viral diseases complicating pregnancy, third trimester: Secondary | ICD-10-CM | POA: Diagnosis present

## 2020-06-19 DIAGNOSIS — F17210 Nicotine dependence, cigarettes, uncomplicated: Secondary | ICD-10-CM | POA: Diagnosis not present

## 2020-06-19 DIAGNOSIS — Z79899 Other long term (current) drug therapy: Secondary | ICD-10-CM | POA: Insufficient documentation

## 2020-06-19 DIAGNOSIS — O24419 Gestational diabetes mellitus in pregnancy, unspecified control: Secondary | ICD-10-CM | POA: Diagnosis not present

## 2020-06-19 DIAGNOSIS — O99213 Obesity complicating pregnancy, third trimester: Secondary | ICD-10-CM | POA: Diagnosis not present

## 2020-06-19 DIAGNOSIS — O10913 Unspecified pre-existing hypertension complicating pregnancy, third trimester: Secondary | ICD-10-CM | POA: Insufficient documentation

## 2020-06-19 DIAGNOSIS — U071 COVID-19: Secondary | ICD-10-CM | POA: Diagnosis not present

## 2020-06-19 HISTORY — DX: Gestational diabetes mellitus in pregnancy, unspecified control: O24.419

## 2020-06-19 LAB — CBC
HCT: 31.5 % — ABNORMAL LOW (ref 36.0–46.0)
Hemoglobin: 9.4 g/dL — ABNORMAL LOW (ref 12.0–15.0)
MCH: 27.2 pg (ref 26.0–34.0)
MCHC: 29.8 g/dL — ABNORMAL LOW (ref 30.0–36.0)
MCV: 91 fL (ref 80.0–100.0)
Platelets: 218 10*3/uL (ref 150–400)
RBC: 3.46 MIL/uL — ABNORMAL LOW (ref 3.87–5.11)
RDW: 15.2 % (ref 11.5–15.5)
WBC: 9.1 10*3/uL (ref 4.0–10.5)
nRBC: 0 % (ref 0.0–0.2)

## 2020-06-19 LAB — COMPREHENSIVE METABOLIC PANEL
ALT: 34 U/L (ref 0–44)
AST: 36 U/L (ref 15–41)
Albumin: 2.6 g/dL — ABNORMAL LOW (ref 3.5–5.0)
Alkaline Phosphatase: 112 U/L (ref 38–126)
Anion gap: 8 (ref 5–15)
BUN: 5 mg/dL — ABNORMAL LOW (ref 6–20)
CO2: 22 mmol/L (ref 22–32)
Calcium: 8.9 mg/dL (ref 8.9–10.3)
Chloride: 105 mmol/L (ref 98–111)
Creatinine, Ser: 0.71 mg/dL (ref 0.44–1.00)
GFR calc Af Amer: >60 mL/min (ref >60)
GFR calc non Af Amer: >60 mL/min (ref >60)
Glucose, Bld: 89 mg/dL (ref 70–99)
Potassium: 4 mmol/L (ref 3.5–5.1)
Sodium: 135 mmol/L (ref 135–145)
Total Bilirubin: 0.5 mg/dL (ref 0.3–1.2)
Total Protein: 6.3 g/dL — ABNORMAL LOW (ref 6.5–8.1)

## 2020-06-19 LAB — PROTEIN / CREATININE RATIO, URINE
Creatinine, Urine: 221.13 mg/dL
Protein Creatinine Ratio: 0.12 mg/mg{Cre} (ref 0.00–0.15)
Total Protein, Urine: 26 mg/dL

## 2020-06-19 LAB — SARS CORONAVIRUS 2 BY RT PCR (HOSPITAL ORDER, PERFORMED IN ~~LOC~~ HOSPITAL LAB): SARS Coronavirus 2: POSITIVE — AB

## 2020-06-19 MED ORDER — METHYLPREDNISOLONE SODIUM SUCC 125 MG IJ SOLR: 125.0000 mg | Freq: Once | INTRAMUSCULAR | Status: DC | PRN

## 2020-06-19 MED ORDER — SODIUM CHLORIDE 0.9 % IV SOLN
1200.0000 mg | Freq: Once | INTRAVENOUS | Status: AC
  Administered 2020-06-19: 1200 mg via INTRAVENOUS
  Filled 2020-06-19: qty 1200

## 2020-06-19 MED ORDER — DIPHENHYDRAMINE HCL 50 MG/ML IJ SOLN: 50.0000 mg | Freq: Once | INTRAMUSCULAR | Status: DC | PRN

## 2020-06-19 MED ORDER — SODIUM CHLORIDE 0.9 % IV SOLN: INTRAVENOUS | Status: DC

## 2020-06-19 MED ORDER — LABETALOL HCL 300 MG PO TABS: 600.0000 mg | ORAL_TABLET | Freq: Three times a day (TID) | ORAL | 1 refills | Status: DC

## 2020-06-19 MED ORDER — ALBUTEROL SULFATE HFA 108 (90 BASE) MCG/ACT IN AERS
2.0000 | INHALATION_SPRAY | Freq: Once | RESPIRATORY_TRACT | Status: DC | PRN
  Filled 2020-06-19: qty 6.7

## 2020-06-19 MED ORDER — EPINEPHRINE 0.3 MG/0.3ML IJ SOAJ: 0.3000 mg | Freq: Once | INTRAMUSCULAR | Status: DC | PRN

## 2020-06-19 MED ORDER — SODIUM CHLORIDE 0.9 % IV SOLN: INTRAVENOUS | Status: DC | PRN

## 2020-06-19 MED ORDER — FAMOTIDINE IN NACL 20-0.9 MG/50ML-% IV SOLN: 20.0000 mg | Freq: Once | INTRAVENOUS | Status: DC | PRN

## 2020-06-19 NOTE — Progress Notes (Signed)
OK to d/c EFM per Wynelle Bourgeois CNM. Pt up to [facility]

## 2020-06-19 NOTE — MAU Provider Note (Signed)
Chief Complaint:  Generalized Body Aches, Cough, and Nasal Congestion   First Provider Initiated Contact with Patient 06/19/20 0436     HPI: Laura Gutierrez is a 34 y.o. G1P0 at 33w2dho presents to maternity admissions reporting cough, nasal congestion, and body aches.  No fever.  Exposed to [place]  Has chronic HTN and is on Labetalol 407mtid.  Denies headache.. She reports good fetal movement, denies LOF, vaginal bleeding, vaginal itching/burning, urinary symptoms, h/a, dizziness, n/v, diarrhea, constipation or fever/chills.  She denies headache, visual changes or RUQ abdominal pain.  Cough This is a new problem. The current episode started in the past 7 days. The problem has been unchanged. The cough is non-productive. Associated symptoms include myalgias, nasal congestion and rhinorrhea. Pertinent negatives include no chest pain, chills, ear pain, fever, headaches, sweats or wheezing. Nothing aggravates the symptoms. She has tried nothing for the symptoms.    RN Note: PT's niece and sister positive for covid and pt lives with them. They tested positive the end of last wk. Pt with symptoms Sat/Sun. Which are cough, nasal congestion, body aches. No fever. No pregnancy concerns. Just wanted to be sure the baby is ok if she has covid  Past Medical History: Past Medical History:  Diagnosis Date   Bronchitis    Gestational diabetes    Gonorrhea    Hypertension    Morbid obesity ([place]   Obesity     Past obstetric history: OB History  Gravida Para Term Preterm AB Living  1            SAB TAB Ectopic Multiple Live Births               # Outcome Date GA Lbr Len/2nd Weight Sex Delivery Anes PTL Lv  1 Current             Past Surgical History: Past Surgical History:  Procedure Laterality Date   TONSILLECTOMY      Family History: Family History  Problem Relation Age of Onset   Hypertension Mother    Alzheimer's disease Maternal Aunt    Heart disease Maternal  Grandfather    Diabetes Paternal Grandmother     Social History: Social History   Tobacco Use   Smoking status: Current Every Day Smoker    Packs/day: 0.25    Types: Cigarettes   Smokeless tobacco: Never Used  Vaping Use   Vaping Use: Never used  Substance Use Topics   Alcohol use: Not Currently    Alcohol/week: 0.0 standard drinks    Comment: sometimes     Drug use: No    Allergies:  Allergies  Allergen Reactions   Bactrim [Sulfamethoxazole-Trimethoprim] Anaphylaxis    Meds:  Medications Prior to Admission  Medication Sig Dispense Refill Last Dose   albuterol (VENTOLIN HFA) 108 (90 Base) MCG/ACT inhaler Inhale 2 puffs into the lungs every 6 (six) hours as needed for wheezing or shortness of breath. 18 g 3 Past Month at Unknown time   aspirin EC 81 MG tablet Take 1 tablet (81 mg total) by mouth daily. Take after 12 weeks for prevention of preeclampsia later in pregnancy 300 tablet 2 06/18/2020 at Unknown time   famotidine (PEPCID) 10 MG tablet Take 10 mg by mouth 2 (two) times daily.   06/18/2020 at Unknown time   labetalol (NORMODYNE) 200 MG tablet TAKE 2 TABLETS (400 MG TOTAL) BY MOUTH 3 (THREE) TIMES DAILY. 90 tablet 2 06/18/2020 at Unknown time   Prenat-Fe Poly-Methfol-FA-DHA (VITAFOL ULTRA)  29-0.6-0.4-200 MG CAPS Take 1 capsule by mouth daily before breakfast. 90 capsule 4 06/18/2020 at Unknown time   Pseudoephedrine-APAP-DM (TYLENOL COLD/FLU DAY PO) Take 2 capsules by mouth.   06/18/2020 at 2100   Accu-Chek Softclix Lancets lancets Use as instructed 100 each 12    Blood Glucose Monitoring Suppl (ACCU-CHEK GUIDE) w/Device KIT 1 kit by Does not apply route daily. 1 kit 0    Blood Pressure Monitoring (BLOOD PRESSURE KIT) DEVI 1 kit by Does not apply route once a week. Check Blood Pressure regularly and record readings into the [employer] App. XLarge Cuff.  DX O90.0 1 each 0    glucose blood (ACCU-CHEK GUIDE) test strip Use as instructed 100 each 12     I have  reviewed patient's Past Medical Hx, Surgical Hx, Family Hx, Social Hx, medications and allergies.   ROS:  Review of Systems  Constitutional: Negative for chills and fever.  HENT: Positive for rhinorrhea. Negative for ear pain.   Respiratory: Positive for cough. Negative for wheezing.   Cardiovascular: Negative for chest pain.  Musculoskeletal: Positive for myalgias.  Neurological: Negative for headaches.   Other systems negative  Physical Exam   Patient Vitals for the past 24 hrs:  BP Temp Pulse Resp SpO2 Height Weight  06/19/20 0731 (!) 152/85 -- -- -- -- -- --  06/19/20 0700 (!) 156/82 -- 97 -- -- -- --  06/19/20 0646 140/89 -- -- -- -- -- --  06/19/20 0645 140/89 98 F (36.7 C) 98 -- 98 % -- --  06/19/20 0631 (!) 142/73 -- 97 -- -- -- --  06/19/20 0616 (!) 142/66 -- (!) 102 -- -- -- --  06/19/20 0610 -- 97.7 F (36.5 C) -- 20 100 % -- --  06/19/20 0600 140/78 -- (!) 102 -- -- -- --  06/19/20 0545 (!) 151/93 -- 99 -- -- -- --  06/19/20 0530 (!) 150/91 -- 99 -- -- -- --  06/19/20 0515 134/69 -- (!) 105 -- -- -- --  06/19/20 0514 (!) 141/65 -- (!) 105 -- -- -- --  06/19/20 0430 (!) 144/83 -- (!) 109 -- -- -- --  06/19/20 0415 (!) 151/81 98.3 F (36.8 C) (!) 116 20 99 % _0  (1.626 m) (!) 180.5 kg   Constitutional: Well-developed, well-nourished female in no acute distress.  Cardiovascular: normal rate and rhythm Respiratory: normal effort, clear to auscultation bilaterally GI: Abd soft, non-tender, gravid appropriate for gestational age.   No rebound or guarding. MS: Extremities nontender, no edema, normal ROM Neurologic: Alert and oriented x 4.  DTRs 2+ no clonus GU: Neg CVAT.  PELVIC EXAM:  deferred     FHT:  Baseline 145 , moderate variability, accelerations present, no decelerations Contractions: Irregular   Labs: Results for orders placed or performed during the hospital encounter of 06/19/20 (from the past 24 hour(s))  SARS Coronavirus 2 by RT PCR (hospital  order, performed in [facility] hospital lab) Nasopharyngeal Nasopharyngeal Swab     Status: Abnormal   Collection Time: 06/19/20  4:25 AM   Specimen: Nasopharyngeal Swab  Result Value Ref Range   SARS Coronavirus 2 POSITIVE (A) NEGATIVE  CBC     Status: Abnormal   Collection Time: 06/19/20  4:47 AM  Result Value Ref Range   WBC 9.1 4.0 - 10.5 K/uL   RBC 3.46 (L) 3.87 - 5.11 MIL/uL   Hemoglobin 9.4 (L) 12.0 - 15.0 g/dL   HCT 31.5 (L) 36 - 46 %  MCV 91.0 80.0 - 100.0 fL   MCH 27.2 26.0 - 34.0 pg   MCHC 29.8 (L) 30.0 - 36.0 g/dL   RDW 15.2 11.5 - 15.5 %   Platelets 218 150 - 400 K/uL   nRBC 0.0 0.0 - 0.2 %  Comprehensive metabolic panel     Status: Abnormal   Collection Time: 06/19/20  4:47 AM  Result Value Ref Range   Sodium 135 135 - 145 mmol/L   Potassium 4.0 3.5 - 5.1 mmol/L   Chloride 105 98 - 111 mmol/L   CO2 22 22 - 32 mmol/L   Glucose, Bld 89 70 - 99 mg/dL   BUN 5 (L) 6 - 20 mg/dL   Creatinine, Ser 0.71 0.44 - 1.00 mg/dL   Calcium 8.9 8.9 - 10.3 mg/dL   Total Protein 6.3 (L) 6.5 - 8.1 g/dL   Albumin 2.6 (L) 3.5 - 5.0 g/dL   AST 36 15 - 41 U/L   ALT 34 0 - 44 U/L   Alkaline Phosphatase 112 38 - 126 U/L   Total Bilirubin 0.5 0.3 - 1.2 mg/dL   GFR calc non Af Amer >60 >60 mL/min   GFR calc Af Amer >60 >60 mL/min   Anion gap 8 5 - 15  Protein / creatinine ratio, urine     Status: None   Collection Time: 06/19/20  4:47 AM  Result Value Ref Range   Creatinine, Urine 221.13 mg/dL   Total Protein, Urine 26 mg/dL   Protein Creatinine Ratio 0.12 0.00 - 0.15 mg/mg[Cre]   O/Positive/-- (04/06 1405)  Imaging:    MAU Course/MDM: I have ordered labs and reviewed results. Labs are normal BPs consistently elevated over baseline elevations, but no severe range pressures NST reviewed Consult Dr Harolyn Rutherford with presentation, exam findings and test results. SHe recommends increasing Labetalol to 624m tid Treatments in MAU included Monoclonal Antibody infusion per protocol    Patient tolerated this very well with no side effects   What types of side effects do monoclonal antibody drugs cause?  Common side effects  In general, the more common side effects caused by monoclonal antibody drugs include:  Allergic reactions, such as hives or itching  Flu-like signs and symptoms, including chills, fatigue, fever, and muscle aches and pains  Nausea, vomiting  Diarrhea  Skin rashes  Low blood pressure   The CDC is recommending patients who receive monoclonal antibody treatments wait at least 90 days before being vaccinated.  Currently, there are no data on the safety and efficacy of mRNA COVID-19 vaccines in persons who received monoclonal antibodies or convalescent plasma as part of COVID-19 treatment. Based on the estimated half-life of such therapies as well as evidence suggesting that reinfection is uncommon in the 90 days after initial infection, vaccination should be deferred for at least 90 days, as a precautionary measure until additional information becomes available, to avoid interference of the antibody treatment with vaccine-induced immune responses. .    Assessment: Single IUP at [id number] infection Chronic hypertension  Plan: Discharge home Rx Labetalol [id number] Routine covid care reviewed and handout given Prerterm Labor precautions and fetal kick counts Follow up in Office for prenatal visits  Encouraged to return here or to other Urgent Care/ED if she develops worsening of symptoms, increase in pain, fever, or other concerning symptoms.   Pt stable at time of discharge.  MarHansel FeinsteinM, MSN Certified Nurse-Midwife 06/19/2020 7:45 AM

## 2020-06-19 NOTE — Discharge Instructions (Signed)
10 Things You Can Do to Manage Your COVID-19 Symptoms at Home If you have possible or confirmed COVID-19: 1. Stay home from work and school. And stay away from other public places. If you must go out, avoid using any kind of public transportation, ridesharing, or taxis. 2. Monitor your symptoms carefully. If your symptoms get worse, call your healthcare provider immediately. 3. Get rest and stay hydrated. 4. If you have a medical appointment, call the healthcare provider ahead of time and tell them that you have or may have COVID-19. 5. For medical emergencies, call 911 and notify the dispatch personnel that you have or may have COVID-19. 6. Cover your cough and sneezes with a tissue or use the inside of your elbow. 7. Wash your hands often with soap and water for at least 20 seconds or clean your hands with an alcohol-based hand sanitizer that contains at least 60% alcohol. 8. As much as possible, stay in a specific room and away from other people in your home. Also, you should use a separate bathroom, if available. If you need to be around other people in or outside of the home, wear a mask. 9. Avoid sharing personal items with other people in your household, like dishes, towels, and bedding. 10. Clean all surfaces that are touched often, like counters, tabletops, and doorknobs. Use household cleaning sprays or wipes according to the label instructions. [email or web address].[email or web address] 04/04/2019 This information is not intended to replace advice given to you by your health care provider. Make sure you discuss any questions you have with your health care provider. Document Revised: 09/06/2019 Document Reviewed: 09/06/2019 Elsevier Patient Education  [place].   COVID-19 Frequently Asked Questions COVID-19 (coronavirus disease) is an infection that is caused by a large family of viruses. Some viruses cause illness in people and others cause illness in animals like camels, cats, and bats. In some  cases, the viruses that cause illness in animals can spread to humans. Where did the coronavirus come from? In December 2019, [place] told the [facility]) of several cases of lung disease (human respiratory illness). These cases were linked to an open seafood and livestock market in the city of [place]. The link to the seafood and livestock market suggests that the virus may have spread from animals to humans. However, since that first outbreak in December, the virus has also been shown to spread from person to person. What is the name of the disease and the virus? Disease name Early on, this disease was called novel coronavirus. This is because scientists determined that the disease was caused by a new (novel) respiratory virus. The World Health Organization [facility] has now named the disease COVID-19, or coronavirus disease. Virus name The virus that causes the disease is called severe acute respiratory syndrome coronavirus 2 (SARS-CoV-2). More information on disease and virus naming World Health Organization [facility]): www.who.int/emergencies/diseases/novel-coronavirus-2019/technical-guidance/naming-the-coronavirus-disease-(covid-2019)-and-the-virus-that-causes-it Who is at risk for complications from coronavirus disease? Some people may be at higher risk for complications from coronavirus disease. This includes older adults and people who have chronic diseases, such as heart disease, diabetes, and lung disease. If you are at higher risk for complications, take these extra precautions:  Stay home as much as possible.  Avoid social gatherings and travel.  Avoid close contact with others. Stay at least 6 ft (2 m) away from others, if possible.  Wash your hands often with soap and water for at least 20 seconds.  Avoid touching your face, mouth, nose, or eyes.  Keep supplies on hand at home, such as food, medicine, and cleaning supplies.  If you must go out in public, wear a cloth  face covering or face mask. Make sure your mask covers your nose and mouth. How does coronavirus disease spread? The virus that causes coronavirus disease spreads easily from person to person (is contagious). You may catch the virus by:  Breathing in droplets from an infected person. Droplets can be spread by a person breathing, speaking, singing, coughing, or sneezing.  Touching something, like a table or a doorknob, that was exposed to the virus (contaminated) and then touching your mouth, nose, or eyes. Can I get the virus from touching surfaces or objects? There is still a lot that we do not know about the virus that causes coronavirus disease. Scientists are basing a lot of information on what they know about similar viruses, such as:  Viruses cannot generally survive on surfaces for long. They need a human body (host) to survive.  It is more likely that the virus is spread by close contact with people who are sick (direct contact), such as through: ? Shaking hands or hugging. ? Breathing in respiratory droplets that travel through the air. Droplets can be spread by a person breathing, speaking, singing, coughing, or sneezing.  It is less likely that the virus is spread when a person touches a surface or object that has the virus on it (indirect contact). The virus may be able to enter the body if the person touches a surface or object and then touches his or her face, eyes, nose, or mouth. Can a person spread the virus without having symptoms of the disease? It may be possible for the virus to spread before a person has symptoms of the disease, but this is most likely not the main way the virus is spreading. It is more likely for the virus to spread by being in close contact with people who are sick and breathing in the respiratory droplets spread by a person breathing, speaking, singing, coughing, or sneezing. What are the symptoms of coronavirus disease? Symptoms vary from person to  person and can range from mild to severe. Symptoms may include:  Fever or chills.  Cough.  Difficulty breathing or feeling short of breath.  Headaches, body aches, or muscle aches.  Runny or stuffy (congested) nose.  Sore throat.  New loss of taste or smell.  Nausea, vomiting, or diarrhea. These symptoms can appear anywhere from 2 to 14 days after you have been exposed to the virus. Some people may not have any symptoms. If you develop symptoms, call your health care provider. People with severe symptoms may need hospital care. Should I be tested for this virus? Your health care provider will decide whether to test you based on your symptoms, history of exposure, and your risk factors. How does a health care provider test for this virus? Health care providers will collect samples to send for testing. Samples may include:  Taking a swab of fluid from the back of your nose and throat, your nose, or your throat.  Taking fluid from the lungs by having you cough up mucus (sputum) into a sterile cup.  Taking a blood sample. Is there a treatment or vaccine for this virus? Currently, there is no vaccine to prevent coronavirus disease. Also, there are no medicines like antibiotics or antivirals to treat the virus. A person who becomes sick is given supportive care, which means rest and fluids. A person may also  relieve his or her symptoms by using over-the-counter medicines that treat sneezing, coughing, and runny nose. These are the same medicines that a person takes for the common cold. If you develop symptoms, call your health care provider. People with severe symptoms may need hospital care. What can I do to protect myself and my family from this virus?     You can protect yourself and your family by taking the same actions that you would take to prevent the spread of other viruses. Take the following actions:  Wash your hands often with soap and water for at least 20 seconds. If soap  and water are not available, use alcohol-based hand sanitizer.  Avoid touching your face, mouth, nose, or eyes.  Cough or sneeze into a tissue, sleeve, or elbow. Do not cough or sneeze into your hand or the air. ? If you cough or sneeze into a tissue, throw it away immediately and wash your hands.  Disinfect objects and surfaces that you frequently touch every day.  Stay away from people who are sick.  Avoid going out in public, follow guidance from your state and local health authorities.  Avoid crowded indoor spaces. Stay at least 6 ft (2 m) away from others.  If you must go out in public, wear a cloth face covering or face mask. Make sure your mask covers your nose and mouth.  Stay home if you are sick, except to get medical care. Call your health care provider before you get medical care. Your health care provider will tell you how long to stay home.  Make sure your vaccines are up to date. Ask your health care provider what vaccines you need. What should I do if I need to travel? Follow travel recommendations from your local health authority, the CDC, and WHO. Travel information and advice  Centers for Disease Control and Prevention (CDC): www.cdc.gov/coronavirus/2019-ncov/travelers/index.html  World Health Organization (WHO): www.who.int/emergencies/diseases/novel-coronavirus-2019/travel-advice Know the risks and take action to protect your health  You are at higher risk of getting coronavirus disease if you are traveling to areas with an outbreak or if you are exposed to travelers from areas with an outbreak.  Wash your hands often and practice good hygiene to lower the risk of catching or spreading the virus. What should I do if I am sick? General instructions to stop the spread of infection  Wash your hands often with soap and water for at least 20 seconds. If soap and water are not available, use alcohol-based hand sanitizer.  Cough or sneeze into a tissue, sleeve, or  elbow. Do not cough or sneeze into your hand or the air.  If you cough or sneeze into a tissue, throw it away immediately and wash your hands.  Stay home unless you must get medical care. Call your health care provider or local health authority before you get medical care.  Avoid public areas. Do not take public transportation, if possible.  If you can, wear a mask if you must go out of the house or if you are in close contact with someone who is not sick. Make sure your mask covers your nose and mouth. Keep your home clean  Disinfect objects and surfaces that are frequently touched every day. This may include: ? Counters and tables. ? Doorknobs and light switches. ? Sinks and faucets. ? Electronics such as phones, remote controls, keyboards, computers, and tablets.  Wash dishes in hot, soapy water or use a dishwasher. Air-dry your dishes.  Wash laundry in   hot water. Prevent infecting other household members  Let healthy household members care for children and pets, if possible. If you have to care for children or pets, wash your hands often and wear a mask.  Sleep in a different bedroom or bed, if possible.  Do not share personal items, such as razors, toothbrushes, deodorant, combs, brushes, towels, and washcloths. Where to find more information Centers for Disease Control and Prevention (CDC)  Information and news updates: CardRetirement.cz World Health Organization [facility])  Information and news updates: AffordableSalon.es  Coronavirus health topic: https://thompson-craig.com/  Questions and answers on COVID-19: kruiseway.com  Global tracker: who.sprinklr.com American Academy of Pediatrics (AAP)  Information for families: www.healthychildren.org/English/health-issues/conditions/chest-lungs/Pages/2019-Novel-Coronavirus.aspx The coronavirus situation is changing rapidly. Check  your local health authority website or the CDC and [facility] websites for updates and news. When should I contact a health care provider?  Contact your health care provider if you have symptoms of an infection, such as fever or cough, and you: ? Have been near anyone who is known to have coronavirus disease. ? Have come into contact with a person who is suspected to have coronavirus disease. ? Have traveled to an area where there is an outbreak of COVID-19. When should I get emergency medical care?  Get help right away by calling your local emergency services (911 in the [place] if you have: ? Trouble breathing. ? Pain or pressure in your chest. ? Confusion. ? Blue-tinged lips and fingernails. ? Difficulty waking from sleep. ? Symptoms that get worse. Let the emergency medical personnel know if you think you have coronavirus disease. Summary  A new respiratory virus is spreading from person to person and causing COVID-19 (coronavirus disease).  The virus that causes COVID-19 appears to spread easily. It spreads from one person to another through droplets from breathing, speaking, singing, coughing, or sneezing.  Older adults and those with chronic diseases are at higher risk of disease. If you are at higher risk for complications, take extra precautions.  There is currently no vaccine to prevent coronavirus disease. There are no medicines, such as antibiotics or antivirals, to treat the virus.  You can protect yourself and your family by washing your hands often, avoiding touching your face, and covering your coughs and sneezes. This information is not intended to replace advice given to you by your health care provider. Make sure you discuss any questions you have with your health care provider. Document Revised: 07/20/2019 Document Reviewed: 01/16/2019 Elsevier Patient Education  2020 Elsevier Inc.  COVID-19: Quarantine vs. Isolation QUARANTINE keeps someone who was in close contact with  someone who has COVID-19 away from others. If you had close contact with a person who has COVID-19  Stay home until 14 days after your last contact.  Check your temperature twice a day and watch for symptoms of COVID-19.  If possible, stay away from people who are at higher-risk for getting very sick from COVID-19. ISOLATION keeps someone who is sick or tested positive for COVID-19 without symptoms away from others, even in their own home. If you are sick and think or know you have COVID-19  Stay home until after ? At least 10 days since symptoms first appeared and ? At least 24 hours with no fever without fever-reducing medication and ? Symptoms have improved If you tested positive for COVID-19 but do not have symptoms  Stay home until after ? 10 days have passed since your positive test If you live with others, stay in a specific "sick room"  or area and away from other people or animals, including pets. Use a separate bathroom, if available. °cdc.gov/coronavirus °04/23/2019 °This information is not intended to replace advice given to you by your health care provider. Make sure you discuss any questions you have with your health care provider. °Document Revised: 09/06/2019 Document Reviewed: 09/06/2019 °Elsevier Patient Education © 2020 Elsevier Inc. ° °

## 2020-06-19 NOTE — MAU Note (Signed)
Covid swab obtained without difficulty and pt tol well. 

## 2020-06-19 NOTE — MAU Note (Signed)
PT's niece and sister positive for covid and pt lives with them. They tested positive the end of last wk. Pt with symptoms Sat/Sun. Which are cough, nasal congestion, body aches. No fever. No pregnancy concerns. Just wanted to be sure the baby is ok if she has covid

## 2020-06-20 DIAGNOSIS — O98513 Other viral diseases complicating pregnancy, third trimester: Secondary | ICD-10-CM | POA: Diagnosis present

## 2020-06-26 ENCOUNTER — Encounter: Payer: Medicaid Other | Admitting: Obstetrics and Gynecology

## 2020-06-27 ENCOUNTER — Ambulatory Visit: Payer: Medicaid Other

## 2020-07-03 ENCOUNTER — Ambulatory Visit: Payer: Medicaid Other

## 2020-07-07 ENCOUNTER — Other Ambulatory Visit (HOSPITAL_COMMUNITY)
Admission: RE | Admit: 2020-07-07 | Discharge: 2020-07-07 | Disposition: A | Discharge status: Home / Self Care | Payer: Medicaid Other | Source: Ambulatory Visit | Attending: Obstetrics and Gynecology | Admitting: Obstetrics and Gynecology

## 2020-07-07 ENCOUNTER — Other Ambulatory Visit: Payer: Self-pay

## 2020-07-07 ENCOUNTER — Encounter: Payer: Self-pay | Admitting: Obstetrics and Gynecology

## 2020-07-07 ENCOUNTER — Other Ambulatory Visit: Payer: Self-pay | Admitting: Family Medicine

## 2020-07-07 ENCOUNTER — Ambulatory Visit (INDEPENDENT_AMBULATORY_CARE_PROVIDER_SITE_OTHER): Payer: Medicaid Other | Admitting: Obstetrics and Gynecology

## 2020-07-07 VITALS — BP 127/78 | HR 98 | Wt >= 6400 oz

## 2020-07-07 DIAGNOSIS — O0990 Supervision of high risk pregnancy, unspecified, unspecified trimester: Secondary | ICD-10-CM | POA: Insufficient documentation

## 2020-07-07 DIAGNOSIS — O10919 Unspecified pre-existing hypertension complicating pregnancy, unspecified trimester: Secondary | ICD-10-CM

## 2020-07-07 DIAGNOSIS — O99210 Obesity complicating pregnancy, unspecified trimester: Secondary | ICD-10-CM

## 2020-07-07 DIAGNOSIS — O24410 Gestational diabetes mellitus in pregnancy, diet controlled: Secondary | ICD-10-CM

## 2020-07-07 NOTE — Progress Notes (Signed)
   PRENATAL VISIT NOTE  Subjective:  Laura Gutierrez is a 34 y.o. G1P0 at [redacted]w[redacted]d being seen today for ongoing prenatal care.  She is currently monitored for the following issues for this high-risk pregnancy and has Hypertension; Obesity with serious comorbidity; Supervision of high risk pregnancy, antepartum; Chronic pre-existing hypertension during pregnancy; Obesity affecting pregnancy, antepartum; Gestational diabetes; and COVID-19 affecting pregnancy in third trimester on their problem list.  Patient reports no complaints.  Contractions: Not present. Vag. Bleeding: None.  Movement: Present. Denies leaking of fluid.   The following portions of the patient's history were reviewed and updated as appropriate: allergies, current medications, past family history, past medical history, past social history, past surgical history and problem list.   Objective:   Vitals:   07/07/20 0940  BP: 127/78  Pulse: 98  Weight: (!) 402 lb (182.3 kg)    Fetal Status: Fetal Heart Rate (bpm): 153 Fundal Height: 40 cm Movement: Present     General:  Alert, oriented and cooperative. Patient is in no acute distress.  Skin: Skin is warm and dry. No rash noted.   Cardiovascular: Normal heart rate noted  Respiratory: Normal respiratory effort, no problems with respiration noted  Abdomen: Soft, gravid, appropriate for gestational age.  Pain/Pressure: Present     Pelvic: Cervical exam performed in the presence of a chaperone Dilation: 1 Effacement (%): Thick Station: Ballotable  Extremities: Normal range of motion.  Edema: Mild pitting, slight indentation  Mental Status: Normal mood and affect. Normal behavior. Normal judgment and thought content.   Assessment and Plan:  Pregnancy: G1P0 at [redacted]w[redacted]d 1. Supervision of high risk pregnancy, antepartum Patient is doing well without complaints Cultures today  2. Chronic pre-existing hypertension during pregnancy Continue ASA and labetalol  3. Diet controlled  gestational diabetes mellitus (GDM) in third trimester Patient did not bring CBG log but reports all numbers as "being good" Per MFM IOL recommended between 37-39 weeks. Patient scheduled for IOL on 10/9  4. Obesity affecting pregnancy, antepartum   Preterm labor symptoms and general obstetric precautions including but not limited to vaginal bleeding, contractions, leaking of fluid and fetal movement were reviewed in detail with the patient. Please refer to After Visit Summary for other counseling recommendations.   Return in about 6 weeks (around 08/18/2020) for postpartum.  Future Appointments  Date Time Provider Department Center  07/08/2020  7:30 AM [facility] NURSE [facility]  07/08/2020  7:45 AM [facility] US5 [facility]  07/12/2020 12:00 AM [facility] SCHED ROOM [facility] None    Catalina Antigua, MD

## 2020-07-07 NOTE — Progress Notes (Signed)
ROB, c/o pain in her navel whenever the baby moves.

## 2020-07-08 ENCOUNTER — Encounter: Payer: Self-pay | Admitting: *Deleted

## 2020-07-08 ENCOUNTER — Ambulatory Visit (HOSPITAL_BASED_OUTPATIENT_CLINIC_OR_DEPARTMENT_OTHER): Payer: Medicaid Other

## 2020-07-08 ENCOUNTER — Telehealth (HOSPITAL_COMMUNITY): Payer: Self-pay | Admitting: *Deleted

## 2020-07-08 ENCOUNTER — Ambulatory Visit: Payer: Medicaid Other | Attending: Obstetrics and Gynecology | Admitting: *Deleted

## 2020-07-08 ENCOUNTER — Encounter (HOSPITAL_COMMUNITY): Payer: Self-pay | Admitting: *Deleted

## 2020-07-08 ENCOUNTER — Other Ambulatory Visit: Payer: Self-pay | Admitting: Maternal & Fetal Medicine

## 2020-07-08 DIAGNOSIS — Z79899 Other long term (current) drug therapy: Secondary | ICD-10-CM | POA: Diagnosis not present

## 2020-07-08 DIAGNOSIS — Z3A37 37 weeks gestation of pregnancy: Secondary | ICD-10-CM | POA: Insufficient documentation

## 2020-07-08 DIAGNOSIS — O24419 Gestational diabetes mellitus in pregnancy, unspecified control: Secondary | ICD-10-CM | POA: Insufficient documentation

## 2020-07-08 DIAGNOSIS — O10919 Unspecified pre-existing hypertension complicating pregnancy, unspecified trimester: Secondary | ICD-10-CM

## 2020-07-08 DIAGNOSIS — Z362 Encounter for other antenatal screening follow-up: Secondary | ICD-10-CM

## 2020-07-08 DIAGNOSIS — O10913 Unspecified pre-existing hypertension complicating pregnancy, third trimester: Secondary | ICD-10-CM | POA: Insufficient documentation

## 2020-07-08 DIAGNOSIS — U071 COVID-19: Secondary | ICD-10-CM

## 2020-07-08 DIAGNOSIS — Z8616 Personal history of COVID-19: Secondary | ICD-10-CM | POA: Diagnosis not present

## 2020-07-08 DIAGNOSIS — Z720 Tobacco use: Secondary | ICD-10-CM

## 2020-07-08 DIAGNOSIS — O0990 Supervision of high risk pregnancy, unspecified, unspecified trimester: Secondary | ICD-10-CM

## 2020-07-08 DIAGNOSIS — O99333 Smoking (tobacco) complicating pregnancy, third trimester: Secondary | ICD-10-CM | POA: Diagnosis not present

## 2020-07-08 DIAGNOSIS — O99213 Obesity complicating pregnancy, third trimester: Secondary | ICD-10-CM

## 2020-07-08 DIAGNOSIS — I10 Essential (primary) hypertension: Secondary | ICD-10-CM

## 2020-07-08 LAB — CERVICOVAGINAL ANCILLARY ONLY
Chlamydia: NEGATIVE
Comment: NEGATIVE
Comment: NORMAL
Neisseria Gonorrhea: NEGATIVE

## 2020-07-08 NOTE — Telephone Encounter (Signed)
Preadmission screen  

## 2020-07-09 LAB — STREP GP B NAA: Strep Gp B NAA: NEGATIVE

## 2020-07-10 ENCOUNTER — Other Ambulatory Visit (HOSPITAL_COMMUNITY): Payer: Medicaid Other

## 2020-07-12 ENCOUNTER — Inpatient Hospital Stay (HOSPITAL_COMMUNITY)
Admission: AD | Admit: 2020-07-12 | Discharge: 2020-07-15 | DRG: 807 | Disposition: A | Discharge status: Home / Self Care | Payer: Medicaid Other | Attending: Obstetrics & Gynecology | Admitting: Obstetrics & Gynecology

## 2020-07-12 ENCOUNTER — Other Ambulatory Visit: Payer: Self-pay

## 2020-07-12 ENCOUNTER — Inpatient Hospital Stay (HOSPITAL_COMMUNITY): Payer: Medicaid Other

## 2020-07-12 ENCOUNTER — Encounter (HOSPITAL_COMMUNITY): Payer: Self-pay | Admitting: Obstetrics and Gynecology

## 2020-07-12 DIAGNOSIS — O0990 Supervision of high risk pregnancy, unspecified, unspecified trimester: Secondary | ICD-10-CM

## 2020-07-12 DIAGNOSIS — O98513 Other viral diseases complicating pregnancy, third trimester: Secondary | ICD-10-CM | POA: Diagnosis present

## 2020-07-12 DIAGNOSIS — Z3A37 37 weeks gestation of pregnancy: Secondary | ICD-10-CM | POA: Diagnosis not present

## 2020-07-12 DIAGNOSIS — O24420 Gestational diabetes mellitus in childbirth, diet controlled: Secondary | ICD-10-CM | POA: Diagnosis present

## 2020-07-12 DIAGNOSIS — U071 COVID-19: Secondary | ICD-10-CM | POA: Diagnosis present

## 2020-07-12 DIAGNOSIS — O24419 Gestational diabetes mellitus in pregnancy, unspecified control: Secondary | ICD-10-CM | POA: Diagnosis present

## 2020-07-12 DIAGNOSIS — F17210 Nicotine dependence, cigarettes, uncomplicated: Secondary | ICD-10-CM | POA: Diagnosis present

## 2020-07-12 DIAGNOSIS — O1002 Pre-existing essential hypertension complicating childbirth: Secondary | ICD-10-CM | POA: Diagnosis present

## 2020-07-12 DIAGNOSIS — O99214 Obesity complicating childbirth: Secondary | ICD-10-CM | POA: Diagnosis present

## 2020-07-12 DIAGNOSIS — O99334 Smoking (tobacco) complicating childbirth: Secondary | ICD-10-CM | POA: Diagnosis present

## 2020-07-12 DIAGNOSIS — Z3490 Encounter for supervision of normal pregnancy, unspecified, unspecified trimester: Secondary | ICD-10-CM | POA: Diagnosis present

## 2020-07-12 DIAGNOSIS — O99210 Obesity complicating pregnancy, unspecified trimester: Secondary | ICD-10-CM | POA: Diagnosis present

## 2020-07-12 DIAGNOSIS — I10 Essential (primary) hypertension: Secondary | ICD-10-CM | POA: Diagnosis present

## 2020-07-12 DIAGNOSIS — E669 Obesity, unspecified: Secondary | ICD-10-CM | POA: Diagnosis present

## 2020-07-12 DIAGNOSIS — O10919 Unspecified pre-existing hypertension complicating pregnancy, unspecified trimester: Secondary | ICD-10-CM | POA: Diagnosis present

## 2020-07-12 DIAGNOSIS — Z8616 Personal history of COVID-19: Secondary | ICD-10-CM

## 2020-07-12 HISTORY — DX: Syphilis, unspecified: A53.9

## 2020-07-12 LAB — CBC
HCT: 35.8 % — ABNORMAL LOW (ref 36.0–46.0)
Hemoglobin: 10.4 g/dL — ABNORMAL LOW (ref 12.0–15.0)
MCH: 26.7 pg (ref 26.0–34.0)
MCHC: 29.1 g/dL — ABNORMAL LOW (ref 30.0–36.0)
MCV: 92 fL (ref 80.0–100.0)
Platelets: 222 10*3/uL (ref 150–400)
RBC: 3.89 MIL/uL (ref 3.87–5.11)
RDW: 16 % — ABNORMAL HIGH (ref 11.5–15.5)
WBC: 11 10*3/uL — ABNORMAL HIGH (ref 4.0–10.5)
nRBC: 0 % (ref 0.0–0.2)

## 2020-07-12 LAB — TYPE AND SCREEN
ABO/RH(D): O POS
Antibody Screen: NEGATIVE

## 2020-07-12 LAB — COMPREHENSIVE METABOLIC PANEL
ALT: 24 U/L (ref 0–44)
AST: 21 U/L (ref 15–41)
Albumin: 2.5 g/dL — ABNORMAL LOW (ref 3.5–5.0)
Alkaline Phosphatase: 145 U/L — ABNORMAL HIGH (ref 38–126)
Anion gap: 8 (ref 5–15)
BUN: 6 mg/dL (ref 6–20)
CO2: 25 mmol/L (ref 22–32)
Calcium: 8.8 mg/dL — ABNORMAL LOW (ref 8.9–10.3)
Chloride: 103 mmol/L (ref 98–111)
Creatinine, Ser: 0.77 mg/dL (ref 0.44–1.00)
GFR, Estimated: >60 mL/min (ref >60)
Glucose, Bld: 86 mg/dL (ref 70–99)
Potassium: 3.8 mmol/L (ref 3.5–5.1)
Sodium: 136 mmol/L (ref 135–145)
Total Bilirubin: 0.2 mg/dL — ABNORMAL LOW (ref 0.3–1.2)
Total Protein: 6.1 g/dL — ABNORMAL LOW (ref 6.5–8.1)

## 2020-07-12 LAB — PROTEIN / CREATININE RATIO, URINE
Creatinine, Urine: 105.38 mg/dL
Protein Creatinine Ratio: 0.11 mg/mg{Cre} (ref 0.00–0.15)
Total Protein, Urine: 12 mg/dL

## 2020-07-12 LAB — GLUCOSE, CAPILLARY
Glucose-Capillary: 91 mg/dL (ref 70–99)
Glucose-Capillary: 83 mg/dL (ref 70–99)
Glucose-Capillary: 101 mg/dL — ABNORMAL HIGH (ref 70–99)
Glucose-Capillary: 88 mg/dL (ref 70–99)

## 2020-07-12 LAB — OB RESULTS CONSOLE RPR: RPR: REACTIVE

## 2020-07-12 LAB — RPR
RPR Ser Ql: REACTIVE — AB
RPR Titer: 1:1 {titer}

## 2020-07-12 MED ORDER — LACTATED RINGERS IV SOLN: 500.0000 mL | INTRAVENOUS | Status: DC | PRN

## 2020-07-12 MED ORDER — MISOPROSTOL 25 MCG QUARTER TABLET
25.0000 ug | ORAL_TABLET | Freq: Once | ORAL | Status: AC
  Administered 2020-07-12: 25 ug via VAGINAL

## 2020-07-12 MED ORDER — MISOPROSTOL 25 MCG QUARTER TABLET
ORAL_TABLET | ORAL | Status: AC
  Filled 2020-07-12: qty 1

## 2020-07-12 MED ORDER — OXYTOCIN BOLUS FROM INFUSION
333.0000 mL | Freq: Once | INTRAVENOUS | Status: AC
  Administered 2020-07-13: 333 mL via INTRAVENOUS

## 2020-07-12 MED ORDER — SOD CITRATE-CITRIC ACID 500-334 MG/5ML PO SOLN
30.0000 mL | ORAL | Status: DC | PRN
  Administered 2020-07-13: 30 mL via ORAL
  Filled 2020-07-12: qty 15

## 2020-07-12 MED ORDER — ACETAMINOPHEN 325 MG PO TABS: 650.0000 mg | ORAL_TABLET | ORAL | Status: DC | PRN

## 2020-07-12 MED ORDER — LABETALOL HCL 200 MG PO TABS
600.0000 mg | ORAL_TABLET | Freq: Three times a day (TID) | ORAL | Status: DC
  Administered 2020-07-12 – 2020-07-13 (×4): 600 mg via ORAL
  Filled 2020-07-12 (×4): qty 3

## 2020-07-12 MED ORDER — OXYCODONE-ACETAMINOPHEN 5-325 MG PO TABS: 1.0000 | ORAL_TABLET | ORAL | Status: DC | PRN

## 2020-07-12 MED ORDER — ONDANSETRON HCL 4 MG/2ML IJ SOLN: 4.0000 mg | Freq: Four times a day (QID) | INTRAMUSCULAR | Status: DC | PRN

## 2020-07-12 MED ORDER — MISOPROSTOL 50MCG HALF TABLET
ORAL_TABLET | ORAL | Status: AC
  Administered 2020-07-12: 50 ug via BUCCAL
  Filled 2020-07-12: qty 1

## 2020-07-12 MED ORDER — LIDOCAINE HCL (PF) 1 % IJ SOLN
30.0000 mL | INTRAMUSCULAR | Status: AC | PRN
  Administered 2020-07-13: 30 mL via SUBCUTANEOUS
  Filled 2020-07-12: qty 30

## 2020-07-12 MED ORDER — TERBUTALINE SULFATE 1 MG/ML IJ SOLN
0.2500 mg | Freq: Once | INTRAMUSCULAR | Status: DC | PRN
  Filled 2020-07-12: qty 1

## 2020-07-12 MED ORDER — LACTATED RINGERS IV SOLN: INTRAVENOUS | Status: DC

## 2020-07-12 MED ORDER — OXYTOCIN-SODIUM CHLORIDE 30-0.9 UT/500ML-% IV SOLN
2.5000 [IU]/h | INTRAVENOUS | Status: DC
  Administered 2020-07-13: 2.5 [IU]/h via INTRAVENOUS

## 2020-07-12 MED ORDER — MISOPROSTOL 50MCG HALF TABLET: 50.0000 ug | ORAL_TABLET | ORAL | Status: DC | PRN

## 2020-07-12 MED ORDER — OXYCODONE-ACETAMINOPHEN 5-325 MG PO TABS: 2.0000 | ORAL_TABLET | ORAL | Status: DC | PRN

## 2020-07-12 MED ORDER — TERBUTALINE SULFATE 1 MG/ML IJ SOLN
0.2500 mg | Freq: Once | INTRAMUSCULAR | Status: AC | PRN
  Administered 2020-07-13: 0.25 mg via SUBCUTANEOUS

## 2020-07-12 MED ORDER — OXYTOCIN-SODIUM CHLORIDE 30-0.9 UT/500ML-% IV SOLN
1.0000 m[IU]/min | INTRAVENOUS | Status: DC
  Administered 2020-07-12: 2 m[IU]/min via INTRAVENOUS

## 2020-07-12 NOTE — Progress Notes (Signed)
Pt called out feeling "wet" and states she feels fluid coming out with uc's.  Perineum wet, but no significant amount of fluid noted on pad.  Will continue to monitor for more leaking of fluid.

## 2020-07-12 NOTE — Progress Notes (Signed)
I was requested to come speak with patient's visitors for further clarification of the visitor policy. Patient was found covid + from test result on 06/19/20. While the patient is out of the 10 days window, the patient remains symptomatic with cough, runny nose, etc. Guidelines are to not retest until 90 days after initial positive test, providers were asked about retesting and they declined. So family was told only one visitor was allowed to stay and they would have to remain in the room throughout the patient's stay in the hospital. Patient's mother is the designated visitor. Patient's sister left very disgruntled. Primary RN and L&D RN [place] were made aware of the decision made and that all parties are aware of the policy and expected to abide by them while patient is admitted as inpatient.   Darrow Bussing, [facility]

## 2020-07-12 NOTE — Progress Notes (Addendum)
Labor Progress Note  Subjective:  Doing well. Feeling contractions, uncomfortable, rated 8/10.  Pacing around room and leaning on bed for support while breathing through contractions.   Objective:  BP (!) 149/89   Pulse 92   Temp 98.1 F (36.7 C) (Oral)   Resp 16   Ht 5\' 6"  (1.676 m)   Wt (!) 181.4 kg   LMP 10/23/2019 (Exact Date)   SpO2 100%   BMI 64.56 kg/m  Gen: Moderate distress during contractions.  Extremities: No signs of DVT.   CE: Dilation: 3 Effacement (%): Thick Station: Ballotable Presentation: Vertex Exam by:: Dr. [email or web address] Contractions: irregular FH: BL 150, moderate var, + accels, no decels.   Assessment and Plan:  Laura Gutierrez is a 34 y.o. G1P0 at [redacted]w[redacted]d admitted for IOL 2/2 CHTN  Labor:  Cytotec x 2. Placed Cooks 1500. Pitocin 2115.   Pain control: IV pain meds Anticipated MOD: NSVD PPH Risk: medium. maternal BMI > 40. Type & screen obtained.    cHTN  Continue home 600 mg Labetalol TID PRN orders for severe pressures Monitor pressures closely  CMP pending  UPC 0.11, PreE labs unremarkable   A1GDM 91>83>101>88 Q4 hour CBG in latent labor, Q2 hour in active labor   Fetal Wellbeing: Category I GBS Negative/-- (10/04 1057)  Continuous fetal monitoring  04-05-2005, MD  Attestation of Supervision of Student:  I confirm that I have verified the information documented in the  resident's  note and that I have also personally reperformed the history, physical exam and all medical decision making activities.  I have verified that all services and findings are accurately documented in this student's note; and I agree with management and plan as outlined in the documentation. I have also made any necessary editorial changes.  Fayette Pho, MD Center for [facility], [facility] 07/13/2020 5:31 AM

## 2020-07-12 NOTE — H&P (Addendum)
Obstetric Admission History & Physical  Subjective Laura Gutierrez is a 34 y.o. female G1P0 with IUP at [redacted]w[redacted]d by LMP admitted for IOL CHTN. Reports fetal movement. Denies vaginal bleeding and ROM. She received her prenatal care at [facility].  Support person in labor: MOB and sister  Ultrasounds . 19+0 : EFW 314 g 88% - limited anatomy  . 23+0 EFW 608g 71% - limited anatomy  . 27+0 EFW 1068g 54% - Amniotic fluid is slightly increased. . 31+0 EFW 1922g 78%, AC 94% . 37+0 EFW 3179g 65%, AC 96%, AFI 7  Prenatal History/Complications: . BMI 65 . CHTN (labetalol 600 mg TID)  . A1GDM  . COVID Mab infusion 06/19/20, + PCR 9/23  Past Medical History:  Diagnosis Date  . Bronchitis   . Gestational diabetes   . Gonorrhea   . Hypertension   . Morbid obesity (HCC)   . Obesity    Past Surgical History:  Procedure Laterality Date  . TONSILLECTOMY     OB History    Gravida  1   Para      Term      Preterm      AB      Living        SAB      TAB      Ectopic      Multiple      Live Births             Social History   Socioeconomic History  . Marital status: Single    Spouse name: Not on file  . Number of children: Not on file  . Years of education: Not on file  . Highest education level: Not on file  Occupational History  . Not on file  Tobacco Use  . Smoking status: Current Every Day Smoker    Packs/day: 0.25    Types: Cigarettes  . Smokeless tobacco: Never Used  Vaping Use  . Vaping Use: Never used  Substance and Sexual Activity  . Alcohol use: Not Currently    Alcohol/week: 0.0 standard drinks    Comment: sometimes    . Drug use: No  . Sexual activity: Not Currently    Birth control/protection: None  Other Topics Concern  . Not on file  Social History Narrative  . Not on file   Social Determinants of Health   Financial Resource Strain:   . Difficulty of Paying Living Expenses: Not on file  Food Insecurity:   . Worried About Programme researcher, broadcasting/film/video  in the Last Year: Not on file  . Ran Out of Food in the Last Year: Not on file  Transportation Needs:   . Lack of Transportation (Medical): Not on file  . Lack of Transportation (Non-Medical): Not on file  Physical Activity:   . Days of Exercise per Week: Not on file  . Minutes of Exercise per Session: Not on file  Stress:   . Feeling of Stress : Not on file  Social Connections:   . Frequency of Communication with Friends and Family: Not on file  . Frequency of Social Gatherings with Friends and Family: Not on file  . Attends Religious Services: Not on file  . Active Member of Clubs or Organizations: Not on file  . Attends Banker Meetings: Not on file  . Marital Status: Not on file   Family History  Problem Relation Age of Onset  . Hypertension Mother   . Alzheimer's disease Maternal Aunt   .  Heart disease Maternal Grandfather   . Diabetes Paternal Grandmother    Allergies: Allergies  Allergen Reactions  . Bactrim [Sulfamethoxazole-Trimethoprim] Anaphylaxis  . Latex     itching   Medications:  No outpatient medications have been marked as taking for the 07/12/20 encounter [facility] Encounter).    Review of Systems  All systems reviewed and negative except as stated in HPI  Objective Physical Exam:  BP (!) 143/90   Pulse 94   Temp 98.6 F (37 C) (Oral)   Resp 20   Ht 5\' 6"  (1.676 m)   Wt (!) 181.4 kg   LMP 10/23/2019 (Exact Date)   SpO2 100%   BMI 64.56 kg/m  General appearance: appears stated age, no distress and morbidly obese Lungs: no respiratory distress Heart: RRR. No BLEE.  Abdomen: soft, non-tender; gravid  Extremities: Moving spontaneously, warm, well perfused. 2+ DP.  Uterine activity: no contraction pain Cervical Exam:    Presentation: cephalic Fetal monitoring: baseline 150 / mod variability/ +a / none d  Prenatal labs: ABO, Rh: O/Positive/-- (04/06 1405) Antibody: Negative (04/06 1405) Rubella: 3.18 (04/06 1405) RPR:  Reactive (08/03 1134)  HBsAg: Negative (04/06 1405)  HIV: Non Reactive (08/03 1134)  GBS: Negative/-- (10/04 1057)  Glucola: 2 hr 95/176/81 Genetic screening:  LR NIPS  Prenatal Transfer Tool  Maternal Diabetes: Yes:  Diabetes Type:  Diet controlled Genetic Screening: Normal Maternal Ultrasounds/Referrals: Normal Fetal Ultrasounds or other Referrals:  None Maternal Substance Abuse:  No Significant Maternal Medications:  None Significant Maternal Lab Results: Group B Strep negative  Results for orders placed or performed during the hospital encounter of 07/12/20 (from the past 24 hour(s))  CBC   Collection Time: 07/12/20  6:26 AM  Result Value Ref Range   WBC 11.0 (H) 4.0 - 10.5 K/uL   RBC 3.89 3.87 - 5.11 MIL/uL   Hemoglobin 10.4 (L) 12.0 - 15.0 g/dL   HCT 09/11/20 (L) 36 - 46 %   MCV 92.0 80.0 - 100.0 fL   MCH 26.7 26.0 - 34.0 pg   MCHC 29.1 (L) 30.0 - 36.0 g/dL   RDW 62.9 (H) 52.8 - 41.3 %   Platelets 222 150 - 400 K/uL   nRBC 0.0 0.0 - 0.2 %    Patient Active Problem List   Diagnosis Date Noted  . Encounter for induction of labor 07/12/2020  . COVID-19 affecting pregnancy in third trimester 06/20/2020  . Gestational diabetes 05/09/2020  . Chronic pre-existing hypertension during pregnancy 03/25/2020  . Obesity affecting pregnancy, antepartum 03/25/2020  . Supervision of high risk pregnancy, antepartum 01/08/2020  . Hypertension 12/23/2016  . Obesity with serious comorbidity 12/23/2016    Assessment & Plan:  [name] is a 34 y.o. G1P0 at [redacted]w[redacted]d admitted for IOL for CHTN  Labor:   IOL. Discussed induction process with patient and she expresses understanding. External 1cm, closed internal. Attempted to place FB without success. Buccal cytotec x 1 @ 1037. Cephalic confirmed via ultrasound.  . Pain control: IV pain meds and per patient preference. . Anticipated MOD: NSVD PPH Risk: medium. maternal BMI > 40. Type & screen obtained.   CHTN  Continue home 600 mg  Labetalol TID  PRN orders for severe pressures  Monitor pressures closely   UPC & CMP pending   A1GDM  Q4 hour CBG in latent labor, Q2 hour in active labor   Fetal Wellbeing: Category I . GBS Negative/-- (10/04 1057)  . Continuous fetal monitoring  Postpartum Planning . Boy (yes)/both/undecided .  Rubella immune, Tdap provided during [facility]   Genia Hotter, M.D.  Family Medicine  PGY-3 07/12/2020 7:25 AM  Midwife attestation: I have seen and examined this patient; I agree with above documentation in the resident's note.   PE: Gen: calm comfortable, NAD Resp: normal effort and rate Abd: gravid  ROS, labs, PMH reviewed EFM: 150 bpm, mod variability, + accels, no decels Toco: irreg  Assessment/Plan: [redacted] weeks gestation Labor: latent FWB: Cat I GBS: neg Admit to LD Cytotec for ripening, foley when able Anticipate SVD  Donette Larry, CNM  07/12/2020, 3:20 PM

## 2020-07-12 NOTE — Progress Notes (Signed)
Labor Progress Note  Subjective:  Doing well. Not feeling any contractions   Objective:  BP (!) 143/78   Pulse 93   Temp 97.7 F (36.5 C) (Oral)   Resp 18   Ht 5\' 6"  (1.676 m)   Wt (!) 181.4 kg   LMP 10/23/2019 (Exact Date)   SpO2 100%   BMI 64.56 kg/m  Gen: Lying in bed comfortably. NAD.  Extremities: No signs of DVT.   CE: Dilation: Fingertip Effacement (%): Thick Station: Ballotable Presentation: Vertex (via 10/25/2019) Exam by:: Dr [email or web address] Contractions: irregular FH: BL 145, moderate var, + a, none decels.   Assessment and Plan:  Laura Gutierrez is a 34 y.o. G1P0 at [redacted]w[redacted]d admitted for IOL CHTN   Labor:  Cytotec x 2. Placed Cooks and pt tolerated well.  . Pain control: IV pain meds . Anticipated MOD: NSVD PPH Risk: medium. maternal BMI > 40. Type & screen obtained.   CHTN  Continue home 600 mg Labetalol TID  PRN orders for severe pressures  Monitor pressures closely   UPC & CMP pending   A1GDM 91>83  Q4 hour CBG in latent labor, Q2 hour in active labor   Fetal Wellbeing: Category I . GBS Negative/-- (10/04 1057)  . Continuous fetal monitoring  04-05-2005, M.D.  FM PGY 3 07/12/2020 3:13 PM

## 2020-07-12 NOTE — Progress Notes (Signed)
Pt here for IOL of labor, tested positive for covid on 9/16. Pt states she's symptomatic.  This RN witnessed pt coughing.  Placed pt in negative pressure room and explained to her mother and sister, only one of them can stay per policy. Pt's family upset and would like to talk to someome else, charge nurse notified.

## 2020-07-12 NOTE — Plan of Care (Signed)
  Problem: Education: Goal: Knowledge of Childbirth will improve Outcome: Progressing Goal: Ability to make informed decisions regarding treatment and plan of care will improve Outcome: Progressing Goal: Ability to state and carry out methods to decrease the pain will improve Outcome: Progressing   Problem: Life Cycle: Goal: Ability to make normal progression through stages of labor will improve Outcome: Progressing

## 2020-07-13 ENCOUNTER — Inpatient Hospital Stay (HOSPITAL_COMMUNITY): Payer: Medicaid Other | Admitting: Anesthesiology

## 2020-07-13 ENCOUNTER — Encounter (HOSPITAL_COMMUNITY): Payer: Self-pay | Admitting: Obstetrics and Gynecology

## 2020-07-13 ENCOUNTER — Encounter (HOSPITAL_COMMUNITY)
Admission: AD | Disposition: A | Discharge status: Home / Self Care | Payer: Self-pay | Source: Home / Self Care | Attending: Obstetrics & Gynecology

## 2020-07-13 DIAGNOSIS — Z3A37 37 weeks gestation of pregnancy: Secondary | ICD-10-CM

## 2020-07-13 HISTORY — PX: PERINEAL LACERATION REPAIR: SHX5389

## 2020-07-13 LAB — CBC
HCT: 35 % — ABNORMAL LOW (ref 36.0–46.0)
Hemoglobin: 10.5 g/dL — ABNORMAL LOW (ref 12.0–15.0)
MCH: 26.6 pg (ref 26.0–34.0)
MCHC: 30 g/dL (ref 30.0–36.0)
MCV: 88.8 fL (ref 80.0–100.0)
Platelets: 212 K/uL (ref 150–400)
RBC: 3.94 MIL/uL (ref 3.87–5.11)
RDW: 15.8 % — ABNORMAL HIGH (ref 11.5–15.5)
WBC: 11 K/uL — ABNORMAL HIGH (ref 4.0–10.5)
nRBC: 0 % (ref 0.0–0.2)

## 2020-07-13 LAB — GLUCOSE, CAPILLARY
Glucose-Capillary: 81 mg/dL (ref 70–99)
Glucose-Capillary: 110 mg/dL — ABNORMAL HIGH (ref 70–99)
Glucose-Capillary: 104 mg/dL — ABNORMAL HIGH (ref 70–99)

## 2020-07-13 SURGERY — SUTURE REPAIR, LACERATION, PERINEUM
Anesthesia: Monitor Anesthesia Care

## 2020-07-13 MED ORDER — ONDANSETRON HCL 4 MG/2ML IJ SOLN: 4.0000 mg | INTRAMUSCULAR | Status: DC | PRN

## 2020-07-13 MED ORDER — LACTATED RINGERS IV SOLN: INTRAVENOUS | Status: DC | PRN

## 2020-07-13 MED ORDER — PROPOFOL 500 MG/50ML IV EMUL
INTRAVENOUS | Status: AC
  Filled 2020-07-13: qty 50

## 2020-07-13 MED ORDER — LIDOCAINE-EPINEPHRINE (PF) 2 %-1:200000 IJ SOLN
INTRAMUSCULAR | Status: DC | PRN
  Administered 2020-07-13: 5 mL via EPIDURAL

## 2020-07-13 MED ORDER — PRENATAL MULTIVITAMIN CH
1.0000 | ORAL_TABLET | Freq: Every day | ORAL | Status: DC
  Administered 2020-07-14 – 2020-07-15 (×2): 1 via ORAL
  Filled 2020-07-13 (×2): qty 1

## 2020-07-13 MED ORDER — FENTANYL CITRATE (PF) 100 MCG/2ML IJ SOLN
INTRAMUSCULAR | Status: DC | PRN
Start: 2020-07-13 — End: 2020-07-13
  Administered 2020-07-13: 100 ug via EPIDURAL

## 2020-07-13 MED ORDER — ONDANSETRON HCL 4 MG PO TABS: 4.0000 mg | ORAL_TABLET | ORAL | Status: DC | PRN

## 2020-07-13 MED ORDER — AMLODIPINE BESYLATE 5 MG PO TABS: 5.0000 mg | ORAL_TABLET | Freq: Every day | ORAL | Status: DC

## 2020-07-13 MED ORDER — MIDAZOLAM HCL 2 MG/2ML IJ SOLN
INTRAMUSCULAR | Status: AC
  Filled 2020-07-13: qty 2

## 2020-07-13 MED ORDER — FENTANYL CITRATE (PF) 100 MCG/2ML IJ SOLN
INTRAMUSCULAR | Status: DC | PRN
  Administered 2020-07-13: 100 ug via EPIDURAL

## 2020-07-13 MED ORDER — LACTATED RINGERS IV SOLN
500.0000 mL | Freq: Once | INTRAVENOUS | Status: AC
  Administered 2020-07-13: 500 mL via INTRAVENOUS

## 2020-07-13 MED ORDER — PHENYLEPHRINE 40 MCG/ML (10ML) SYRINGE FOR IV PUSH (FOR BLOOD PRESSURE SUPPORT): 80.0000 ug | PREFILLED_SYRINGE | INTRAVENOUS | Status: DC | PRN

## 2020-07-13 MED ORDER — WITCH HAZEL-GLYCERIN EX PADS: 1.0000 "application " | MEDICATED_PAD | CUTANEOUS | Status: DC | PRN

## 2020-07-13 MED ORDER — ONDANSETRON HCL 4 MG/2ML IJ SOLN
INTRAMUSCULAR | Status: DC | PRN
  Administered 2020-07-13: 4 mg via INTRAVENOUS

## 2020-07-13 MED ORDER — FENTANYL CITRATE (PF) 100 MCG/2ML IJ SOLN
100.0000 ug | INTRAMUSCULAR | Status: DC | PRN
  Administered 2020-07-13: 100 ug via INTRAVENOUS
  Administered 2020-07-13: 50 ug via INTRAVENOUS
  Filled 2020-07-13 (×3): qty 2

## 2020-07-13 MED ORDER — DIPHENHYDRAMINE HCL 50 MG/ML IJ SOLN: 12.5000 mg | INTRAMUSCULAR | Status: DC | PRN

## 2020-07-13 MED ORDER — FENTANYL CITRATE (PF) 100 MCG/2ML IJ SOLN
INTRAMUSCULAR | Status: AC
  Filled 2020-07-13: qty 2

## 2020-07-13 MED ORDER — KETOROLAC TROMETHAMINE 30 MG/ML IJ SOLN
INTRAMUSCULAR | Status: AC
  Filled 2020-07-13: qty 1

## 2020-07-13 MED ORDER — COCONUT OIL OIL: 1.0000 "application " | TOPICAL_OIL | Status: DC | PRN

## 2020-07-13 MED ORDER — TETANUS-DIPHTH-ACELL PERTUSSIS 5-2.5-18.5 LF-MCG/0.5 IM SUSP: 0.5000 mL | Freq: Once | INTRAMUSCULAR | Status: DC

## 2020-07-13 MED ORDER — BENZOCAINE-MENTHOL 20-0.5 % EX AERO
1.0000 "application " | INHALATION_SPRAY | CUTANEOUS | Status: DC | PRN
  Administered 2020-07-14: 1 via TOPICAL
  Filled 2020-07-13: qty 56

## 2020-07-13 MED ORDER — ACETAMINOPHEN 325 MG PO TABS
650.0000 mg | ORAL_TABLET | Freq: Four times a day (QID) | ORAL | Status: DC
  Administered 2020-07-14 – 2020-07-15 (×7): 650 mg via ORAL
  Filled 2020-07-13 (×7): qty 2

## 2020-07-13 MED ORDER — DEXMEDETOMIDINE (PRECEDEX) IN NS 20 MCG/5ML (4 MCG/ML) IV SYRINGE
PREFILLED_SYRINGE | INTRAVENOUS | Status: DC | PRN
  Administered 2020-07-13 (×2): 4 ug via INTRAVENOUS
  Administered 2020-07-13 (×2): 12 ug via INTRAVENOUS

## 2020-07-13 MED ORDER — LACTATED RINGERS AMNIOINFUSION: INTRAVENOUS | Status: DC

## 2020-07-13 MED ORDER — DIBUCAINE (PERIANAL) 1 % EX OINT: 1.0000 "application " | TOPICAL_OINTMENT | CUTANEOUS | Status: DC | PRN

## 2020-07-13 MED ORDER — PHENYLEPHRINE 40 MCG/ML (10ML) SYRINGE FOR IV PUSH (FOR BLOOD PRESSURE SUPPORT)
80.0000 ug | PREFILLED_SYRINGE | INTRAVENOUS | Status: DC | PRN
  Filled 2020-07-13: qty 10

## 2020-07-13 MED ORDER — SODIUM CHLORIDE (PF) 0.9 % IJ SOLN
INTRAMUSCULAR | Status: DC | PRN
Start: 2020-07-13 — End: 2020-07-13
  Administered 2020-07-13: 12 mL/h via EPIDURAL

## 2020-07-13 MED ORDER — IBUPROFEN 600 MG PO TABS
600.0000 mg | ORAL_TABLET | Freq: Four times a day (QID) | ORAL | Status: DC
  Administered 2020-07-14 – 2020-07-15 (×7): 600 mg via ORAL
  Filled 2020-07-13 (×7): qty 1

## 2020-07-13 MED ORDER — LIDOCAINE HCL (PF) 1 % IJ SOLN
INTRAMUSCULAR | Status: DC | PRN
  Administered 2020-07-13 (×3): 5 mL via EPIDURAL

## 2020-07-13 MED ORDER — SIMETHICONE 80 MG PO CHEW: 80.0000 mg | CHEWABLE_TABLET | ORAL | Status: DC | PRN

## 2020-07-13 MED ORDER — KETAMINE HCL 50 MG/5ML IJ SOSY
PREFILLED_SYRINGE | INTRAMUSCULAR | Status: AC
  Filled 2020-07-13: qty 5

## 2020-07-13 MED ORDER — DIPHENHYDRAMINE HCL 25 MG PO CAPS: 25.0000 mg | ORAL_CAPSULE | Freq: Four times a day (QID) | ORAL | Status: DC | PRN

## 2020-07-13 MED ORDER — FENTANYL-BUPIVACAINE-NACL 0.5-0.125-0.9 MG/250ML-% EP SOLN
12.0000 mL/h | EPIDURAL | Status: DC | PRN
  Filled 2020-07-13: qty 250

## 2020-07-13 MED ORDER — MIDAZOLAM HCL 5 MG/5ML IJ SOLN
INTRAMUSCULAR | Status: DC | PRN
  Administered 2020-07-13 (×2): 2 mg via INTRAVENOUS

## 2020-07-13 MED ORDER — EPHEDRINE 5 MG/ML INJ: 10.0000 mg | INTRAVENOUS | Status: DC | PRN

## 2020-07-13 MED ORDER — KETOROLAC TROMETHAMINE 30 MG/ML IJ SOLN
30.0000 mg | Freq: Once | INTRAMUSCULAR | Status: AC | PRN
  Administered 2020-07-13: 30 mg via INTRAVENOUS

## 2020-07-13 MED ORDER — ACETAMINOPHEN 10 MG/ML IV SOLN: 1000.0000 mg | Freq: Once | INTRAVENOUS | Status: DC | PRN

## 2020-07-13 MED ORDER — SENNOSIDES-DOCUSATE SODIUM 8.6-50 MG PO TABS
2.0000 | ORAL_TABLET | ORAL | Status: DC
  Administered 2020-07-14 (×2): 2 via ORAL
  Filled 2020-07-13 (×2): qty 2

## 2020-07-13 MED ORDER — SODIUM BICARBONATE 8.4 % IV SOLN
INTRAVENOUS | Status: DC | PRN
  Administered 2020-07-13: 3 mL via EPIDURAL
  Administered 2020-07-13: 5 mL via EPIDURAL
  Administered 2020-07-13: 10 mL via EPIDURAL

## 2020-07-13 MED ORDER — KETAMINE HCL 10 MG/ML IJ SOLN
INTRAMUSCULAR | Status: DC | PRN
  Administered 2020-07-13: 10 mg via INTRAVENOUS

## 2020-07-13 NOTE — Anesthesia Procedure Notes (Signed)
Epidural Patient location during procedure: OB Start time: 07/13/2020 3:45 AM End time: 07/13/2020 3:55 AM  Staffing Anesthesiologist: Leilani Able, MD Performed: anesthesiologist   Preanesthetic Checklist Completed: patient identified, IV checked, site marked, risks and benefits discussed, surgical consent, monitors and equipment checked, pre-op evaluation and timeout performed  Epidural Patient position: sitting Prep: DuraPrep and site prepped and draped Patient monitoring: continuous pulse ox and blood pressure Approach: midline Location: L3-L4 Injection technique: LOR air  Needle:  Needle type: Tuohy  Needle gauge: 17 G Needle length: 9 cm and 9 Needle insertion depth: 9 cm Catheter type: closed end flexible Catheter size: 19 Gauge Catheter at skin depth: 15 cm Test dose: negative and Other  Assessment Events: blood not aspirated, injection not painful, no injection resistance, no paresthesia and negative IV test  Additional Notes Reason for block:procedure for pain

## 2020-07-13 NOTE — Anesthesia Preprocedure Evaluation (Signed)
Anesthesia Evaluation  Patient identified by MRN, date of birth, ID band Patient awake    Reviewed: Allergy & Precautions, NPO status , Patient's Chart, lab work & pertinent test results, reviewed documented beta blocker date and time   Airway Mallampati: III       Dental no notable dental hx.    Pulmonary Current Smoker,    Pulmonary exam normal        Cardiovascular hypertension, Pt. on home beta blockers Normal cardiovascular exam     Neuro/Psych negative neurological ROS  negative psych ROS   GI/Hepatic negative GI ROS, Neg liver ROS,   Endo/Other  diabetes, GestationalMorbid obesity  Renal/GU negative Renal ROS  negative genitourinary   Musculoskeletal negative musculoskeletal ROS (+)   Abdominal (+) + obese,   Peds  Hematology  (+) Blood dyscrasia, anemia ,   Anesthesia Other Findings   Reproductive/Obstetrics                             Anesthesia Physical Anesthesia Plan  ASA: III  Anesthesia Plan: Epidural   Post-op Pain Management:    Induction:   PONV Risk Score and Plan:   Airway Management Planned:   Additional Equipment: None  Intra-op Plan:   Post-operative Plan:   Informed Consent: I have reviewed the patients History and Physical, chart, labs and discussed the procedure including the risks, benefits and alternatives for the proposed anesthesia with the patient or authorized representative who has indicated his/her understanding and acceptance.       Plan Discussed with:   Anesthesia Plan Comments:         Anesthesia Quick Evaluation

## 2020-07-13 NOTE — Progress Notes (Signed)
OR called for pt.  I called Dr Armond Hang to let her know the OR was ready for pt.  Dr Armond Hang  Stated that she is about to do an epidural and will talke to [facility] after she finishes with epidural

## 2020-07-13 NOTE — Anesthesia Preprocedure Evaluation (Signed)
Anesthesia Evaluation  Patient identified by MRN, date of birth, ID band Patient awake    Reviewed: Allergy & Precautions, NPO status , Patient's Chart, lab work & pertinent test results, reviewed documented beta blocker date and time   Airway Mallampati: III  TM Distance: >3 FB Neck ROM: Full    Dental no notable dental hx. (+) Teeth Intact, Dental Advisory Given   Pulmonary Current SmokerPatient did not abstain from smoking.,    Pulmonary exam normal breath sounds clear to auscultation       Cardiovascular hypertension, Pt. on home beta blockers Normal cardiovascular exam Rhythm:Regular Rate:Normal     Neuro/Psych negative neurological ROS  negative psych ROS   GI/Hepatic negative GI ROS, Neg liver ROS,   Endo/Other  diabetes, GestationalMorbid obesity (BMI 65)  Renal/GU negative Renal ROS  negative genitourinary   Musculoskeletal negative musculoskeletal ROS (+)   Abdominal (+) + obese,   Peds  Hematology  (+) Blood dyscrasia, anemia ,   Anesthesia Other Findings SVD at 1148 today now with a second degree laceration unable to be repaired on L&D 2/2 inadequate exposure  Reproductive/Obstetrics                            Anesthesia Physical  Anesthesia Plan  ASA: III  Anesthesia Plan: Epidural and MAC   Post-op Pain Management:    Induction:   PONV Risk Score and Plan: Treatment may vary due to age or medical condition  Airway Management Planned: Natural Airway  Additional Equipment: None  Intra-op Plan:   Post-operative Plan:   Informed Consent: I have reviewed the patients History and Physical, chart, labs and discussed the procedure including the risks, benefits and alternatives for the proposed anesthesia with the patient or authorized representative who has indicated his/her understanding and acceptance.       Plan Discussed with: Anesthesiologist  Anesthesia Plan  Comments: (Patient identified. Risks, benefits, options discussed with patient including but not limited to bleeding, infection, nerve damage, paralysis, failed block, incomplete pain control, headache, blood pressure changes, nausea, vomiting, reactions to medication, itching, and post partum back pain. Confirmed with bedside nurse the patient's most recent platelet count. Confirmed with the patient that they are not taking any anticoagulation, have any bleeding history or any family history of bleeding disorders. Patient expressed understanding and wishes to proceed. All questions were answered. )       Anesthesia Quick Evaluation

## 2020-07-13 NOTE — Progress Notes (Addendum)
Labor Progress Note  Subjective:  Patient thrashing around in bed complaining of pain. Asking to start pushing. Discussed expectations and pain management.   Objective:  BP (!) 147/102   Pulse (!) 119   Temp 97.8 F (36.6 C) (Oral)   Resp 20   Ht 5\' 6"  (1.676 m)   Wt (!) 181.4 kg   LMP 10/23/2019 (Exact Date)   SpO2 94%   BMI 64.56 kg/m   Dilation: 5 Effacement (%): 90 Station: 0 Presentation: Vertex Exam by:: S [email or web address] RN Contractions: every 1-3 minutes FH: Baseline 140, moderate variability, accels present, intermittent variable decels occasionally with contractions, quick return to baseline  Assessment and Plan:  Laura Gutierrez is a 34 y.o. G1P0 at [redacted]w[redacted]d admitted for IOL CHTN and A1GDM.  Labor:  S/p Cook catheter and Cytotec x 2. Continue pitocin. IUPC/FSE in place. . Pain control: epidural  CHTN  Continue home 600 mg Labetalol TID  PRN orders for severe pressures  Monitor pressures closely   PreE labs unremarkable  A1GDM  Q4 hour CBG in latent labor, Q2 hour in active labor   Fetal Wellbeing: Category II, but overall reassuring . GBS Negative/-- (10/04 1057)  . Continuous fetal monitoring  04-05-2005, MD

## 2020-07-13 NOTE — Discharge Instructions (Signed)

## 2020-07-13 NOTE — Progress Notes (Signed)
Labor Progress Note  Subjective:  Much more comfortable with epidural. Was snoring when I entered the room. Woke up some with each contraction to report pressure, but fell asleep immediately after.   Objective:  BP (!) 145/70   Pulse 87   Temp 98.1 F (36.7 C) (Oral)   Resp 16   Ht _0  (1.676 m)   Wt (!) 181.4 kg   LMP 10/23/2019 (Exact Date)   SpO2 94%   BMI 64.56 kg/m  Gen: Moderate distress during contractions.  Extremities: No signs of DVT.   CE: Dilation: 5 Effacement (%): 90 Station: 0 Presentation: Vertex Exam by:: Dr Astrid Drafts Contractions: every 1-3 minutes FH: Baseline 150, moderate variability, accels present, decels absent  Assessment and Plan:  Laura Gutierrez is a 34 y.o. G1P0 at 26w4dadmitted for IOL CHTN   Labor:  Cytotec x 2. Placed Cooks 1500. Pitocin 2115.   .Marland KitchenPain control: epidural . Anticipated MOD: NSVD PPH Risk: medium. maternal BMI > 40. Type & screen obtained.   CHTN  Continue home 600 mg Labetalol TID  PRN orders for severe pressures  Monitor pressures closely   CMP: alk phos 145  UPC 0.11  A1GDM 91>83>101>88  Q4 hour CBG in latent labor, Q2 hour in active labor   Fetal Wellbeing: Category I . GBS Negative/-- (10/04 1057)  . Continuous fetal monitoring  CEzequiel Essex MD

## 2020-07-13 NOTE — Op Note (Signed)
[name] PROCEDURE DATE: 11/15/2018  PREOPERATIVE DIAGNOSES:  Second degree perineal laceration after vaginal delivery, BMI 65, unable to get adequate exposure in delivery room, inadequate pain control after delivery POSTOPERATIVE DIAGNOSES: The same PROCEDURE: Repair of perineal laceration under sedation SURGEON:  Dr. Jaynie Collins ANESTHESIOLOGIST: Dr. Roxan Hockey  INDICATIONS: 34 y.o. G1P1 s/p vaginal delivery complicated second degree laceration. Unable to fully visualize extent of laceration in delivery room, and patient did not have adequate anesthesia on board. Her habitus also made exposure difficult. After two unsuccessful tries in the room, the decision was made to proceed with repair in the OR.  Risks of surgery were discussed with the patient including but not limited to: bleeding; infection which may require antibiotics; injury to surrounding organs; need for additional procedures; flatal or fecal incontinence issues; fistula development and other postoperative/anesthesia complications. Written informed consent was obtained.    FINDINGS:  Second degree laceration, measuring about 3 cm deep from introitus and involving about 70% of perineal body.  Anal sphincter and rectal mucosa intact.    ANESTHESIA:   Monitored anesthesia care ESTIMATED BLOOD LOSS: 25 ml COMPLICATIONS: None immediate  PROCEDURE IN DETAIL:  The patient had sequential compression devices applied to her lower extremities while in the preoperative area.  She was then taken to the operating room where monitored anesthesia care was administered and was found to be adequate.  She was placed in the dorsal lithotomy position, and was prepped and draped in a sterile manner.  Traxi retractors were placed on her abdomen and bilateral inner thighs to help with retraction.  After an adequate timeout was performed, attention was turned to the vagina. The lacerated vaginal tissue was then reapproximated with a 2-0 Vicryl  running locked suture; this suture was also used to reapproximate the rest of the perineal body in a running stitch and to do a subcuticular closure of the skin of the perineum. Of note, a separate 0 Vicryl stitch was used for a crown stitch.  All instruments were removed from the patient's vagina.  Sponge, needle and instrument counts were correct times two.  The patient tolerated the procedure well and was taken to the recovery area awake and in stable condition.    Jaynie Collins, MD, FACOG Obstetrician & Gynecologist, [facility] for [employer], [facility]

## 2020-07-13 NOTE — Progress Notes (Signed)
Labor Progress Note  Subjective:  Feeling quite uncomfortable. Feeling regular contractions, described as severe. Breathing through them. Still no pain meds, let her know she has pain control options when she wants them.    Objective:  BP (!) 153/97   Pulse 95   Temp 98.1 F (36.7 C) (Oral)   Resp 16   Ht _0  (1.676 m)   Wt (!) 181.4 kg   LMP 10/23/2019 (Exact Date)   SpO2 100%   BMI 64.56 kg/m  Gen: Moderate distress during contractions.  Extremities: No signs of DVT.   CE: Dilation: 3 Effacement (%): Thick Station: Ballotable Presentation: Vertex Exam by:: Dr. Astrid Drafts Contractions: irregular FH: Baseline 150, moderate variability, accels present, one early decel  Assessment and Plan:  Laura Gutierrez is a 34 y.o. G1P0 at 32w4dadmitted for IOL CHTN   Labor:  Cytotec x 2. Placed Cooks 1500. Pitocin 2115.   .Marland KitchenPain control: IV pain meds . Anticipated MOD: NSVD PPH Risk: medium. maternal BMI > 40. Type & screen obtained.   CHTN  Continue home 600 mg Labetalol TID  PRN orders for severe pressures  Monitor pressures closely   CMP: alk phos 145  UPC 0.11  A1GDM 91>83>101>88  Q4 hour CBG in latent labor, Q2 hour in active labor   Fetal Wellbeing: Category I . GBS Negative/-- (10/04 1057)  . Continuous fetal monitoring  CEzequiel Essex MD

## 2020-07-13 NOTE — Progress Notes (Signed)
Duplicate vitals

## 2020-07-13 NOTE — Progress Notes (Signed)
RN has been in room constantly since 0715 [phone]

## 2020-07-13 NOTE — Discharge Summary (Signed)
Postpartum Discharge Summary     Patient Name: Laura Gutierrez DOB: 1986-01-15 MRN: [id number]  Date of admission: 07/12/2020 Delivery date:07/13/2020  Delivering provider: Arrie Senate  Date of discharge: 07/15/2020  Admitting diagnosis: Encounter for induction of labor [Z34.90] Intrauterine pregnancy: [redacted]w[redacted]d    Secondary diagnosis:  Active Problems:   Hypertension   Obesity with serious comorbidity   Supervision of high risk pregnancy, antepartum   Chronic pre-existing hypertension during pregnancy   Obesity affecting pregnancy, antepartum   Gestational diabetes   COVID-19 affecting pregnancy in third trimester   Encounter for induction of labor   Vaginal delivery   Second degree perineal laceration  Additional problems: none    Discharge diagnosis: Term Pregnancy Delivered, CHTN and GDM A1                                              Post partum procedures:none Augmentation: AROM, Pitocin, Cytotec and IP Foley Complications: None  Hospital course: Induction of Labor With Vaginal Delivery   34y.o. yo G1P0 at 356w5das admitted to the hospital 07/12/2020 for induction of labor.  Indication for induction: A1 DM.  Patient had an uncomplicated labor course as follows: Membrane Rupture Time/Date:  ,   Delivery Method:Vaginal, Spontaneous  Episiotomy: None  Lacerations:  2nd degree  Details of delivery can be found in separate delivery note.  Patient had a routine postpartum course. Patient is discharged home 07/15/20.  Newborn Data: Birth date:07/13/2020  Birth time:11:48 AM  Gender:Female  Living status:Living  Apgars:7 ,8  Weight:2900 g   Magnesium Sulfate received: No BMZ received: No Rhophylac:N/A MMR:N/A T-DaP:Given prenatally Flu: No Transfusion:No  Physical exam  Vitals:   07/14/20 0536 07/14/20 1442 07/14/20 2100 07/15/20 0407  BP: 121/75 (!) 143/91 (!) 141/90 (!) 147/90  Pulse: (!) 101 (!) 102  91  Resp: 18 17 18 18   Temp: 98.5 F (36.9 C)  97.9 F (36.6 C) 98.1 F (36.7 C) 98.6 F (37 C)  TempSrc:  Oral Oral Oral  SpO2: 100% 100% 100% 100%  Weight:      Height:       General: alert, cooperative and no distress Lochia: appropriate Uterine Fundus: firm Incision: N/A DVT Evaluation: No evidence of DVT seen on physical exam. No cords or calf tenderness. Calf/Ankle edema is present Labs: Lab Results  Component Value Date   WBC 11.0 (H) 07/13/2020   HGB 10.5 (L) 07/13/2020   HCT 35.0 (L) 07/13/2020   MCV 88.8 07/13/2020   PLT 212 07/13/2020   CMP Latest Ref Rng & Units 07/12/2020  Glucose 70 - 99 mg/dL 86  BUN 6 - 20 mg/dL 6  Creatinine 0.44 - 1.00 mg/dL 0.77  Sodium 135 - 145 mmol/L 136  Potassium 3.5 - 5.1 mmol/L 3.8  Chloride 98 - 111 mmol/L 103  CO2 22 - 32 mmol/L 25  Calcium 8.9 - 10.3 mg/dL 8.8(L)  Total Protein 6.5 - 8.1 g/dL 6.1(L)  Total Bilirubin 0.3 - 1.2 mg/dL 0.2(L)  Alkaline Phos 38 - 126 U/L 145(H)  AST 15 - 41 U/L 21  ALT 0 - 44 U/L 24   Edinburgh Score: No flowsheet data found.   After visit meds:  Allergies as of 07/15/2020      Reactions   Bactrim [sulfamethoxazole-trimethoprim] Anaphylaxis   Latex    itching  Medication List    STOP taking these medications   Accu-Chek Guide test strip Generic drug: glucose blood   Accu-Chek Guide w/Device Kit   Accu-Chek Softclix Lancets lancets   Blood Pressure Kit Devi   labetalol 300 MG tablet Commonly known as: NORMODYNE     TAKE these medications   albuterol 108 (90 Base) MCG/ACT inhaler Commonly known as: VENTOLIN HFA Inhale 2 puffs into the lungs every 6 (six) hours as needed for wheezing or shortness of breath.   amLODipine 5 MG tablet Commonly known as: NORVASC Take 1 tablet (5 mg total) by mouth daily.   aspirin EC 81 MG tablet Take 1 tablet (81 mg total) by mouth daily. Take after 12 weeks for prevention of preeclampsia later in pregnancy   famotidine 10 MG tablet Commonly known as: PEPCID Take 10 mg by mouth  2 (two) times daily.   ibuprofen 600 MG tablet Commonly known as: ADVIL Take 1 tablet (600 mg total) by mouth every 6 (six) hours as needed for moderate pain or cramping.   TYLENOL COLD/FLU DAY PO Take 2 capsules by mouth.   Vitafol Ultra 29-0.6-0.4-200 MG Caps Take 1 capsule by mouth daily before breakfast.        Discharge home in stable condition Infant Feeding: Bottle and Breast Infant Disposition:home with mother Discharge instruction: per After Visit Summary and Postpartum booklet. Activity: Advance as tolerated. Pelvic rest for 6 weeks.  Diet: routine diet Future Appointments: Future Appointments  Date Time Provider [place]  07/21/2020 10:40 AM [place] None  08/25/2020  8:45 AM [facility] None  08/25/2020  9:00 AM Constant, Peggy, MD [place] None   Follow up Visit:  [place]. Schedule an appointment as soon as possible for a visit.   Specialty: Obstetrics and Gynecology Why: Make appointment to be seen in the office for blood pressure check in 1 week and appointment for postpartum care in 4 weeks  Contact information: [address], [place] [phone]               Please schedule this patient for a In person postpartum visit in 4 weeks with the following provider: Any provider. Additional Postpartum F/U:2 hour GTT and BP check 1 week  High risk pregnancy complicated by: GDM and HTN Delivery mode:  Vaginal, Spontaneous  Anticipated Birth Control:  Declined   07/15/2020 Lajean Manes, CNM

## 2020-07-13 NOTE — Progress Notes (Signed)
Epidural redosed 

## 2020-07-13 NOTE — Progress Notes (Signed)
Dr. Macon Large called to bedside to assist in laceration repair after delivery. Patient unable to tolerate laceration repair in the room and difficulty with visualization of anatomy due to BMI. Despite epidural and PRN pain medication patient unable to tolerate retraction and repair. Decision made to take patient to OR for repair. Discussed risks/benefits with patient, patient amendable.  Alric Seton, MD OB Fellow, [facility], [facility] 07/13/2020 1:53 PM

## 2020-07-13 NOTE — Progress Notes (Signed)
CRNA redosing epidural

## 2020-07-13 NOTE — Transfer of Care (Signed)
Immediate Anesthesia Transfer of Care Note  Patient: Laura Gutierrez  Procedure(s) Performed: SUTURE REPAIR PERINEAL LACERATION (N/A )  Patient Location: PACU  Anesthesia Type:Epidural  Level of Consciousness: awake, alert  and oriented  Airway & Oxygen Therapy: Patient Spontanous Breathing and Patient connected to nasal cannula oxygen  Post-op Assessment: Report given to RN and Post -op Vital signs reviewed and stable  Post vital signs: Reviewed and stable  Last Vitals:  Vitals Value Taken Time  BP    Temp    Pulse 105 07/13/20 1808  Resp 29 07/13/20 1808  SpO2 100 % 07/13/20 1808  Vitals shown include unvalidated device data.  Last Pain:  Vitals:   07/13/20 0812  TempSrc: Oral  PainSc:          Complications: No complications documented.

## 2020-07-13 NOTE — Progress Notes (Signed)
Snoring between uc's awake and breathing through uc at peak of uc's

## 2020-07-13 NOTE — Anesthesia Postprocedure Evaluation (Signed)
Anesthesia Post Note  Patient: Laura Gutierrez  Procedure(s) Performed: SUTURE REPAIR PERINEAL LACERATION (N/A )     Patient location during evaluation: PACU Anesthesia Type: MAC and Epidural Level of consciousness: awake and alert Pain management: pain level controlled Vital Signs Assessment: post-procedure vital signs reviewed and stable Respiratory status: spontaneous breathing, nonlabored ventilation, respiratory function stable and patient connected to nasal cannula oxygen Cardiovascular status: stable and blood pressure returned to baseline Postop Assessment: no apparent nausea or vomiting, epidural receding, no headache and no backache Anesthetic complications: no   No complications documented.  Last Vitals:  Vitals:   07/13/20 1859 07/13/20 1901  BP:  123/77  Pulse:  (!) 106  Resp:  (!) 22  Temp:    SpO2: 100% 100%    Last Pain:  Vitals:   07/13/20 1849  TempSrc:   PainSc: 0-No pain   Pain Goal:    LLE Motor Response: Purposeful movement (07/13/20 1849) LLE Sensation: Full sensation (07/13/20 1849) RLE Motor Response: Purposeful movement (07/13/20 1849) RLE Sensation: Full sensation (07/13/20 1849)        [place]

## 2020-07-14 ENCOUNTER — Encounter (HOSPITAL_COMMUNITY): Payer: Self-pay | Admitting: Obstetrics and Gynecology

## 2020-07-14 LAB — T.PALLIDUM AB, TOTAL: T Pallidum Abs: REACTIVE — AB

## 2020-07-14 MED ORDER — AMLODIPINE BESYLATE 5 MG PO TABS
5.0000 mg | ORAL_TABLET | Freq: Every day | ORAL | Status: DC
  Administered 2020-07-14 – 2020-07-15 (×2): 5 mg via ORAL
  Filled 2020-07-14 (×2): qty 1

## 2020-07-14 NOTE — Anesthesia Postprocedure Evaluation (Signed)
Anesthesia Post Note  Patient: Laura Gutierrez  Procedure(s) Performed: AN AD HOC LABOR EPIDURAL     Patient location during evaluation: Mother Baby Anesthesia Type: Epidural Level of consciousness: awake and alert Pain management: pain level controlled Vital Signs Assessment: post-procedure vital signs reviewed and stable Respiratory status: spontaneous breathing, nonlabored ventilation and respiratory function stable Cardiovascular status: stable Postop Assessment: no headache, no backache and epidural receding Anesthetic complications: no   No complications documented.  Last Vitals:  Vitals:   07/14/20 0042 07/14/20 0536  BP: (!) 109/54 121/75  Pulse: (!) 105 (!) 101  Resp: 18 18  Temp: 37.1 C 36.9 C  SpO2: 100% 100%    Last Pain:  Vitals:   07/14/20 0042  TempSrc: Oral  PainSc:    Pain Goal:                   [place] Thresher

## 2020-07-14 NOTE — Lactation Note (Addendum)
This note was copied from a baby's chart. Lactation Consultation Note  Patient Name: Laura Gutierrez [id number] Date: 07/14/2020 Reason for consult: Initial assessment;Difficult latch;1st time breastfeeding;Primapara;Mother's request;Early term 37-38.6wks;Infant weight loss;Maternal endocrine disorder;Other (Comment) Type of Endocrine Disorder?: Diabetes (GHTN) Morbid Obesity.   Infant is 37 weeks 24 hours old. Birthweight < 7 lbs. 2% weight loss. Mom has been giving bottles. She states infants latches well but does not suck. Mom had also given a pacifier which can affect the latch. LC told Mom to hold off on the pacifier for 4 weeks to establish a good latch.   Infant receiving Similac w Iron 3 feedings of 5 ml last feeding at 10 am for 20 ml. Infant had 4 urine and 3 stool since birth. Mom is using a slow flow nipple. LC went over with Mom how to pace bottle feed.  LC assisted Mom to latch infant in football on the left side. Infant able to latch but no signs of milk transfer noted even with stimulation. Infant just fed 1 hour prior with formula.    LC did breast massage and heat with drops of colostrum noted but not enough to fill a spoon. LC demonstrated with Mom how to collect drops of colostrum into snappies. DEBP set up and Mom is actively pumping at the end of the visit.   Plan 1. Feed infant based on cues 8-12x in 24 hour period. No more than 3 hours without an attempt. Mom to offer breast first looking for signs of milk transfer.           2. Mom to offer supplementation using breastfeeding guidelines based on hours after birth of formula.          3. Mom to pump q 3hours using DEBP for 15 minutes.           4. LC brochure of inpatient and outpatient services reviewed with Mom.

## 2020-07-14 NOTE — Progress Notes (Signed)
Post Partum Day # 1 IOL for CHTN and A1 DM Subjective: Pt without complaints this morning. Ambulating, voiding, tolerating diet and good oral pain control.  Objective: Blood pressure 121/75, pulse (!) 101, temperature 98.5 F (36.9 C), resp. rate 18, height 5\' 6"  (1.676 m), weight (!) 181.4 kg, last menstrual period 10/23/2019, SpO2 100 %, unknown if currently breastfeeding.  Physical Exam:  General: alert Lochia: appropriate Uterine Fundus: firm Incision: healing well DVT Evaluation: No evidence of DVT seen on physical exam.  Recent Labs    07/12/20 0626 07/13/20 0159  HGB 10.4* 10.5*  HCT 35.8* 35.0*    Assessment/Plan: Stable. Start Norvasc for BP was on HCTZ pre pregnancy, ? Dosage. CBG stable. Desires circ. Continue with progressive care. Plan discharge tomorrow.    Patient desires circumcision for her female infant.  Circumcision procedure details discussed, risks and benefits of procedure were also discussed.  These include but are not limited to: Benefits of circumcision in men include reduction in the rates of urinary tract infection (UTI), penile cancer, some sexually transmitted infections, penile inflammatory and retractile disorders, as well as easier hygiene.  Risks include bleeding , infection, injury of glans which may lead to penile deformity or urinary tract issues, unsatisfactory cosmetic appearance and other potential complications related to the procedure.  It was emphasized that this is an elective procedure.  Patient wants to proceed with circumcision; written informed consent obtained.  Will do circumcision soon, routine circumcision and post circumcision care ordered for the infant.      LOS: 2 days   09/12/20 07/14/2020, 9:55 AM

## 2020-07-15 ENCOUNTER — Encounter (HOSPITAL_COMMUNITY): Payer: Self-pay | Admitting: Obstetrics & Gynecology

## 2020-07-15 ENCOUNTER — Other Ambulatory Visit (HOSPITAL_COMMUNITY): Payer: Self-pay | Admitting: Certified Nurse Midwife

## 2020-07-15 MED ORDER — AMLODIPINE BESYLATE 5 MG PO TABS
5.0000 mg | ORAL_TABLET | Freq: Every day | ORAL | 2 refills | Status: DC
Start: 2020-07-15 — End: 2020-07-25

## 2020-07-15 MED ORDER — IBUPROFEN 600 MG PO TABS: 600.0000 mg | ORAL_TABLET | Freq: Four times a day (QID) | ORAL | 0 refills | Status: DC | PRN

## 2020-07-15 MED FILL — IBUPROFEN 600 MG TABLET: 600 | 8 days supply | Qty: 30 | Fill #0

## 2020-07-15 MED FILL — AMLODIPINE BESYLATE 5 MG TA: 5 | 30 days supply | Qty: 30 | Fill #0

## 2020-07-21 ENCOUNTER — Ambulatory Visit: Payer: Medicaid Other

## 2020-07-24 IMAGING — US US MFM OB DETAIL+14 WK
1 series · 12 of 28 positions shown · non-contrast
Comparison: none

[Series 1: us mfm ob detail+14 wk · 12 of 47 slices shown]
[im 2/47]
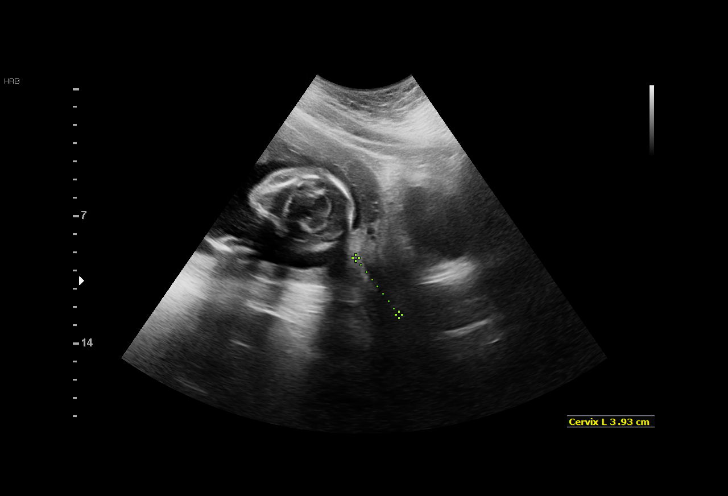
[im 6/47]
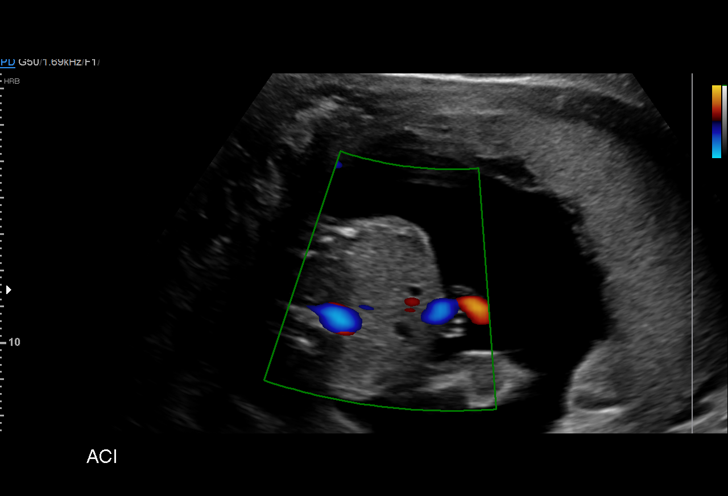
[im 9/47]
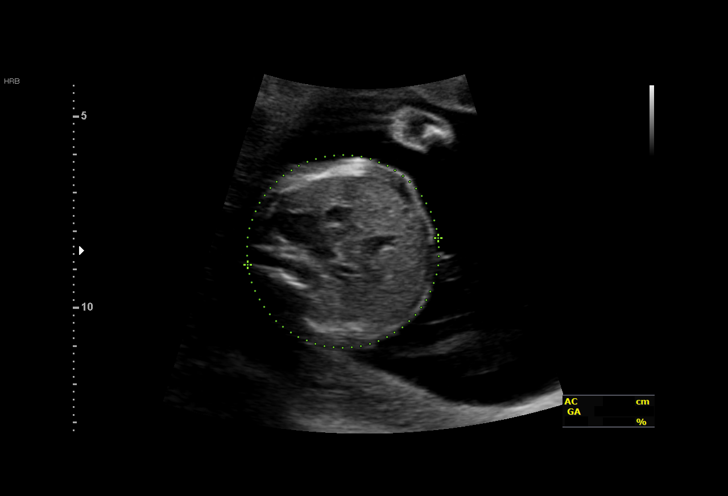
[im 14/47]
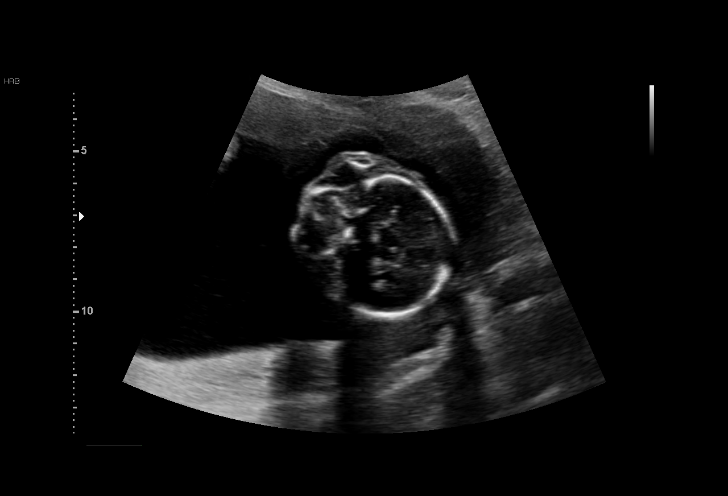
[im 18/47]
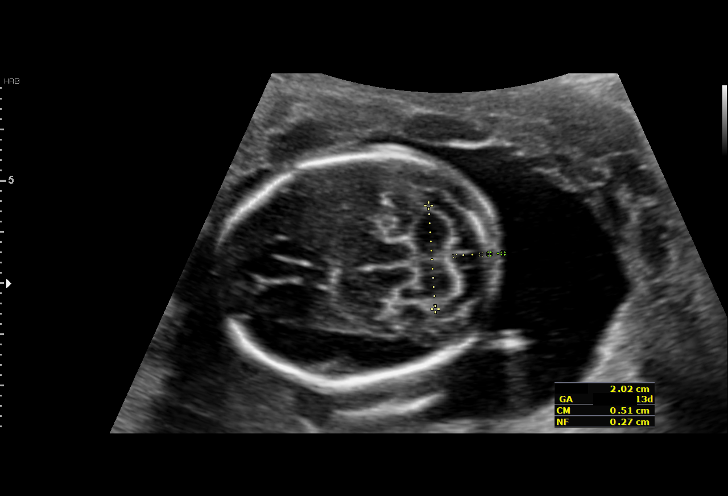
[im 21/47]
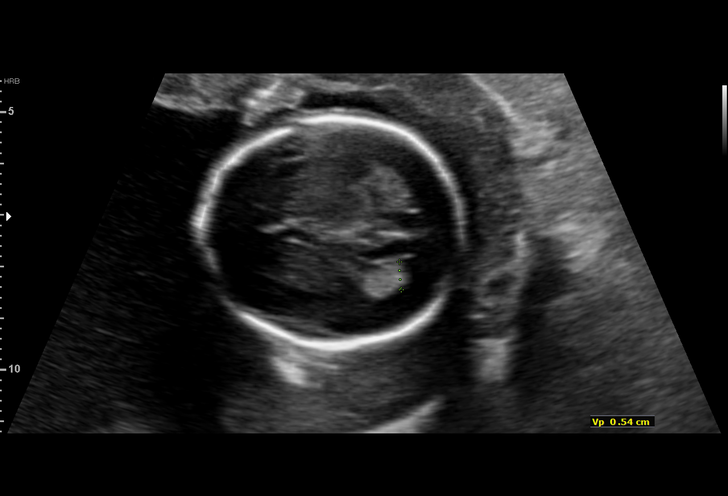
[im 26/47]
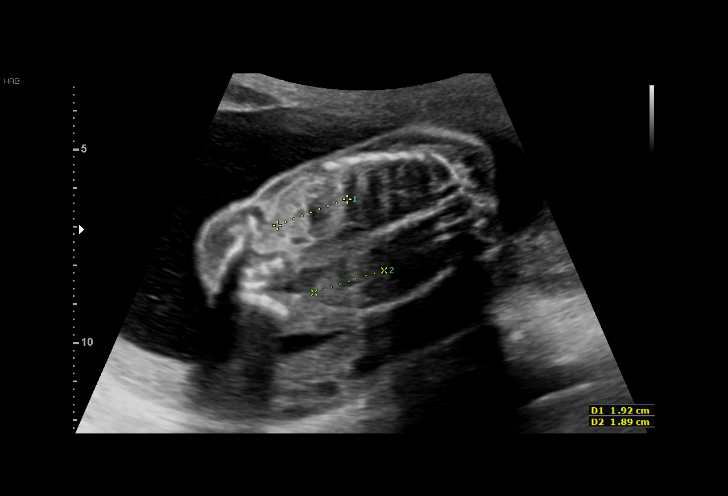
[im 29/47]
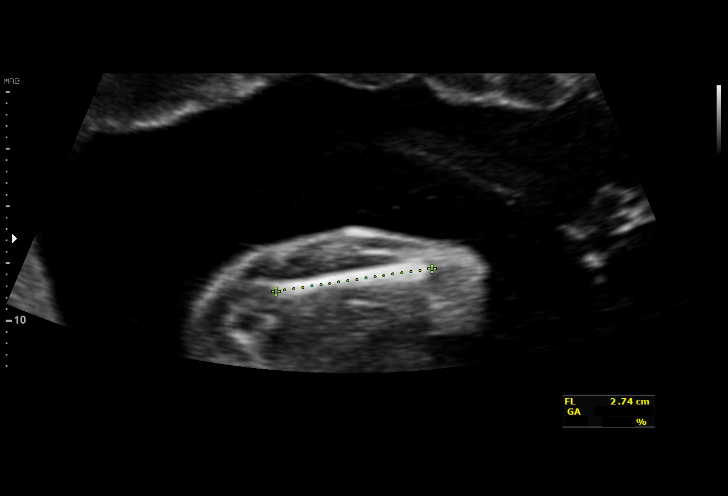
[im 33/47]
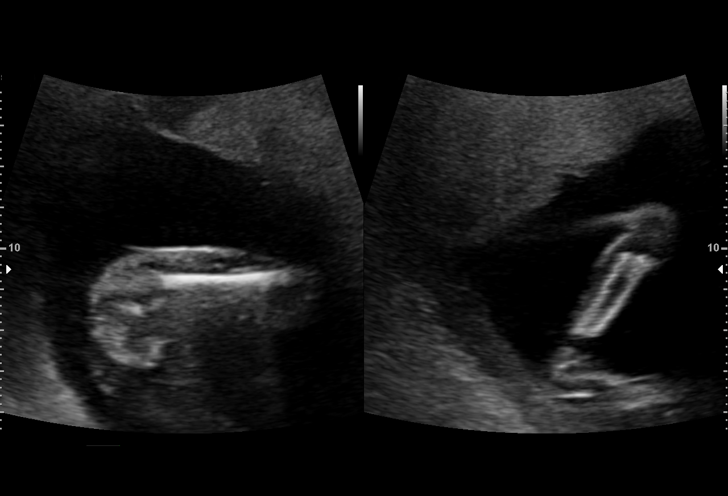
[im 38/47]
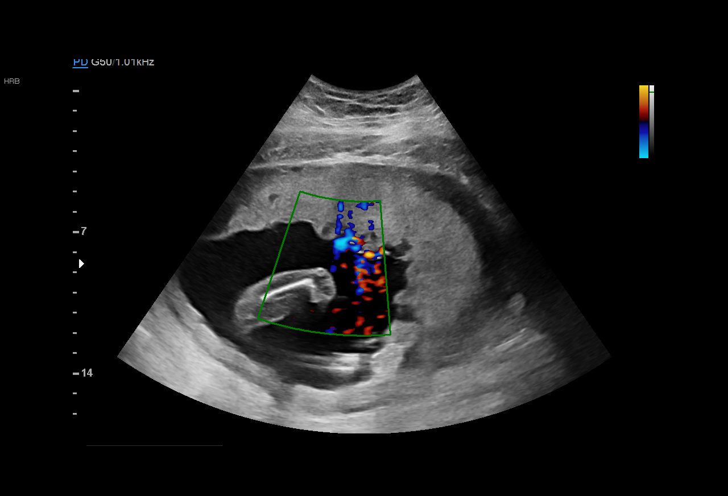
[im 41/47]
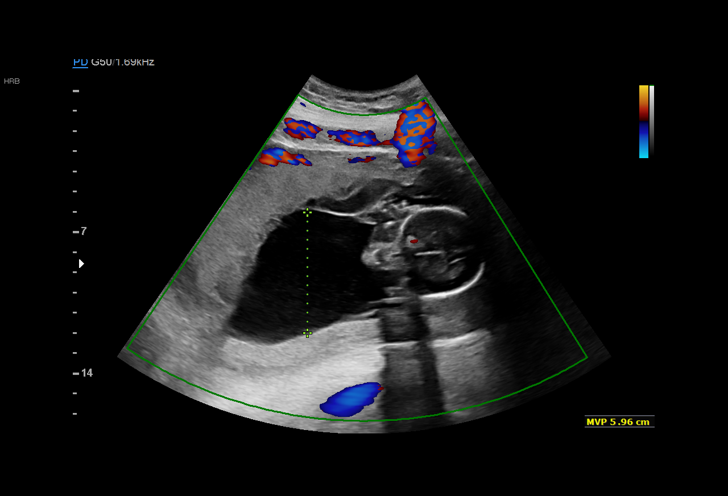
[im 45/47]
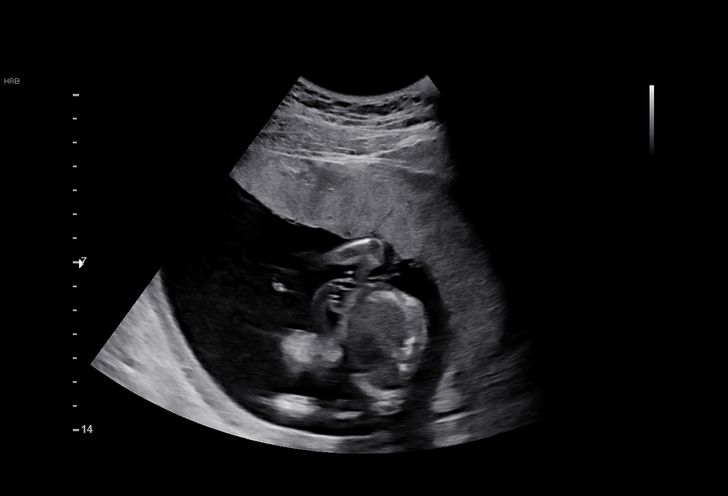

[12 of 28 positions shown; findings below may reference images not displayed]

Indications

 Hypertension - Chronic/Pre-existing
 (Labetalol, ASA)
 Maternal morbid obesity (BMI 61)
 Encounter for antenatal screening for
 malformations (low risk NIPS, neg AFP, neg
 Horizon)
 Tobacco use complicating pregnancy,
 second trimester
 19 weeks gestation of pregnancy
Vital Signs

 BMI:
Fetal Evaluation

 Num Of Fetuses:         1
 Fetal Heart Rate(bpm):  164
 Cardiac Activity:       Observed
 Presentation:           Transverse, head to maternal left
 Placenta:               Anterior
 P. Cord Insertion:      Visualized, central

 Amniotic Fluid
 AFI FV:      Within normal limits
Biometry

 BPD:      43.8  mm     G. Age:  19w 2d         62  %    CI:        78.74   %    70 - 86
                                                         FL/HC:      18.8   %    16.1 -
 HC:      156.1  mm     G. Age:  18w 4d         21  %    HC/AC:      1.00        1.09 -
 AC:      156.2  mm     G. Age:  20w 5d         93  %    FL/BPD:     66.9   %
 FL:       29.3  mm     G. Age:  19w 0d         44  %    FL/AC:      18.8   %    20 - 24
 CER:      20.2  mm     G. Age:  19w 1d         55  %
 NFT:       2.7  mm

 LV:        5.4  mm
 CM:        5.1  mm

 Est. FW:     314  gm    0 lb 11 oz      88  %
OB History

 Gravidity:    1
Gestational Age

 LMP:           19w 0d        Date:  10/23/19                 EDD:   07/29/20
 U/S Today:     19w 3d                                        EDD:   07/26/20
 Best:          19w 0d     Det. By:  LMP  (10/23/19)          EDD:   07/29/20
Anatomy

 Cranium:               Appears normal         Aortic Arch:            Not well visualized
 Cavum:                 Appears normal         Ductal Arch:            Not well visualized
 Ventricles:            Appears normal         Diaphragm:              Not well visualized
 Choroid Plexus:        Appears normal         Stomach:                Appears normal, left
                                                                       sided
 Cerebellum:            Appears normal         Abdomen:                Appears normal
 Posterior Fossa:       Appears normal         Abdominal Wall:         Appears nml (cord
                                                                       insert, abd wall)
 Nuchal Fold:           Appears normal         Cord Vessels:           Appears normal (3
                                                                       vessel cord)
 Face:                  Appears normal         Kidneys:                Appear normal
                        (orbits and profile)
 Lips:                  Appears normal         Bladder:                Appears normal
 Thoracic:              Appears normal         Spine:                  Not well visualized
 Heart:                 Not well visualized    Upper Extremities:      Appears normal
 RVOT:                  Not well visualized    Lower Extremities:      Appears normal
 LVOT:                  Not well visualized

 Other:  Technically difficult due to maternal habitus and fetal position.
Cervix Uterus Adnexa

 Cervix
 Length:           3.93  cm.
 Normal appearance by transabdominal scan.
Comments

 This patient was seen for a detailed fetal anatomy scan due
 to maternal obesity and history of chronic hypertension
 currently treated with labetalol 300 mg twice a day.  She is
 taking a daily baby aspirin for preeclampsia prophylaxis.  Her
 blood pressures in our office today were 149/92 and 156/97.
 She denies any other significant past medical history and
 denies any problems in her current pregnancy.
 She had a cell free DNA test earlier in her pregnancy which
 indicated a low risk for trisomy 21, 18, and 13.  The patient
 did not want the fetal gender revealed today.
 She was informed that the fetal growth and amniotic fluid
 level were appropriate for her gestational age.
 The views of the fetal anatomy were limited today due to
 extreme maternal body habitus.
 The patient was informed that anomalies may be missed due
 to technical limitations. If the fetus is in a suboptimal position
 or maternal habitus is increased, visualization of the fetus in
 the maternal uterus may be impaired.
 The implications and management of chronic hypertension in
 pregnancy was discussed. The patient was advised that
 should her blood pressures continue to be elevated, the
 dosage of her antihypertensive medications may need to be
 increased.  The increased risk of superimposed
 preeclampsia, an indicated preterm delivery, and possible
 fetal growth restriction due to chronic hypertension in
 pregnancy was discussed. The patient was advised that we
 will continue to follow her closely throughout her pregnancy.
 We will continue to follow her with monthly growth scans.
 Weekly fetal testing should be started at around 32 weeks.
 The signs and symptoms of preeclampsia were reviewed with
 the patient today.  She will start monitoring her blood
 pressures at home.  She was advised to call should her blood
 pressures be persistently greater than 140s to 150s over 90s.
 To decrease her risk of superimposed preeclampsia, she
 should continue taking a daily baby aspirin (81 mg daily) for
 preeclampsia prophylaxis.
 A follow-up exam was scheduled in 4 weeks to complete the
 views of the fetal anatomy and to assess the fetal growth.

## 2020-07-25 ENCOUNTER — Inpatient Hospital Stay (HOSPITAL_COMMUNITY)
Admission: AD | Admit: 2020-07-25 | Discharge: 2020-07-25 | Disposition: A | Discharge status: Home / Self Care | Payer: Medicaid Other | Attending: Obstetrics & Gynecology | Admitting: Obstetrics & Gynecology

## 2020-07-25 ENCOUNTER — Encounter (HOSPITAL_COMMUNITY): Payer: Self-pay | Admitting: Obstetrics & Gynecology

## 2020-07-25 ENCOUNTER — Other Ambulatory Visit: Payer: Self-pay

## 2020-07-25 ENCOUNTER — Telehealth: Payer: Self-pay

## 2020-07-25 DIAGNOSIS — Z8759 Personal history of other complications of pregnancy, childbirth and the puerperium: Secondary | ICD-10-CM | POA: Diagnosis not present

## 2020-07-25 DIAGNOSIS — Z881 Allergy status to other antibiotic agents status: Secondary | ICD-10-CM | POA: Insufficient documentation

## 2020-07-25 DIAGNOSIS — R519 Headache, unspecified: Secondary | ICD-10-CM | POA: Diagnosis not present

## 2020-07-25 DIAGNOSIS — F17210 Nicotine dependence, cigarettes, uncomplicated: Secondary | ICD-10-CM | POA: Diagnosis not present

## 2020-07-25 DIAGNOSIS — G4452 New daily persistent headache (NDPH): Secondary | ICD-10-CM

## 2020-07-25 DIAGNOSIS — O99335 Smoking (tobacco) complicating the puerperium: Secondary | ICD-10-CM | POA: Insufficient documentation

## 2020-07-25 DIAGNOSIS — O1093 Unspecified pre-existing hypertension complicating the puerperium: Secondary | ICD-10-CM | POA: Diagnosis present

## 2020-07-25 DIAGNOSIS — Z8249 Family history of ischemic heart disease and other diseases of the circulatory system: Secondary | ICD-10-CM | POA: Insufficient documentation

## 2020-07-25 DIAGNOSIS — Z91040 Latex allergy status: Secondary | ICD-10-CM | POA: Diagnosis not present

## 2020-07-25 DIAGNOSIS — Z9889 Other specified postprocedural states: Secondary | ICD-10-CM | POA: Diagnosis not present

## 2020-07-25 DIAGNOSIS — O99893 Other specified diseases and conditions complicating puerperium: Secondary | ICD-10-CM

## 2020-07-25 DIAGNOSIS — O10919 Unspecified pre-existing hypertension complicating pregnancy, unspecified trimester: Secondary | ICD-10-CM

## 2020-07-25 LAB — CBC
HCT: 36.3 % (ref 36.0–46.0)
Hemoglobin: 10.8 g/dL — ABNORMAL LOW (ref 12.0–15.0)
MCH: 27.4 pg (ref 26.0–34.0)
MCHC: 29.8 g/dL — ABNORMAL LOW (ref 30.0–36.0)
MCV: 92.1 fL (ref 80.0–100.0)
Platelets: 301 10*3/uL (ref 150–400)
RBC: 3.94 MIL/uL (ref 3.87–5.11)
RDW: 16.6 % — ABNORMAL HIGH (ref 11.5–15.5)
WBC: 12.5 10*3/uL — ABNORMAL HIGH (ref 4.0–10.5)
nRBC: 0 % (ref 0.0–0.2)

## 2020-07-25 LAB — COMPREHENSIVE METABOLIC PANEL
ALT: 16 U/L (ref 0–44)
AST: 16 U/L (ref 15–41)
Albumin: 3 g/dL — ABNORMAL LOW (ref 3.5–5.0)
Alkaline Phosphatase: 117 U/L (ref 38–126)
Anion gap: 8 (ref 5–15)
BUN: 11 mg/dL (ref 6–20)
CO2: 27 mmol/L (ref 22–32)
Calcium: 8.7 mg/dL — ABNORMAL LOW (ref 8.9–10.3)
Chloride: 104 mmol/L (ref 98–111)
Creatinine, Ser: 0.94 mg/dL (ref 0.44–1.00)
GFR, Estimated: >60 mL/min (ref >60)
Glucose, Bld: 99 mg/dL (ref 70–99)
Potassium: 4.5 mmol/L (ref 3.5–5.1)
Sodium: 139 mmol/L (ref 135–145)
Total Bilirubin: 0.5 mg/dL (ref 0.3–1.2)
Total Protein: 6.4 g/dL — ABNORMAL LOW (ref 6.5–8.1)

## 2020-07-25 MED ORDER — DIPHENHYDRAMINE HCL 25 MG PO CAPS
50.0000 mg | ORAL_CAPSULE | Freq: Once | ORAL | Status: AC
  Administered 2020-07-25: 50 mg via ORAL
  Filled 2020-07-25: qty 2

## 2020-07-25 MED ORDER — HYDRALAZINE HCL 20 MG/ML IJ SOLN
10.0000 mg | Freq: Once | INTRAMUSCULAR | Status: AC
  Administered 2020-07-25: 10 mg via INTRAVENOUS
  Filled 2020-07-25: qty 1

## 2020-07-25 MED ORDER — NIFEDIPINE ER OSMOTIC RELEASE 30 MG PO TB24: 60.0000 mg | ORAL_TABLET | Freq: Every day | ORAL | 2 refills | Status: DC

## 2020-07-25 MED ORDER — LACTATED RINGERS IV SOLN: Freq: Once | INTRAVENOUS | Status: AC

## 2020-07-25 MED ORDER — NIFEDIPINE ER OSMOTIC RELEASE 30 MG PO TB24
60.0000 mg | ORAL_TABLET | Freq: Once | ORAL | Status: AC
  Administered 2020-07-25: 60 mg via ORAL
  Filled 2020-07-25: qty 2

## 2020-07-25 MED ORDER — DEXAMETHASONE SODIUM PHOSPHATE 10 MG/ML IJ SOLN
10.0000 mg | Freq: Once | INTRAMUSCULAR | Status: AC
  Administered 2020-07-25: 10 mg via INTRAVENOUS
  Filled 2020-07-25: qty 1

## 2020-07-25 MED ORDER — METOCLOPRAMIDE HCL 5 MG/ML IJ SOLN
10.0000 mg | Freq: Once | INTRAMUSCULAR | Status: AC
  Administered 2020-07-25: 10 mg via INTRAVENOUS
  Filled 2020-07-25: qty 2

## 2020-07-25 NOTE — Telephone Encounter (Signed)
Received a call from patient-she reports having a headache for several days now. I have advised patient she no-showed her appointment on the 18 for b/p check. She took her blood pressure this morning the reading was 196/126 this is after 20 minutes of taking her Amlodipine 5 mg.  Patient reports she has been relax this morning, however she still has an headache. I have recommended patient follow up at the ER to been seen ASAP. Patient voiced understanding at this time.

## 2020-07-25 NOTE — MAU Note (Addendum)
Vag del 10/10.  CHTN, was on medicine, switched after delivery.  Has been checking at home. Missed appt last Mon, didn't know she had it. Called her, unable to rescheduled- told to come in. HA past 2 days-has not taken anything, feels like starting now, little swelling in ankles, denies visual changes or epigastric pain.

## 2020-07-25 NOTE — MAU Provider Note (Signed)
History     CSN: [id number]  Arrival date and time: 07/25/20 9323   First Provider Initiated Contact with Patient 07/25/20 1016      Chief Complaint  Patient presents with  . Hypertension   HPI  Laura Gutierrez is a 34 y.o. G1P1001 postpartum patient who presents to MAU for evaluation of severe range blood pressure on her home cuff in the setting of Chronic HTN. She is s/p vaginal delivery on 07/13/2020.She managed her blood pressure with Labetalol which was adjusted to 600 mg TID during pregnancy.  She was discharged home in stable condition on Norvasc 5. Patient states she took her Norvasc for a couple days but felt it was ineffective and so restarted her Labetalol 600mg  TID. She was not aware that she had been scheduled for an office BP check. She endorses severe range pressures this morning on her home cuff. She took her Labetalol with breakfast prior to presenting to MAU.  Patient also c/o recurrent headache, onset two days ago. She states she did not attempt management with medication until last night, when she took 600mg  Motrin. This resolved her pain. She has not taken pain medicine or tried other treatments today.  Her pain score is 6/10 and located bilaterally, anterior, non-radiating.   She denies visual disturbances, RUQ/epigastric pain, new onset swelling. She is bottle feeding.  Patient receives care with [facility]   OB History    Gravida  1   Para  1   Term  1   Preterm      AB      Living  1     SAB      TAB      Ectopic      Multiple  0   Live Births  1           Past Medical History:  Diagnosis Date  . Bronchitis   . Gestational diabetes   . Gonorrhea   . Hypertension   . Morbid obesity (HCC)   . Obesity   . Syphilis     Past Surgical History:  Procedure Laterality Date  . PERINEAL LACERATION REPAIR N/A 07/13/2020   Procedure: SUTURE REPAIR PERINEAL LACERATION;  Surgeon: [facility], MD;  Location: [facility] LD ORS;  Service:  Gynecology;  Laterality: N/A;  . TONSILLECTOMY      Family History  Problem Relation Age of Onset  . Hypertension Mother   . Alzheimer's disease Maternal Aunt   . Heart disease Maternal Grandfather   . Diabetes Paternal Grandmother     Social History   Tobacco Use  . Smoking status: Current Every Day Smoker    Packs/day: 0.25    Types: Cigarettes  . Smokeless tobacco: Never Used  Vaping Use  . Vaping Use: Never used  Substance Use Topics  . Alcohol use: Yes    Alcohol/week: 1.0 standard drink    Types: 1 Glasses of wine per week    Comment: sometimes    . Drug use: No    Allergies:  Allergies  Allergen Reactions  . Bactrim [Sulfamethoxazole-Trimethoprim] Anaphylaxis  . Latex     itching    Medications Prior to Admission  Medication Sig Dispense Refill Last Dose  . amLODipine (NORVASC) 5 MG tablet Take 1 tablet (5 mg total) by mouth daily. 30 tablet 2 Past Week at Unknown time  . aspirin EC 81 MG tablet Take 1 tablet (81 mg total) by mouth daily. Take after 12 weeks for prevention of preeclampsia  later in pregnancy 300 tablet 2 Past Month at Unknown time  . ibuprofen (ADVIL) 600 MG tablet Take 1 tablet (600 mg total) by mouth every 6 (six) hours as needed for moderate pain or cramping. 30 tablet 0 07/24/2020 at Unknown time  . labetalol (NORMODYNE) 300 MG tablet Take 300 mg by mouth 2 (two) times daily.   07/25/2020 at Unknown time  . Prenat-Fe Poly-Methfol-FA-DHA (VITAFOL ULTRA) 29-0.6-0.4-200 MG CAPS Take 1 capsule by mouth daily before breakfast. 90 capsule 4 Past Month at Unknown time  . albuterol (VENTOLIN HFA) 108 (90 Base) MCG/ACT inhaler Inhale 2 puffs into the lungs every 6 (six) hours as needed for wheezing or shortness of breath. 18 g 3 More than a month at Unknown time  . famotidine (PEPCID) 10 MG tablet Take 10 mg by mouth 2 (two) times daily.   More than a month at Unknown time  . Pseudoephedrine-APAP-DM (TYLENOL COLD/FLU DAY PO) Take 2 capsules by mouth.    More than a month at Unknown time    Review of Systems  Eyes: Negative for visual disturbance.  Neurological: Positive for headaches.  All other systems reviewed and are negative.  Physical Exam   Blood pressure (!) 156/97, pulse 89, temperature 98.5 F (36.9 C), temperature source Oral, resp. rate 20, weight (!) 173.6 kg, SpO2 98 %, not currently breastfeeding.  Physical Exam Vitals and nursing note reviewed. Exam conducted with a chaperone present.  Constitutional:      Appearance: Normal appearance. She is not ill-appearing.  Cardiovascular:     Rate and Rhythm: Normal rate.     Pulses: Normal pulses.     Heart sounds: Normal heart sounds.  Pulmonary:     Effort: Pulmonary effort is normal.  Abdominal:     Tenderness: There is no abdominal tenderness.  Skin:    General: Skin is warm and dry.     Capillary Refill: Capillary refill takes less than 2 seconds.  Neurological:     General: No focal deficit present.     Mental Status: She is alert.  Psychiatric:        Mood and Affect: Mood normal.        Behavior: Behavior normal.        Thought Content: Thought content normal.        Judgment: Judgment normal.     MAU Course/MDM  Procedures   --S/p 600 mg Labetalol this morning --Severe range pressure responsive to IV Hydralazine given at 1046 --Headache resolved with IV Headache Cocktail --Discussed with Dr. Macon Large. D/C Norvasc and Labetalol, initiate Procardia 60XL daily. First dose given in MAU at 1207  Patient Vitals for the past 24 hrs:  BP Temp Temp src Pulse Resp SpO2 Weight  07/25/20 1236 -- 98.7 F (37.1 C) -- -- -- -- --  07/25/20 1230 133/86 -- -- 80 -- -- --  07/25/20 1217 131/77 -- -- 87 -- -- --  07/25/20 1211 (!) 160/100 -- -- 86 -- -- --  07/25/20 1145 (!) 165/90 -- -- 86 -- 100 % --  07/25/20 1140 -- -- -- -- -- 100 % --  07/25/20 1135 -- -- -- -- -- 100 % --  07/25/20 1130 (!) 157/77 -- -- 79 -- 100 % --  07/25/20 1115 (!) 157/92 -- -- 79 --  -- --  07/25/20 1100 (!) 145/86 -- -- 78 -- -- --  07/25/20 1046 (!) 170/95 -- -- -- -- -- --  07/25/20 1020 (!) 156/97 -- --  89 -- -- --  07/25/20 1015 (!) 153/104 -- -- 88 -- -- --  07/25/20 1000 (!) 157/100 -- -- 91 20 -- --  07/25/20 0936 (!) 158/99 98.5 F (36.9 C) Oral 84 20 98 % (!) 173.6 kg   Results for orders placed or performed during the hospital encounter of 07/25/20 (from the past 24 hour(s))  CBC     Status: Abnormal   Collection Time: 07/25/20 10:51 AM  Result Value Ref Range   WBC 12.5 (H) 4.0 - 10.5 K/uL   RBC 3.94 3.87 - 5.11 MIL/uL   Hemoglobin 10.8 (L) 12.0 - 15.0 g/dL   HCT 67.8 36 - 46 %   MCV 92.1 80.0 - 100.0 fL   MCH 27.4 26.0 - 34.0 pg   MCHC 29.8 (L) 30.0 - 36.0 g/dL   RDW 93.8 (H) 10.1 - 75.1 %   Platelets 301 150 - 400 K/uL   nRBC 0.0 0.0 - 0.2 %  Comprehensive metabolic panel     Status: Abnormal   Collection Time: 07/25/20 10:51 AM  Result Value Ref Range   Sodium 139 135 - 145 mmol/L   Potassium 4.5 3.5 - 5.1 mmol/L   Chloride 104 98 - 111 mmol/L   CO2 27 22 - 32 mmol/L   Glucose, Bld 99 70 - 99 mg/dL   BUN 11 6 - 20 mg/dL   Creatinine, Ser 0.25 0.44 - 1.00 mg/dL   Calcium 8.7 (L) 8.9 - 10.3 mg/dL   Total Protein 6.4 (L) 6.5 - 8.1 g/dL   Albumin 3.0 (L) 3.5 - 5.0 g/dL   AST 16 15 - 41 U/L   ALT 16 0 - 44 U/L   Alkaline Phosphatase 117 38 - 126 U/L   Total Bilirubin 0.5 0.3 - 1.2 mg/dL   GFR, Estimated >85 >27 mL/min   Anion gap 8 5 - 15   Meds ordered this encounter  Medications  . metoCLOPramide (REGLAN) injection 10 mg  . diphenhydrAMINE (BENADRYL) capsule 50 mg  . dexamethasone (DECADRON) injection 10 mg  . lactated ringers infusion  . hydrALAZINE (APRESOLINE) injection 10 mg  . NIFEdipine (PROCARDIA-XL/NIFEDICAL-XL) 24 hr tablet 60 mg  . NIFEdipine (PROCARDIA-XL/NIFEDICAL-XL) 30 MG 24 hr tablet    Sig: Take 2 tablets (60 mg total) by mouth daily.    Dispense:  30 tablet    Refill:  2    Order Specific Question:   Supervising  Provider    Answer:   Jaynie Collins A [[id number]]   Assessment and Plan  --34 y.o. G1P1001 s/p vaginal delivery 10/10 --Chronic HTN not responsive to meds prescribed at hospital discharge --[facility] labs WNL --Denies pain at time of discharge --New regimen Procardia 60 XL daily --Discharge home in stable condition  F/U: --BP check Monday 10/25 at [facility] --High priority staff message sent to schedule BP check  Calvert Cantor, CNM 07/25/2020, 1:46 PM

## 2020-07-25 NOTE — Discharge Instructions (Signed)
Postpartum Hypertension Postpartum hypertension is high blood pressure that remains higher than normal after childbirth. You may not realize that you have postpartum hypertension if your blood pressure is not being checked regularly. In most cases, postpartum hypertension will go away on its own, usually within a week of delivery. However, for some women, medical treatment is required to prevent serious complications, such as seizures or stroke. What are the causes? This condition may be caused by one or more of the following:  Hypertension that existed before pregnancy (chronic hypertension).  Hypertension that comes on as a result of pregnancy (gestational hypertension).  Hypertensive disorders during pregnancy (preeclampsia) or seizures in women who have high blood pressure during pregnancy (eclampsia).  A condition in which the liver, platelets, and red blood cells are damaged during pregnancy (HELLP syndrome).  A condition in which the thyroid produces too much hormones (hyperthyroidism).  Other rare problems of the nerves (neurological disorders) or blood disorders. In some cases, the cause may not be known. What increases the risk? The following factors may make you more likely to develop this condition:  Chronic hypertension. In some cases, this may not have been diagnosed before pregnancy.  Obesity.  Type 2 diabetes.  Kidney disease.  History of preeclampsia or eclampsia.  Other medical conditions that change the level of hormones in the body (hormonal imbalance). What are the signs or symptoms? As with all types of hypertension, postpartum hypertension may not have any symptoms. Depending on how high your blood pressure is, you may experience:  Headaches. These may be mild, moderate, or severe. They may also be steady, constant, or sudden in onset (thunderclap headache).  Changes in your ability to see (visual changes).  Dizziness.  Shortness of breath.  Swelling  of your hands, feet, lower legs, or face. In some cases, you may have swelling in more than one of these locations.  Heart palpitations or a racing heartbeat.  Difficulty breathing while lying down.  Decrease in the amount of urine that you pass. Other rare signs and symptoms may include:  Sweating more than usual. This lasts longer than a few days after delivery.  Chest pain.  Sudden dizziness when you get up from sitting or lying down.  Seizures.  Nausea or vomiting.  Abdominal pain. How is this diagnosed? This condition may be diagnosed based on the results of a physical exam, blood pressure measurements, and blood and urine tests. You may also have other tests, such as a CT scan or an MRI, to check for other problems of postpartum hypertension. How is this treated? If blood pressure is high enough to require treatment, your options may include:  Medicines to reduce blood pressure (antihypertensives). Tell your health care provider if you are breastfeeding or if you plan to breastfeed. There are many antihypertensive medicines that are safe to take while breastfeeding.  Stopping medicines that may be causing hypertension.  Treating medical conditions that are causing hypertension.  Treating the complications of hypertension, such as seizures, stroke, or kidney problems. Your health care provider will also continue to monitor your blood pressure closely until it is within a safe range for you. Follow these instructions at home:  Take over-the-counter and prescription medicines only as told by your health care provider.  Return to your normal activities as told by your health care provider. Ask your health care provider what activities are safe for you.  Do not use any products that contain nicotine or tobacco, such as cigarettes and e-cigarettes. If   you need help quitting, ask your health care provider.  Keep all follow-up visits as told by your health care provider. This  is important. Contact a health care provider if:  Your symptoms get worse.  You have new symptoms, such as: ? A headache that does not get better. ? Dizziness. ? Visual changes. Get help right away if:  You suddenly develop swelling in your hands, ankles, or face.  You have sudden, rapid weight gain.  You develop difficulty breathing, chest pain, racing heartbeat, or heart palpitations.  You develop severe pain in your abdomen.  You have any symptoms of a stroke. "BE FAST" is an easy way to remember the main warning signs of a stroke: ? B - Balance. Signs are dizziness, sudden trouble walking, or loss of balance. ? E - Eyes. Signs are trouble seeing or a sudden change in vision. ? F - Face. Signs are sudden weakness or numbness of the face, or the face or eyelid drooping on one side. ? A - Arms. Signs are weakness or numbness in an arm. This happens suddenly and usually on one side of the body. ? S - Speech. Signs are sudden trouble speaking, slurred speech, or trouble understanding what people say. ? T - Time. Time to call emergency services. Write down what time symptoms started.  You have other signs of a stroke, such as: ? A sudden, severe headache with no known cause. ? Nausea or vomiting. ? Seizure. These symptoms may represent a serious problem that is an emergency. Do not wait to see if the symptoms will go away. Get medical help right away. Call your local emergency services (911 in the [place] Do not drive yourself to the hospital. Summary  Postpartum hypertension is high blood pressure that remains higher than normal after childbirth.  In most cases, postpartum hypertension will go away on its own, usually within a week of delivery.  For some women, medical treatment is required to prevent serious complications, such as seizures or stroke. This information is not intended to replace advice given to you by your health care provider. Make sure you discuss any questions  you have with your health care provider. Document Revised: 10/27/2018 Document Reviewed: 07/11/2017 Elsevier Patient Education  2020 Elsevier Inc.  

## 2020-07-28 ENCOUNTER — Other Ambulatory Visit: Payer: Self-pay

## 2020-07-28 ENCOUNTER — Ambulatory Visit: Payer: Medicaid Other

## 2020-07-28 VITALS — BP 132/84 | HR 111

## 2020-07-28 DIAGNOSIS — Z0130 Encounter for examination of blood pressure without abnormal findings: Secondary | ICD-10-CM

## 2020-07-28 NOTE — Progress Notes (Signed)
..  Subjective:  Laura Gutierrez is a 34 y.o. female here for BP check.   Hypertension ROS: taking medications as instructed, no medication side effects noted, no TIA's, no chest pain on exertion, no dyspnea on exertion and no swelling of ankles.    Objective:  BP 132/84   Pulse (!) 111   Appearance alert, well appearing, and in no distress. General exam BP noted to be well controlled today in office.    Assessment:   Blood Pressure stable.   Plan:  Current treatment plan is effective, no change in therapy.  Advised pt to continue taking NIFEdipine as prescribed and to keep pp appt for 08-25-20, pt agreed.Marland Kitchen

## 2020-07-30 NOTE — Progress Notes (Signed)
I have reviewed this chart and agree with the RN/CMA assessment and management.    K. Meryl Taronda Comacho, M.D. Attending [facility]   

## 2020-08-04 DIAGNOSIS — N898 Other specified noninflammatory disorders of vagina: Secondary | ICD-10-CM

## 2020-08-05 MED ORDER — METRONIDAZOLE 500 MG PO TABS: 500.0000 mg | ORAL_TABLET | Freq: Two times a day (BID) | ORAL | 0 refills | Status: DC

## 2020-08-25 ENCOUNTER — Other Ambulatory Visit: Payer: Medicaid Other

## 2020-08-25 ENCOUNTER — Ambulatory Visit: Payer: Medicaid Other | Admitting: Obstetrics and Gynecology

## 2020-08-26 ENCOUNTER — Telehealth: Payer: Self-pay

## 2020-08-26 DIAGNOSIS — I10 Essential (primary) hypertension: Secondary | ICD-10-CM

## 2020-08-26 NOTE — Telephone Encounter (Signed)
TC from pt regarding swelling in both feet  pt missed appt yesterday  showed up today thiking appt was today. Pt denies any visual changes, no HA, no dizziness only swelling. Pt advised to increase water intake, avoid too much sodium, elevate legs and once she gets  home and settled to check B/P and let me know what it is so I can consult with a provider. Pt reports that she is currently taking B/P medication as directed.  Pt agreeable and will call me back /Mychart message with B/P reading.

## 2020-09-02 ENCOUNTER — Other Ambulatory Visit: Payer: Self-pay

## 2020-09-02 DIAGNOSIS — I10 Essential (primary) hypertension: Secondary | ICD-10-CM

## 2020-09-02 MED ORDER — NIFEDIPINE ER OSMOTIC RELEASE 30 MG PO TB24: 60.0000 mg | ORAL_TABLET | Freq: Every day | ORAL | 2 refills | Status: DC

## 2020-09-02 NOTE — Progress Notes (Signed)
Rx refilled so that pt has enough to take BID pt ran out of Rx and was not able to fill  Rx due to it being to early  Mychart message sent back  to pt.

## 2020-09-10 ENCOUNTER — Other Ambulatory Visit: Payer: Self-pay

## 2020-09-10 ENCOUNTER — Other Ambulatory Visit: Payer: Medicaid Other

## 2020-09-10 ENCOUNTER — Encounter: Payer: Self-pay | Admitting: Obstetrics and Gynecology

## 2020-09-10 ENCOUNTER — Ambulatory Visit (INDEPENDENT_AMBULATORY_CARE_PROVIDER_SITE_OTHER): Payer: Medicaid Other | Admitting: Obstetrics and Gynecology

## 2020-09-10 DIAGNOSIS — O99893 Other specified diseases and conditions complicating puerperium: Secondary | ICD-10-CM

## 2020-09-10 DIAGNOSIS — R4589 Other symptoms and signs involving emotional state: Secondary | ICD-10-CM | POA: Insufficient documentation

## 2020-09-10 DIAGNOSIS — Z30011 Encounter for initial prescription of contraceptive pills: Secondary | ICD-10-CM

## 2020-09-10 MED ORDER — NORETHINDRONE ACET-ETHINYL EST 1.5-30 MG-MCG PO TABS: 1.0000 | ORAL_TABLET | Freq: Every day | ORAL | 11 refills | Status: DC

## 2020-09-10 NOTE — Progress Notes (Signed)
    Post Partum Visit Note  Laura Gutierrez is a 34 y.o. G62P1001 female who presents for a postpartum visit. She is 8 weeks postpartum following a normal spontaneous vaginal delivery.  I have fully reviewed the prenatal and intrapartum course. The delivery was at 37 gestational weeks.  Anesthesia: epidural. Postpartum course has been unremarkable. Baby is doing well. Baby is feeding by bottle - Similac Alimentum. Bleeding no bleeding. Bowel function is normal. Bladder function is normal. Patient is not sexually active. Contraception method is none. Postpartum depression screening: EPDS = 11 .   The pregnancy intention screening data noted above was reviewed. Potential methods of contraception were discussed. The patient elected to proceed with Oral Contraceptive.      The following portions of the patient's history were reviewed and updated as appropriate: allergies, current medications, past family history, past medical history, past social history, past surgical history and problem list.  Review of Systems A comprehensive review of systems was negative.    Objective:  There were no vitals taken for this visit.   General:  alert, cooperative and morbidly obese   Breasts:  inspection negative, no nipple discharge or bleeding, no masses or nodularity palpable  Lungs: clear to auscultation bilaterally  Heart:  regular rate and rhythm  Abdomen: soft, non-tender; bowel sounds normal; no masses,  no organomegaly and obese   Vulva:  normal,   Vagina: normal vagina, no discharge, exudate, lesion, or erythema, second degree tear well healed  Cervix:  multiparous appearance  Corpus: normal size, contour, position, consistency, mobility, non-tender  Adnexa:  normal adnexa  Rectal Exam: Not performed.        Vitals:   09/10/20 0835  BP: 132/88  Pulse: 98    Assessment:    normal postpartum exam., possible mild postpartum depression Pap smear not done at today's visit.   Plan:    Essential components of care per ACOG recommendations:  1.  Mood and well being: Patient with borderline positive depression screening today. Reviewed local resources for support. Score was 11, will get behavorial health referral, pt notes she has been crying at home. - Patient does use tobacco. If using tobacco we discussed reduction and for recently cessation risk of relapse - hx of drug use? No    2. Infant care and feeding:  -Patient currently breastmilk feeding? No  -Social determinants of health (SDOH) reviewed in EPIC. No concerns  3. Sexuality, contraception and birth spacing - Patient does not want a pregnancy in the next year.  Desired family size is unknown .  - Reviewed forms of contraception in tiered fashion. Patient desired oral contraceptives (estrogen/progesterone) today.   - Discussed birth spacing of 18 months  4. Sleep and fatigue -Encouraged family/partner/community support of 4 hrs of uninterrupted sleep to help with mood and fatigue  5. Physical Recovery  - Discussed patients delivery and complications - Patient had a 2nd degree laceration, perineal healing reviewed. Patient expressed understanding - Patient has urinary incontinence? No - Patient is safe to resume physical and sexual activity  6.  Health Maintenance - Last pap smear done 01/08/20 and was normal with negative HPV.   7. BP check in 1 month , will eval BP due to new OCP start 2 hour GTT today  Annual exam 06/2021  [facility] [employer], [facility]

## 2020-09-10 NOTE — Patient Instructions (Signed)

## 2020-09-11 LAB — GLUCOSE TOLERANCE, 2 HOURS
Glucose, 2 hour: 97 mg/dL (ref 65–139)
Glucose, GTT - Fasting: 106 mg/dL — ABNORMAL HIGH (ref 65–99)

## 2020-09-15 ENCOUNTER — Ambulatory Visit (INDEPENDENT_AMBULATORY_CARE_PROVIDER_SITE_OTHER): Payer: Medicaid Other | Admitting: Licensed Clinical Social Worker

## 2020-09-15 DIAGNOSIS — F4321 Adjustment disorder with depressed mood: Secondary | ICD-10-CM

## 2020-09-16 NOTE — BH Specialist Note (Signed)
Integrated Behavioral Health via Telemedicine Visit  09/16/2020 Laura Gutierrez [id number]  Number of Integrated Behavioral Health visits: 1 Session Start time: 3:00pm  Session End time: 3:26pm Total time: 26 mins via mychart   Referring Provider: L. Donavan Foil MD Patient/Family location: Home  [facility] Provider location: [facility]  All persons participating in visit: Pt. Laura Gutierrez and LCSWA A. Felton Clinton  Types of Service: General Behavioral Integrated Care (BHI)  I connected with Laura Gutierrez and/or Laura Gutierrez's n/a by Video enabled telemedicine application careagility and verified that I am speaking with the correct person using two identifiers.    Discussed confidentiality: Yes   I discussed the limitations of telemedicine and the availability of in person appointments.  Discussed there is a possibility of technology failure and discussed alternative modes of communication if that failure occurs.  I discussed that engaging in this telemedicine visit, they consent to the provision of behavioral healthcare and the services will be billed under their insurance.  Patient and/or legal guardian expressed understanding and consented to Telemedicine visit: Yes   Presenting Concerns: Patient and/or family reports the following symptoms/concerns: depressed mood, crying, fatigue  Duration of problem: one month; Severity of problem: mild  Patient and/or Family's Strengths/Protective Factors: Concrete supports in place (healthy food, safe environments, etc.)  Goals Addressed: Patient will: 1.  Reduce symptoms of: depression  2.  Increase knowledge and/or ability of: coping skills  3.  Demonstrate ability to: Increase healthy adjustment to current life circumstances  Progress towards Goals: Ongoing  Interventions: Interventions utilized:  Supportive Counseling Standardized Assessments completed: PHQ 9  Assessment: Patient currently experiencing adjustment disorder with depressed mood    Patient may benefit from integrated behavioral health   Plan: 1. Follow up with behavioral health clinician on : 3 weeks via mychart  2. Behavioral recommendations: engage in self care techniques, delegate task to prevent burnout, prioritize sleep, listen to music and use effective communication technique discussed  3. Referral(s): n/a  I discussed the assessment and treatment plan with the patient and/or parent/guardian. They were provided an opportunity to ask questions and all were answered. They agreed with the plan and demonstrated an understanding of the instructions.   They were advised to call back or seek an in-person evaluation if the symptoms worsen or if the condition fails to improve as anticipated.  Gwyndolyn Saxon, LCSW

## 2020-09-22 ENCOUNTER — Ambulatory Visit (INDEPENDENT_AMBULATORY_CARE_PROVIDER_SITE_OTHER): Payer: Medicaid Other | Admitting: Primary Care

## 2020-09-22 ENCOUNTER — Other Ambulatory Visit: Payer: Self-pay

## 2020-09-22 ENCOUNTER — Encounter (INDEPENDENT_AMBULATORY_CARE_PROVIDER_SITE_OTHER): Payer: Self-pay | Admitting: Primary Care

## 2020-09-22 VITALS — BP 140/88 | HR 109 | Temp 97.5°F | Ht 66.0 in | Wt 373.4 lb

## 2020-09-22 DIAGNOSIS — R7303 Prediabetes: Secondary | ICD-10-CM

## 2020-09-22 DIAGNOSIS — Z131 Encounter for screening for diabetes mellitus: Secondary | ICD-10-CM

## 2020-09-22 DIAGNOSIS — I10 Essential (primary) hypertension: Secondary | ICD-10-CM

## 2020-09-22 LAB — POCT GLYCOSYLATED HEMOGLOBIN (HGB A1C): Hemoglobin A1C: 6.1 % — AB (ref 4.0–5.6)

## 2020-09-22 MED ORDER — LOSARTAN POTASSIUM-HCTZ 100-25 MG PO TABS: 1.0000 | ORAL_TABLET | Freq: Every day | ORAL | 3 refills | Status: DC

## 2020-09-22 MED ORDER — NIFEDIPINE ER 60 MG PO TB24: 60.0000 mg | ORAL_TABLET | Freq: Every day | ORAL | 1 refills | Status: DC

## 2020-09-22 NOTE — Progress Notes (Signed)
Established Patient Office Visit  Subjective:  Patient ID: Laura Gutierrez, female    DOB: Sep 30, 1986  Age: 34 y.o. MRN: [id number]  CC:  Chief Complaint  Patient presents with  . screenig for diabetes     HPI Laura Gutierrez is a 34 year old morbid obese female who presents for the management of hypertension.  She just recently stopped breast-feeding her son is 84 months old and taking a bottle well.  Changes in medications will be made at this visit for her blood pressure.  She denies  shortness of breath, headaches, or  chest pain. She  lower extremity edema (ankles)  Past Medical History:  Diagnosis Date  . Bronchitis   . Gestational diabetes   . Gonorrhea   . Hypertension   . Morbid obesity (HCC)   . Obesity   . Syphilis     Past Surgical History:  Procedure Laterality Date  . PERINEAL LACERATION REPAIR N/A 07/13/2020   Procedure: SUTURE REPAIR PERINEAL LACERATION;  Surgeon: Tereso Newcomer, MD;  Location: [facility];  Service: Gynecology;  Laterality: N/A;  . TONSILLECTOMY      Family History  Problem Relation Age of Onset  . Hypertension Mother   . Alzheimer's disease Maternal Aunt   . Heart disease Maternal Grandfather   . Diabetes Paternal Grandmother     Social History   Socioeconomic History  . Marital status: Single    Spouse name: Not on file  . Number of children: Not on file  . Years of education: Not on file  . Highest education level: Not on file  Occupational History  . Not on file  Tobacco Use  . Smoking status: Current Every Day Smoker    Packs/day: 0.25    Types: Cigarettes  . Smokeless tobacco: Never Used  Vaping Use  . Vaping Use: Never used  Substance and Sexual Activity  . Alcohol use: Yes    Alcohol/week: 1.0 standard drink    Types: 1 Glasses of wine per week    Comment: sometimes    . Drug use: No  . Sexual activity: Not Currently    Birth control/protection: None  Other Topics Concern  . Not on file  Social History  Narrative  . Not on file   Social Determinants of Health   Financial Resource Strain: Not on file  Food Insecurity: Not on file  Transportation Needs: Not on file  Physical Activity: Not on file  Stress: Not on file  Social Connections: Not on file  Intimate Partner Violence: Not on file    Outpatient Medications Prior to Visit  Medication Sig Dispense Refill  . Norethindrone Acetate-Ethinyl Estradiol (LOESTRIN 1.5/30, 21,) 1.5-30 MG-MCG tablet Take 1 tablet by mouth daily. 28 tablet 11  . NIFEdipine (PROCARDIA-XL/NIFEDICAL-XL) 30 MG 24 hr tablet Take 2 tablets (60 mg total) by mouth daily. 60 tablet 2  . albuterol (VENTOLIN HFA) 108 (90 Base) MCG/ACT inhaler Inhale 2 puffs into the lungs every 6 (six) hours as needed for wheezing or shortness of breath. (Patient not taking: Reported on 07/28/2020) 18 g 3  . aspirin EC 81 MG tablet Take 1 tablet (81 mg total) by mouth daily. Take after 12 weeks for prevention of preeclampsia later in pregnancy (Patient not taking: Reported on 07/28/2020) 300 tablet 2  . famotidine (PEPCID) 10 MG tablet Take 10 mg by mouth 2 (two) times daily. (Patient not taking: Reported on 07/28/2020)    . ibuprofen (ADVIL) 600 MG tablet Take 1 tablet (600 mg  total) by mouth every 6 (six) hours as needed for moderate pain or cramping. (Patient not taking: Reported on 07/28/2020) 30 tablet 0  . metroNIDAZOLE (FLAGYL) 500 MG tablet Take 1 tablet (500 mg total) by mouth 2 (two) times daily. (Patient not taking: Reported on 09/10/2020) 14 tablet 0  . Prenat-Fe Poly-Methfol-FA-DHA (VITAFOL ULTRA) 29-0.6-0.4-200 MG CAPS Take 1 capsule by mouth daily before breakfast. (Patient not taking: Reported on 07/28/2020) 90 capsule 4  . Pseudoephedrine-APAP-DM (TYLENOL COLD/FLU DAY PO) Take 2 capsules by mouth. (Patient not taking: Reported on 07/28/2020)     No facility-administered medications prior to visit.    Allergies  Allergen Reactions  . Bactrim  [Sulfamethoxazole-Trimethoprim] Anaphylaxis  . Latex     itching    ROS Review of Systems  Cardiovascular:       Ankle swelling.  All other systems reviewed and are negative.     Objective:    Physical Exam Vitals reviewed.  Constitutional:      Appearance: She is obese.  HENT:     Head: Normocephalic.     Right Ear: External ear normal.     Left Ear: External ear normal.     Nose: Nose normal.  Eyes:     Extraocular Movements: Extraocular movements intact.  Cardiovascular:     Rate and Rhythm: Normal rate and regular rhythm.  Pulmonary:     Effort: Pulmonary effort is normal.     Breath sounds: Normal breath sounds.  Abdominal:     General: Bowel sounds are normal.     Palpations: Abdomen is soft.  Musculoskeletal:        General: Normal range of motion.     Cervical back: Normal range of motion and neck supple.  Skin:    General: Skin is warm and dry.  Neurological:     Mental Status: She is alert and oriented to person, place, and time.  Psychiatric:        Mood and Affect: Mood normal.        Behavior: Behavior normal.        Thought Content: Thought content normal.        Judgment: Judgment normal.     BP 140/88   Pulse (!) 109   Temp (!) 97.5 F (36.4 C) (Temporal)   Ht 5\' 6"  (1.676 m)   Wt (!) 373 lb 6.4 oz (169.4 kg)   LMP 08/26/2020 (Exact Date)   SpO2 97%   Breastfeeding No   BMI 60.27 kg/m  Wt Readings from Last 3 Encounters:  09/22/20 (!) 373 lb 6.4 oz (169.4 kg)  09/10/20 (!) 368 lb (166.9 kg)  07/25/20 (!) 382 lb 11.2 oz (173.6 kg)     Health Maintenance Due  Topic Date Due  . COVID-19 Vaccine (1) Never done    There are no preventive care reminders to display for this patient.  Lab Results  Component Value Date   TSH 0.870 06/23/2018   Lab Results  Component Value Date   WBC 12.5 (H) 07/25/2020   HGB 10.8 (L) 07/25/2020   HCT 36.3 07/25/2020   MCV 92.1 07/25/2020   PLT 301 07/25/2020   Lab Results  Component Value  Date   NA 139 07/25/2020   K 4.5 07/25/2020   CO2 27 07/25/2020   GLUCOSE 99 07/25/2020   BUN 11 07/25/2020   CREATININE 0.94 07/25/2020   BILITOT 0.5 07/25/2020   ALKPHOS 117 07/25/2020   AST 16 07/25/2020   ALT 16 07/25/2020  PROT 6.4 (L) 07/25/2020   ALBUMIN 3.0 (L) 07/25/2020   CALCIUM 8.7 (L) 07/25/2020   ANIONGAP 8 07/25/2020   Lab Results  Component Value Date   CHOL 181 10/22/2011   Lab Results  Component Value Date   HDL 41 10/22/2011   Lab Results  Component Value Date   LDLCALC 121 (H) 10/22/2011   Lab Results  Component Value Date   TRIG 96 10/22/2011   Lab Results  Component Value Date   CHOLHDL 4.4 10/22/2011   Lab Results  Component Value Date   HGBA1C 6.1 (A) 09/22/2020      Assessment & Plan:  Laura Gutierrez was seen today for screenig for diabetes .  Diagnoses and all orders for this visit:  Screening for diabetes mellitus -     HgB A1c 6.1 Discussed ranges for prediabetes 5.7-6.4  Hypertension, unspecified type Blood pressure is above goal 130/80 adjustments made to medication increase    NIFEdipine 60 mg/24 daily and added losartan/hydrochlorthiazide.  This will protect the kidney nor the blood pressure and decrease swelling in ankles with a component of HCTZ as a diuretic. -     NIFEdipine (ADALAT CC) 60 MG 24 hr tablet; Take 1 tablet (60 mg total) by mouth daily. -     losartan-hydrochlorothiazide (HYZAAR) 100-25 MG tablet; Take 1 tablet by mouth daily.  Prediabetes Diagnosis of prediabetes per ADA guidelines we will not start medications at this time we have agreed we will modify our carbohydrate intake and start exercising daily reevaluate in 6 months.    Follow-up: Return in about 2 months (around 11/23/2020).    Grayce Sessions, NP

## 2020-09-22 NOTE — Patient Instructions (Signed)
Prediabetes Prediabetes is the condition of having a blood sugar (blood glucose) level that is higher than it should be, but not high enough for you to be diagnosed with type 2 diabetes. Having prediabetes puts you at risk for developing type 2 diabetes (type 2 diabetes mellitus). Prediabetes may be called impaired glucose tolerance or impaired fasting glucose. Prediabetes usually does not cause symptoms. Your health care provider can diagnose this condition with blood tests. You may be tested for prediabetes if you are overweight and if you have at least one other risk factor for prediabetes. What is blood glucose, and how is it measured? Blood glucose refers to the amount of glucose in your bloodstream. Glucose comes from eating foods that contain sugars and starches (carbohydrates), which the body breaks down into glucose. Your blood glucose level may be measured in mg/dL (milligrams per deciliter) or mmol/L (millimoles per liter). Your blood glucose may be checked with one or more of the following blood tests:  A fasting blood glucose (FBG) test. You will not be allowed to eat (you will fast) for 8 hours or longer before a blood sample is taken. ? A normal range for FBG is 70-100 mg/dl (3.9-5.6 mmol/L).  An A1c (hemoglobin A1c) blood test. This test provides information about blood glucose control over the previous 2?3months.  An oral glucose tolerance test (OGTT). This test measures your blood glucose at two times: ? After fasting. This is your baseline level. ? Two hours after you drink a beverage that contains glucose. You may be diagnosed with prediabetes:  If your FBG is 100?125 mg/dL (5.6-6.9 mmol/L).  If your A1c level is 5.7?6.4%.  If your OGTT result is 140?199 mg/dL (7.8-11 mmol/L). These blood tests may be repeated to confirm your diagnosis. How can this condition affect me? The pancreas produces a hormone (insulin) that helps to move glucose from the bloodstream into cells.  When cells in the body do not respond properly to insulin that the body makes (insulin resistance), excess glucose builds up in the blood instead of going into cells. As a result, high blood glucose (hyperglycemia) can develop, which can cause many complications. Hyperglycemia is a symptom of prediabetes. Having high blood glucose for a long time is dangerous. Too much glucose in your blood can damage your nerves and blood vessels. Long-term damage can lead to complications from diabetes, which may include:  Heart disease.  Stroke.  Blindness.  Kidney disease.  Depression.  Poor circulation in the feet and legs, which could lead to surgical removal (amputation) in severe cases. What can increase my risk? Risk factors for prediabetes include:  Having a family member with type 2 diabetes.  Being overweight or obese.  Being older than age 45.  Being of American Indian, African-American, Hispanic/Latino, or Asian/Pacific Islander descent.  Having an inactive (sedentary) lifestyle.  Having a history of heart disease.  History of gestational diabetes or polycystic ovary syndrome (PCOS), in women.  Having low levels of good cholesterol (HDL-C) or high levels of blood fats (triglycerides).  Having high blood pressure. What actions can I take to prevent diabetes?      Be physically active. ? Do moderate-intensity physical activity for 30 or more minutes on 5 or more days of the week, or as much as told by your health care provider. This could be brisk walking, biking, or water aerobics. ? Ask your health care provider what activities are safe for you. A mix of physical activities may be best, such as   walking, swimming, cycling, and strength training.  Lose weight as told by your health care provider. ? Losing 5-7% of your body weight can reverse insulin resistance. ? Your health care provider can determine how much weight loss is best for you and can help you lose weight  safely.  Follow a healthy meal plan. This includes eating lean proteins, complex carbohydrates, fresh fruits and vegetables, low-fat dairy products, and healthy fats. ? Follow instructions from your health care provider about eating or drinking restrictions. ? Make an appointment to see a diet and nutrition specialist (registered dietitian) to help you create a healthy eating plan that is right for you.  Do not smoke or use any tobacco products, such as cigarettes, chewing tobacco, and e-cigarettes. If you need help quitting, ask your health care provider.  Take over-the-counter and prescription medicines as told by your health care provider. You may be prescribed medicines that help lower the risk of type 2 diabetes.  Keep all follow-up visits as told by your health care provider. This is important. Summary  Prediabetes is the condition of having a blood sugar (blood glucose) level that is higher than it should be, but not high enough for you to be diagnosed with type 2 diabetes.  Having prediabetes puts you at risk for developing type 2 diabetes (type 2 diabetes mellitus).  To help prevent type 2 diabetes, make lifestyle changes such as being physically active and eating a healthy diet. Lose weight as told by your health care provider. This information is not intended to replace advice given to you by your health care provider. Make sure you discuss any questions you have with your health care provider. Document Revised: 01/12/2019 Document Reviewed: 11/11/2015 Elsevier Patient Education  2020 [employer]  

## 2020-09-29 ENCOUNTER — Telehealth: Payer: Self-pay | Admitting: Licensed Clinical Social Worker

## 2020-09-29 ENCOUNTER — Encounter: Payer: Medicaid Other | Admitting: Licensed Clinical Social Worker

## 2020-09-29 NOTE — Telephone Encounter (Signed)
Called pt regarding scheduled visit. Unable to leave message...mailbox is full

## 2020-10-10 ENCOUNTER — Ambulatory Visit: Payer: Medicaid Other

## 2020-10-11 ENCOUNTER — Encounter (INDEPENDENT_AMBULATORY_CARE_PROVIDER_SITE_OTHER): Payer: Self-pay | Admitting: Primary Care

## 2020-10-13 ENCOUNTER — Telehealth (INDEPENDENT_AMBULATORY_CARE_PROVIDER_SITE_OTHER): Payer: Medicaid Other | Admitting: Primary Care

## 2020-10-13 DIAGNOSIS — N6089 Other benign mammary dysplasias of unspecified breast: Secondary | ICD-10-CM

## 2020-10-13 MED ORDER — FLUCONAZOLE 150 MG PO TABS: 150.0000 mg | ORAL_TABLET | Freq: Once | ORAL | 0 refills | Status: AC

## 2020-10-13 MED ORDER — DOXYCYCLINE HYCLATE 100 MG PO TABS: 100.0000 mg | ORAL_TABLET | Freq: Two times a day (BID) | ORAL | 0 refills | Status: DC

## 2020-10-16 IMAGING — US US MFM OB FOLLOW-UP
1 series · 15 of 27 positions shown · non-contrast
Comparison: none

[Series 1: us mfm ob follow-up · 27 acquisitions, 15 frames shown]
[im 1/27]
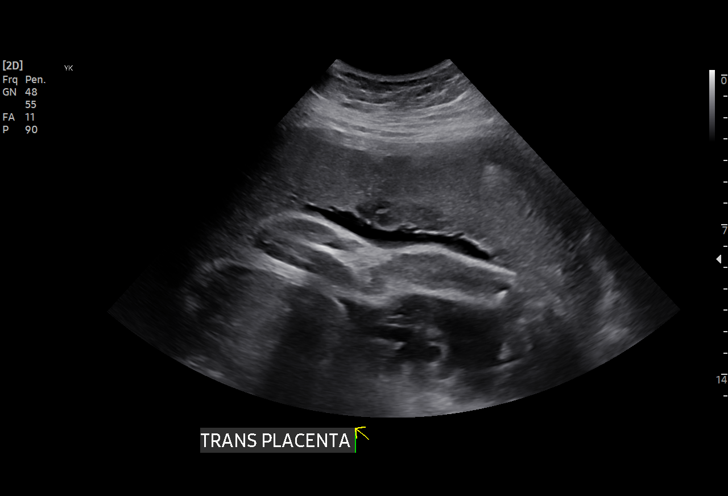
[im 3/27]
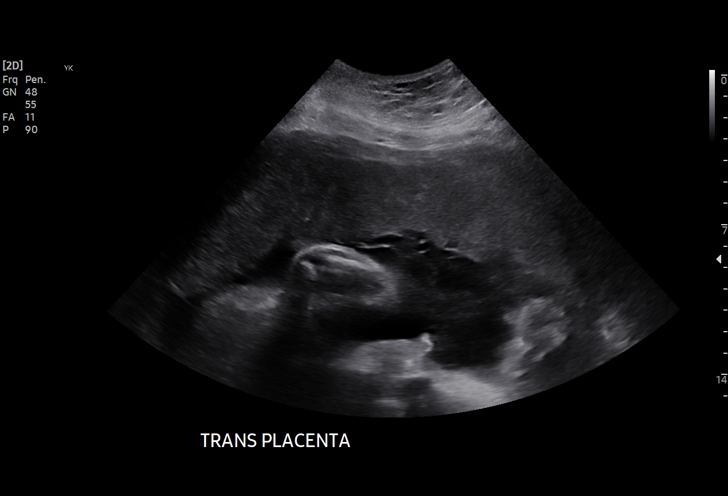
[im 5/27]
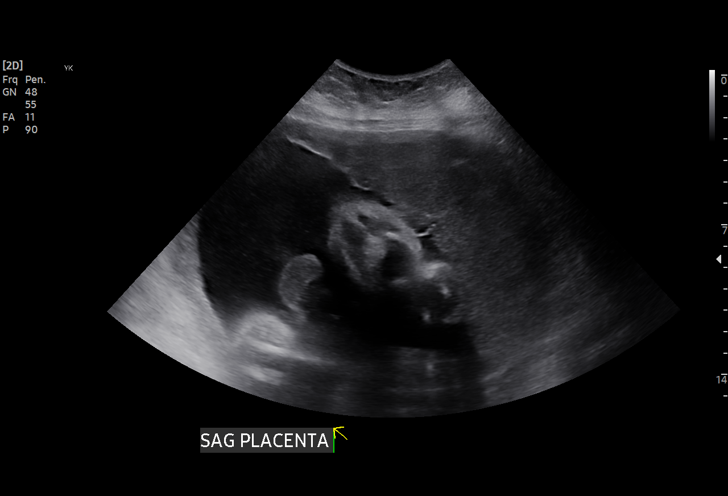
[im 7/27]
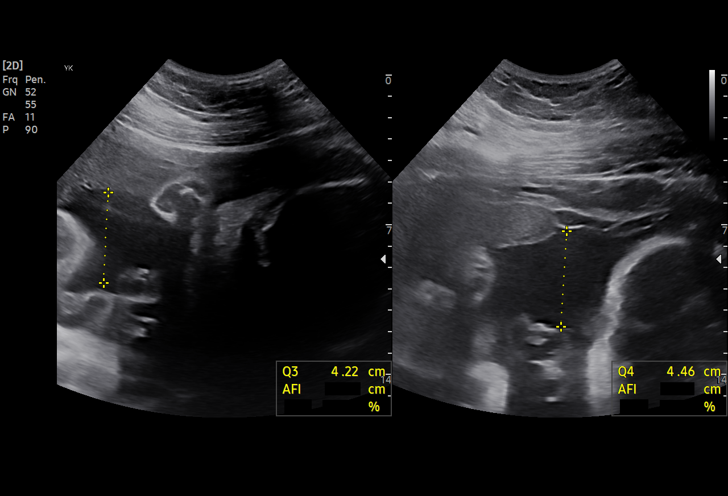
[im 9/27]
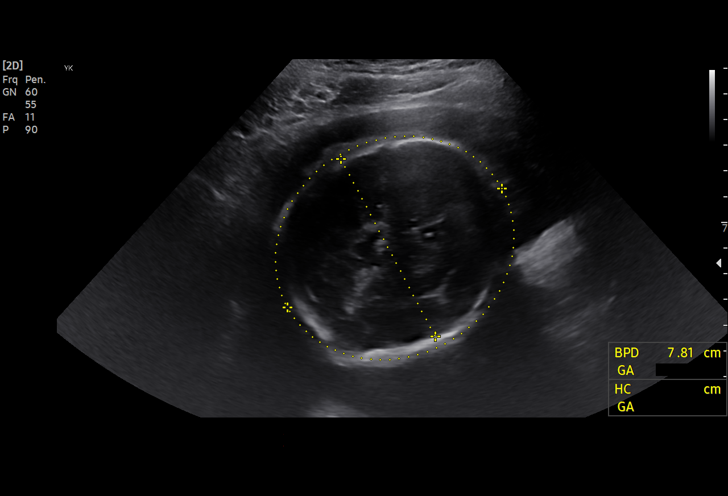
[im 10/27]
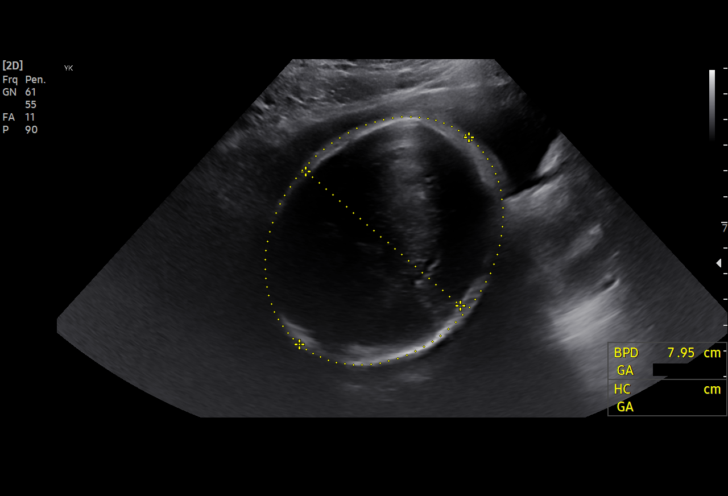
[im 12/27]
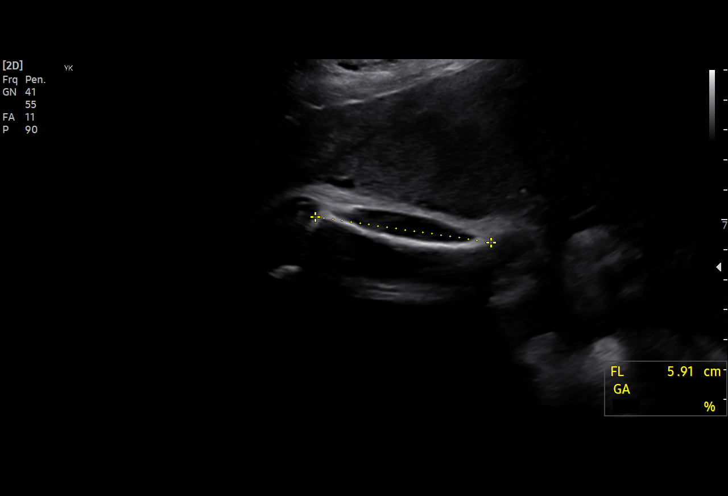
[im 14/27]
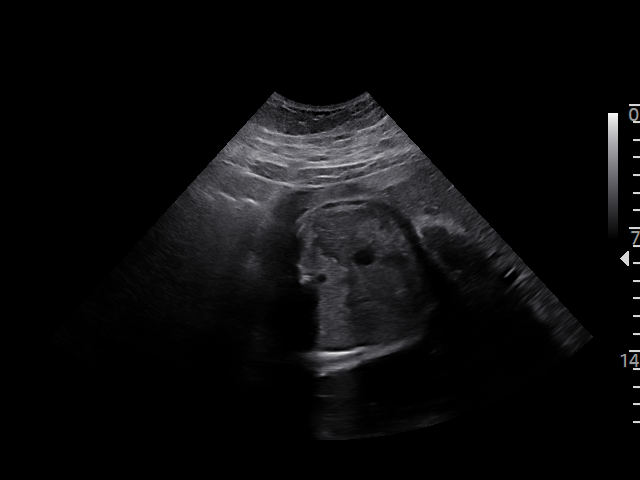
[im 16/27]
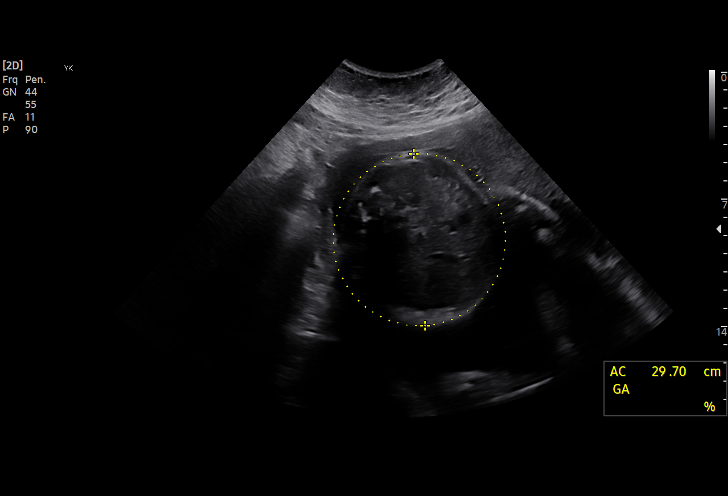
[im 18/27]
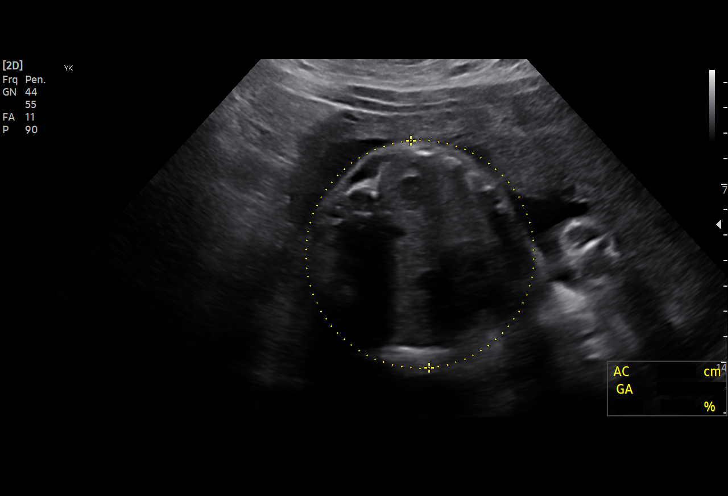
[im 19/27]
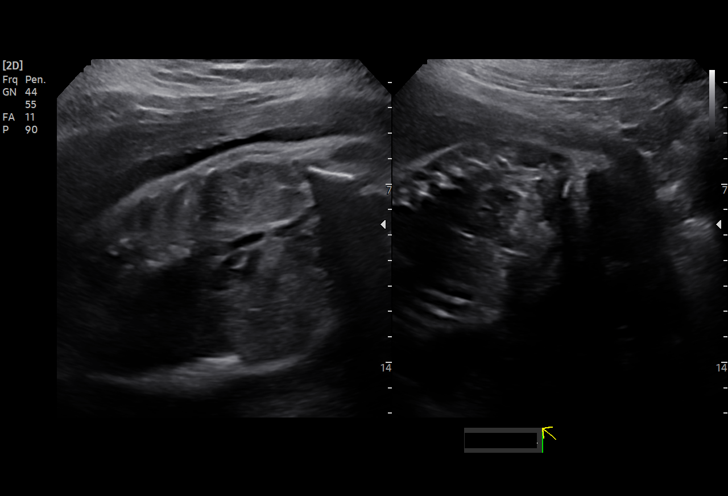
[im 21/27]
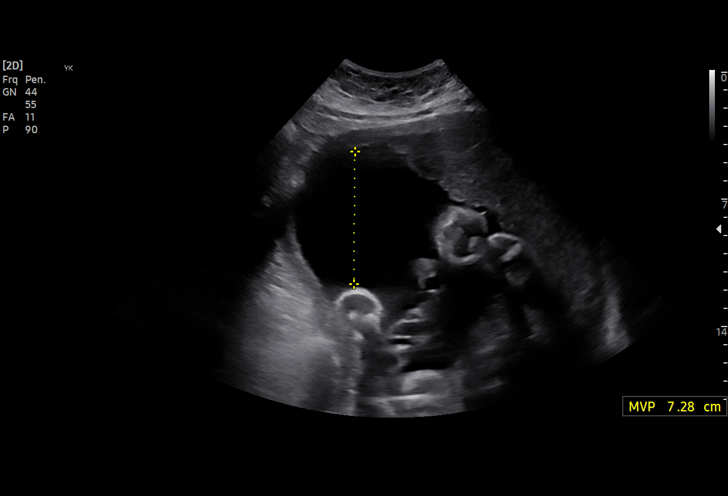
[im 23/27]
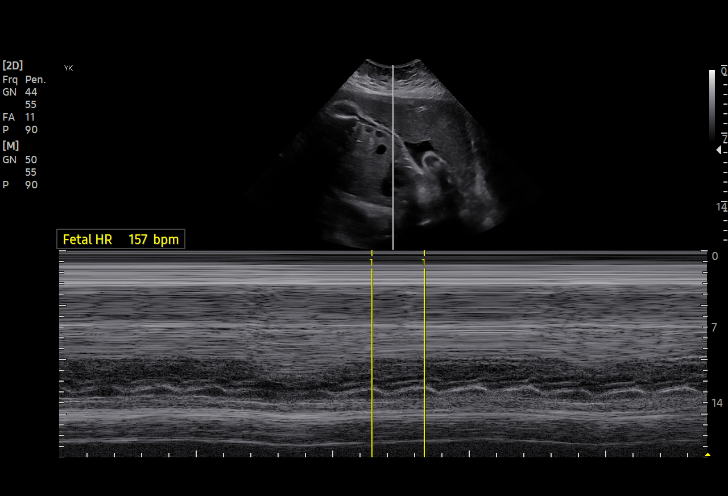
[im 25/27]
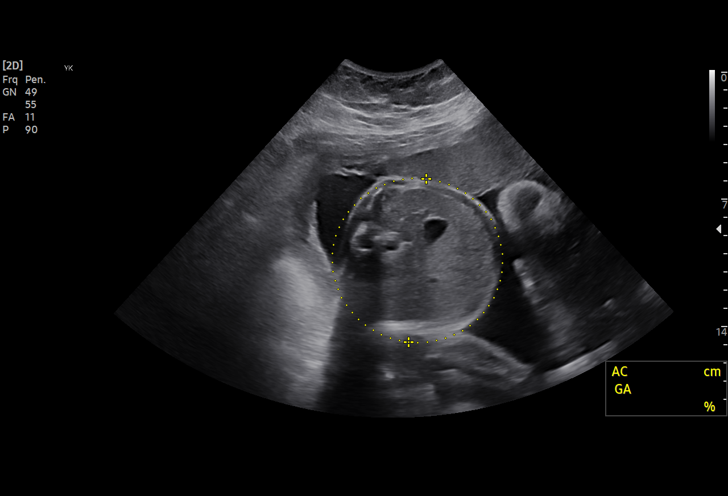
[im 27/27]
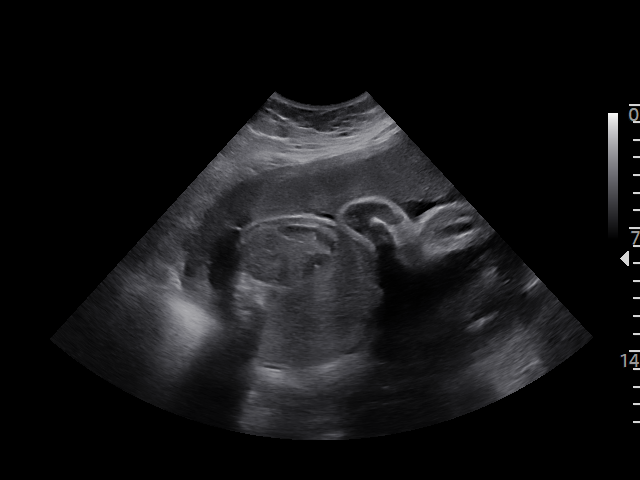

[15 of 27 positions shown; findings below may reference images not displayed]

Indications

 Hypertension - Chronic/Pre-existing
 (Labetalol, ASA)
 Tobacco use complicating pregnancy,
 second trimester
 Maternal morbid obesity (BMI 61)
 Encounter for antenatal screening for
 malformations (low risk NIPS, neg AFP, neg
 Horizon)
 31 weeks gestation of pregnancy
Vital Signs

                                                Height:        5'6"
Fetal Evaluation

 Num Of Fetuses:          1
 Fetal Heart Rate(bpm):   136
 Cardiac Activity:        Observed
 Presentation:            Cephalic
 Placenta:                Anterior
 P. Cord Insertion:       Previously Visualized

 Amniotic Fluid
 AFI FV:      Within normal limits

 AFI Sum(cm)     %Tile       Largest Pocket(cm)
 18.52           70
 RUQ(cm)       RLQ(cm)       LUQ(cm)        LLQ(cm)

Biometry

 BPD:      78.8  mm     G. Age:  31w 4d         59  %    CI:        74.67   %    70 - 86
                                                         FL/HC:       20.4  %    19.3 -
 HC:      289.4  mm     G. Age:  31w 6d         36  %    HC/AC:       0.99       0.96 -
 AC:      291.3  mm     G. Age:  33w 1d         94  %    FL/BPD:      75.0  %    71 - 87
 FL:       59.1  mm     G. Age:  30w 6d         31  %    FL/AC:       20.3  %    20 - 24

 Est. FW:    0899   gm     4 lb 4 oz     78  %
OB History

 Gravidity:    1
Gestational Age

 LMP:           31w 0d        Date:  10/23/19                 EDD:   07/29/20
 U/S Today:     31w 6d                                        EDD:   07/23/20
 Best:          31w 0d     Det. By:  LMP  (10/23/19)          EDD:   07/29/20
Anatomy

 Stomach:               Appears normal, left   Bladder:                Appears normal
                        sided
 Kidneys:               Appear normal

 Other:  Technically difficult due to maternal habitus and fetal position.
Impression

 Single intrauterine pregnancy here for a detailed anatomy
 due to Chronic hypertension and elevated maternal BMI
 Normal anatomy with measurements consistent with dates
 There is good fetal movement and amniotic fluid volume
Recommendations

 Initiate weeekly BPP
 Continue low dose ASA
 Repeat growth in 4 weeks.

## 2020-10-20 ENCOUNTER — Other Ambulatory Visit: Payer: Self-pay | Admitting: Obstetrics

## 2020-10-20 DIAGNOSIS — O10919 Unspecified pre-existing hypertension complicating pregnancy, unspecified trimester: Secondary | ICD-10-CM

## 2020-10-23 IMAGING — US US MFM FETAL BPP W/O NON-STRESS
1 series · 15 of 22 positions shown · non-contrast
Comparison: none

[Series 1: us mfm fetal bpp w/o non-stress · 22 acquisitions, 15 frames shown]
[im 1/22]
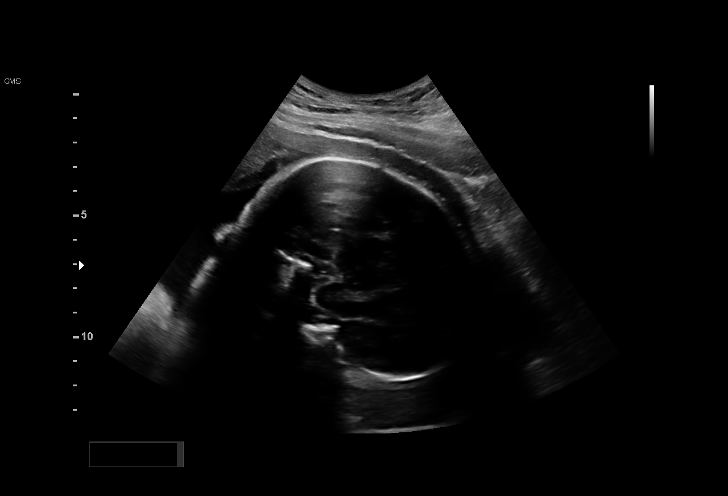
[im 3/22]
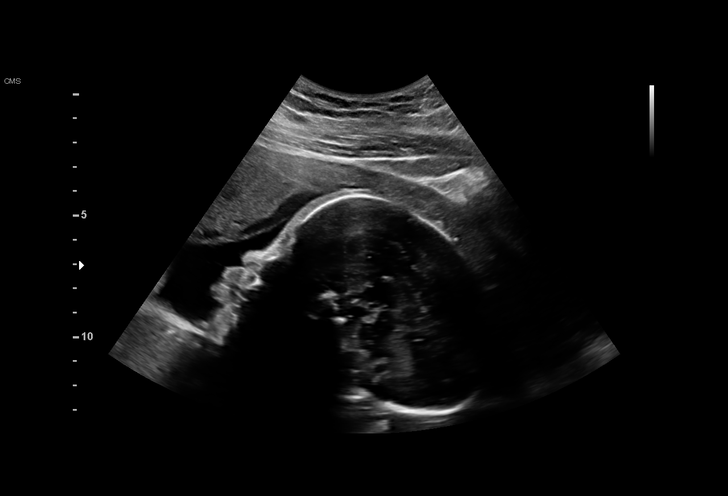
[im 4/22]
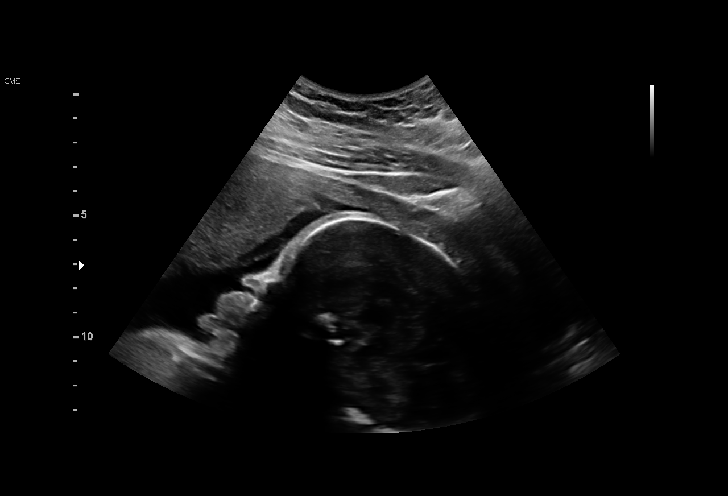
[im 6/22]
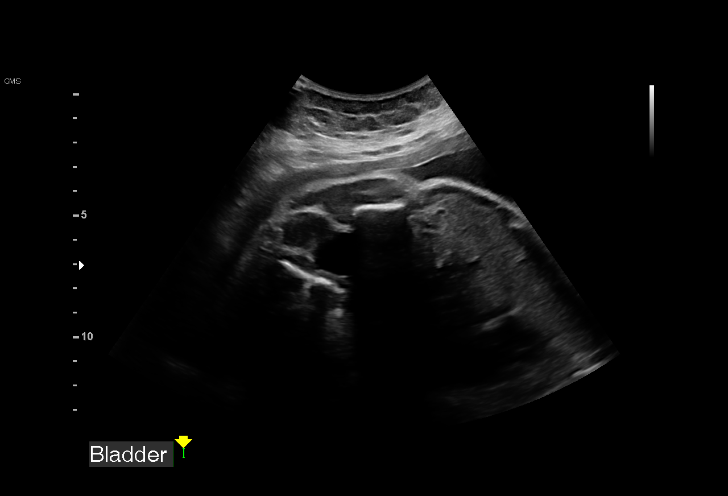
[im 7/22]
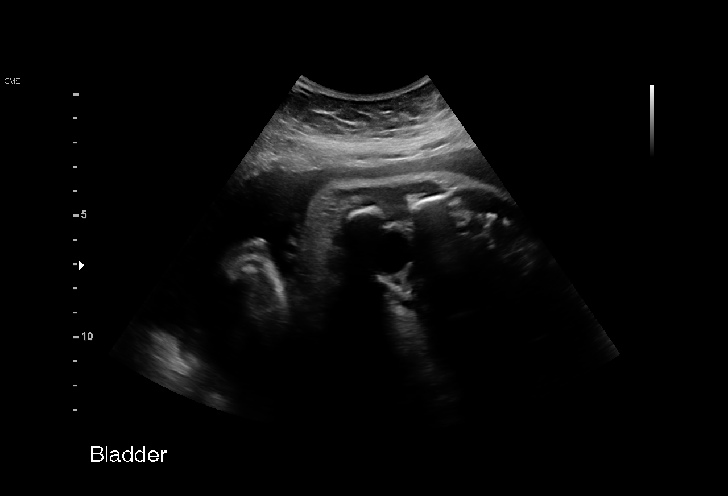
[im 9/22]
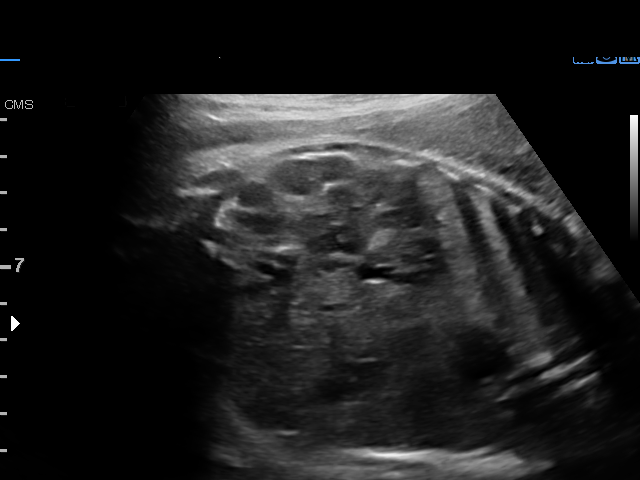
[im 10/22]
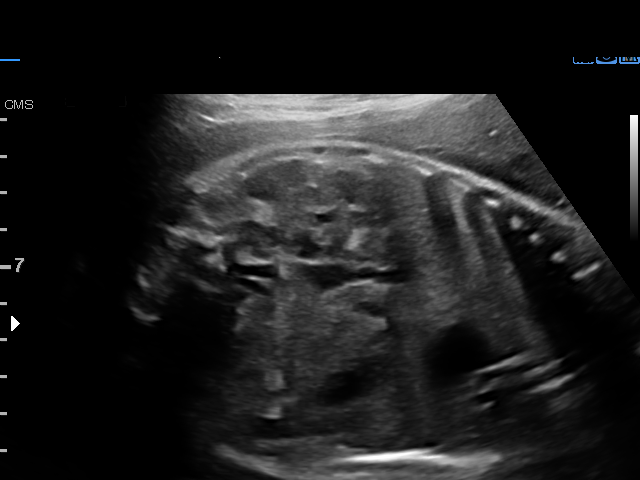
[im 12/22]
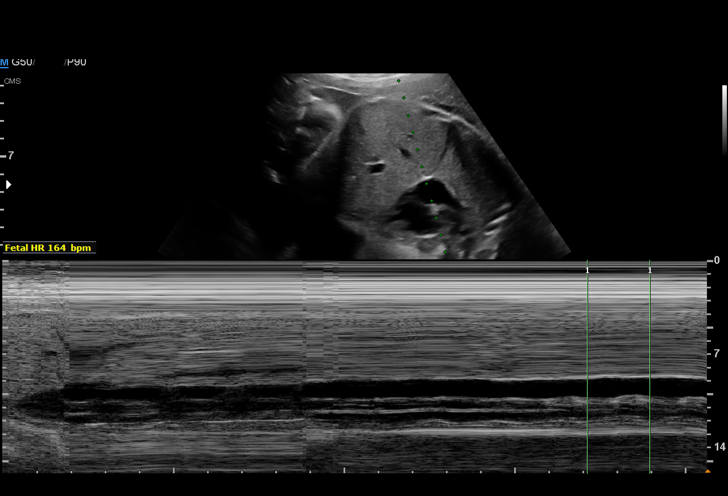
[im 13/22]
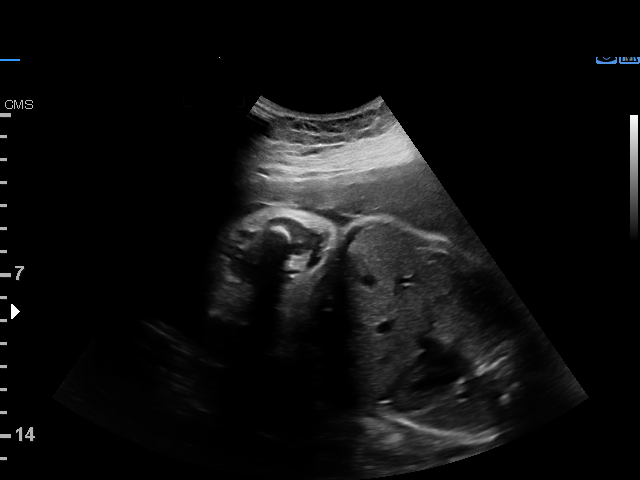
[im 14/22]
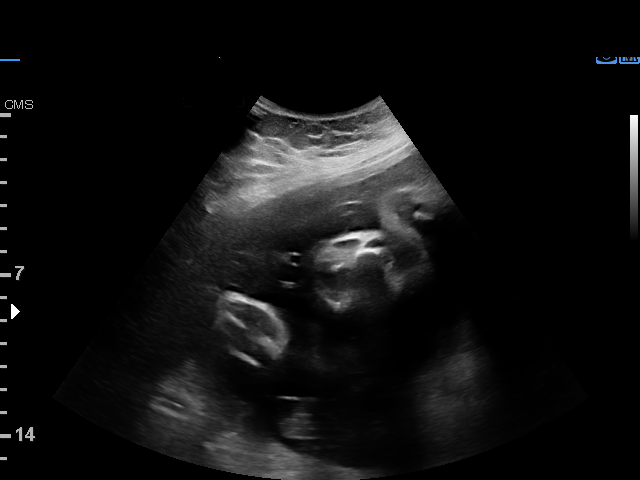
[im 16/22]
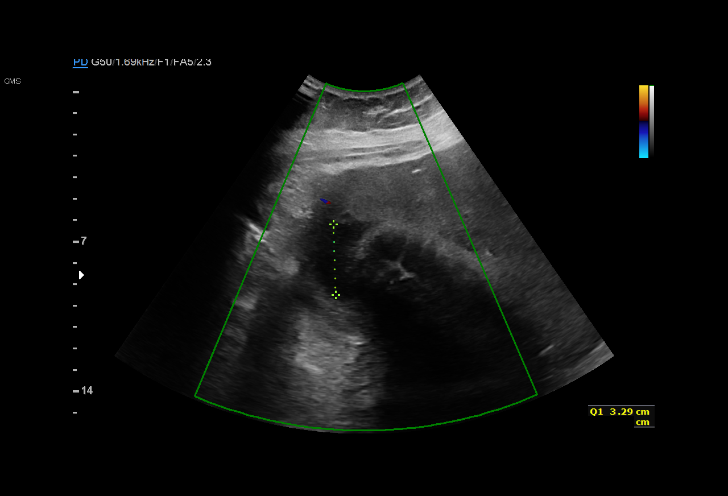
[im 17/22]
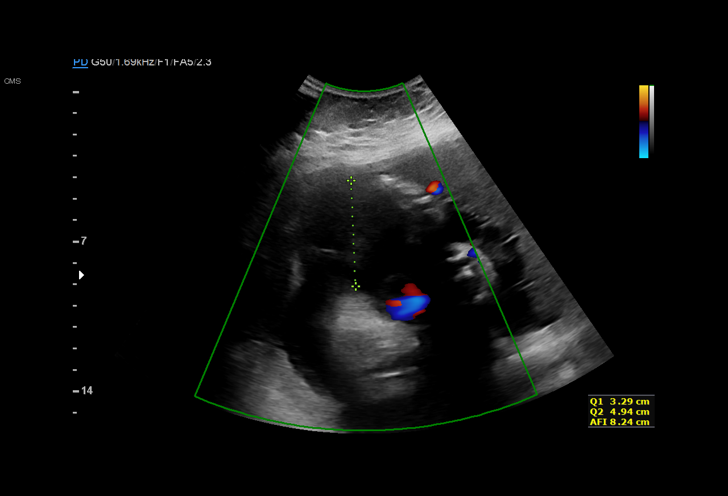
[im 19/22]
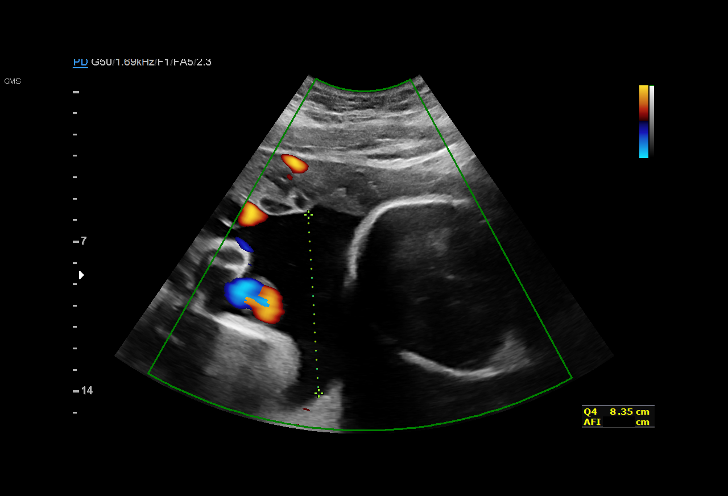
[im 20/22]
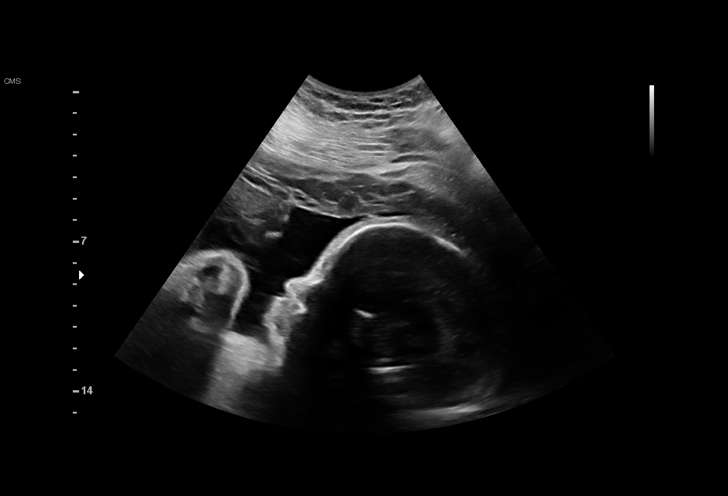
[im 22/22]
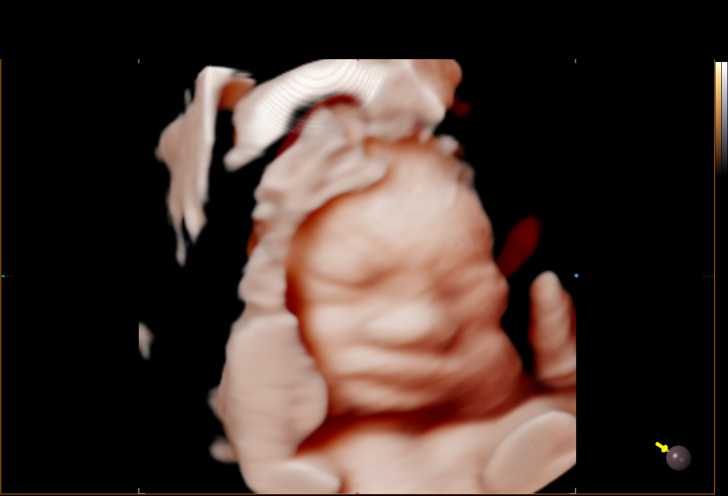

[15 of 22 positions shown; findings below may reference images not displayed]

Indications

 Encounter for other antenatal screening
 follow-up
 Hypertension - Chronic/Pre-existing
 (Labetalol, ASA)
 Tobacco use complicating pregnancy, third
 trimester
 Maternal morbid obesity (BMI 61)
 32 weeks gestation of pregnancy
Vital Signs

                                                Height:        5'6"
Fetal Evaluation

 Num Of Fetuses:         1
 Fetal Heart Rate(bpm):  164
 Cardiac Activity:       Observed
 Presentation:           Cephalic

 Amniotic Fluid
 AFI FV:      Within normal limits

 AFI Sum(cm)     %Tile       Largest Pocket(cm)
 19.6            74

 RUQ(cm)       RLQ(cm)       LUQ(cm)        LLQ(cm)
 3.3           8.4           4.9            3
Biophysical Evaluation

 Amniotic F.V:   Within normal limits       F. Tone:        Observed
 F. Movement:    Observed                   Score:          [DATE]
 F. Breathing:   Observed
OB History

 Gravidity:    1
Gestational Age

 LMP:           32w 0d        Date:  10/23/19                 EDD:   07/29/20
 Best:          32w 0d     Det. By:  LMP  (10/23/19)          EDD:   07/29/20
Impression

 Antenatal testing performed given maternal chronic
 hypertension on labetalol
 The biophysical profile was [DATE] with good fetal movement and
 amniotic fluid volume.
Recommendations

 Continue weekly testing
 Repeat growth in 3 weeks.

## 2020-10-25 NOTE — Progress Notes (Signed)
Virtual Visit  I connected with Laura Gutierrez on 10/25/20 at  3:50 PM EST by telephone and verified that I am speaking with the correct person using two identifiers.  Location: Patient: home  Provider: [name] [email or web address]    I discussed the limitations, risks, security and privacy concerns of performing an evaluation and management service by telephone and the availability of in person appointments. I also discussed with the patient that there may be a patient responsible charge related to this service. The patient expressed understanding and agreed to proceed.   History of Present Illness: [name] is a 35 year old female who has been corresponding via my chart frequent painful boils. Made suggestions  Things to consider- fragrance free deodorants, soaps , detergents. The use of antibacteria soap and Clorox baths can help. 1/4 clorox to a tub of luke warm water. Sometimes a series of antibiotics may be needed. Try to keep area dry, decrease moisture , dark area, wear cotton material -   Past Medical History:  Diagnosis Date  . Bronchitis   . Gestational diabetes   . Gonorrhea   . Hypertension   . Morbid obesity (HCC)   . Obesity   . Syphilis    Current Outpatient Medications on File Prior to Visit  Medication Sig Dispense Refill  . losartan-hydrochlorothiazide (HYZAAR) 100-25 MG tablet Take 1 tablet by mouth daily. 90 tablet 3  . NIFEdipine (ADALAT CC) 60 MG 24 hr tablet Take 1 tablet (60 mg total) by mouth daily. 90 tablet 1  . Norethindrone Acetate-Ethinyl Estradiol (LOESTRIN 1.5/30, 21,) 1.5-30 MG-MCG tablet Take 1 tablet by mouth daily. 28 tablet 11   No current facility-administered medications on file prior to visit.   Observations/Objective: There were no vitals taken for this visit.  Pertinent negative and positive for listed in HPI  Assessment and Plan: Diagnoses and all orders for this visit:  Sebaceous cyst of skin of breast, unspecified laterality  -      doxycycline (VIBRA-TABS) 100 MG tablet; Take 1 tablet (100 mg total) by mouth 2 (two) times daily.  Other orders -     fluconazole (DIFLUCAN) 150 MG tablet; Take 1 tablet (150 mg total) by mouth once for 1 dose.    Follow Up Instructions:    I discussed the assessment and treatment plan with the patient. The patient was provided an opportunity to ask questions and all were answered. The patient agreed with the plan and demonstrated an understanding of the instructions.   The patient was advised to call back or seek an in-person evaluation if the symptoms worsen or if the condition fails to improve as anticipated.  I provided 10 minutes of non-face-to-face time during this encounter.   Grayce Sessions, NP

## 2020-10-30 IMAGING — US US MFM FETAL BPP W/O NON-STRESS
1 series · 12 of 28 positions shown · non-contrast
Comparison: none

[Series 1: us mfm fetal bpp w/o non-stress · 32 acquisitions, 12 frames shown]
[im 2/32]
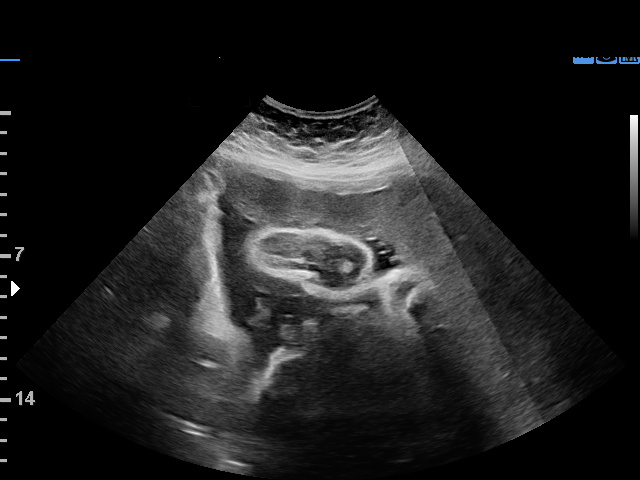
[im 4/32]
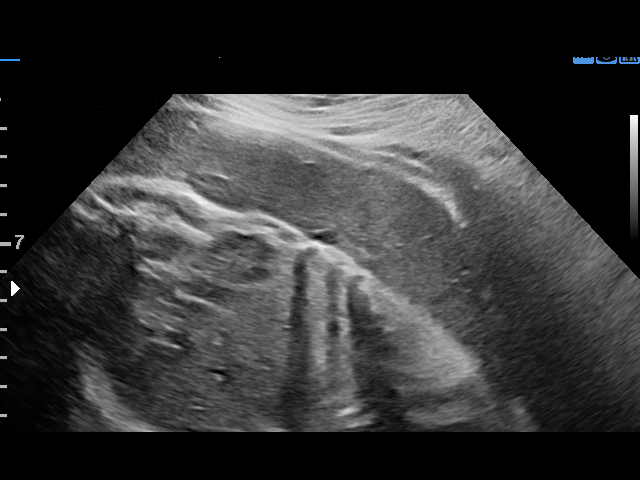
[im 6/32]
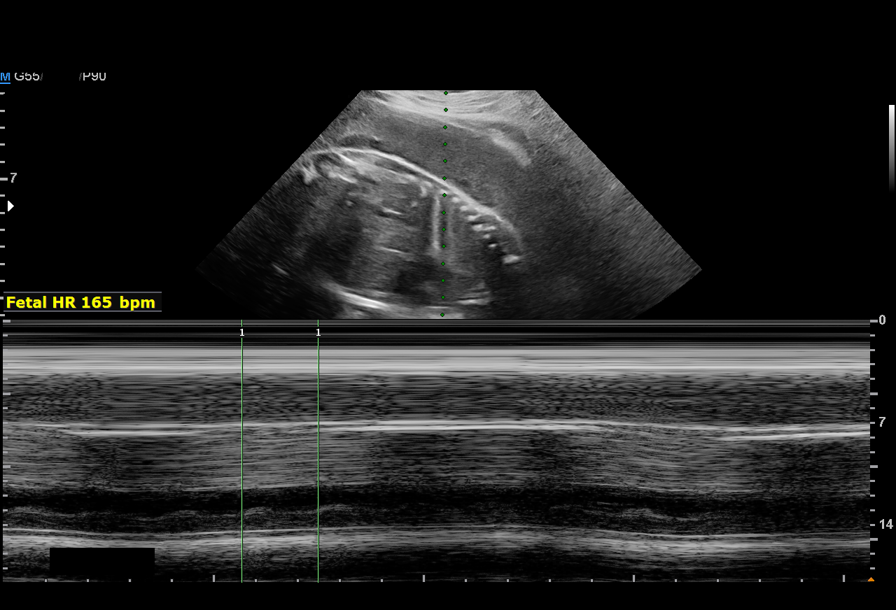
[im 10/32]
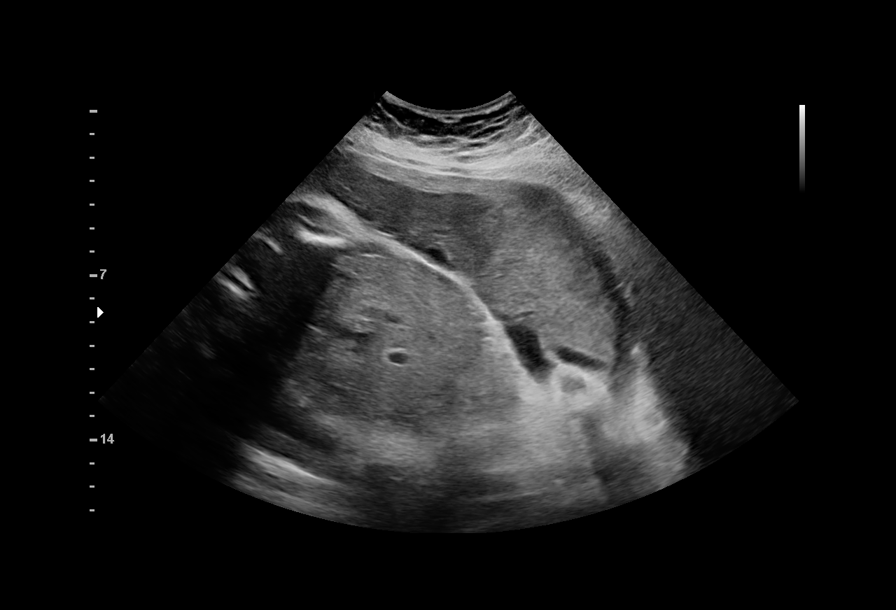
[im 12/32]
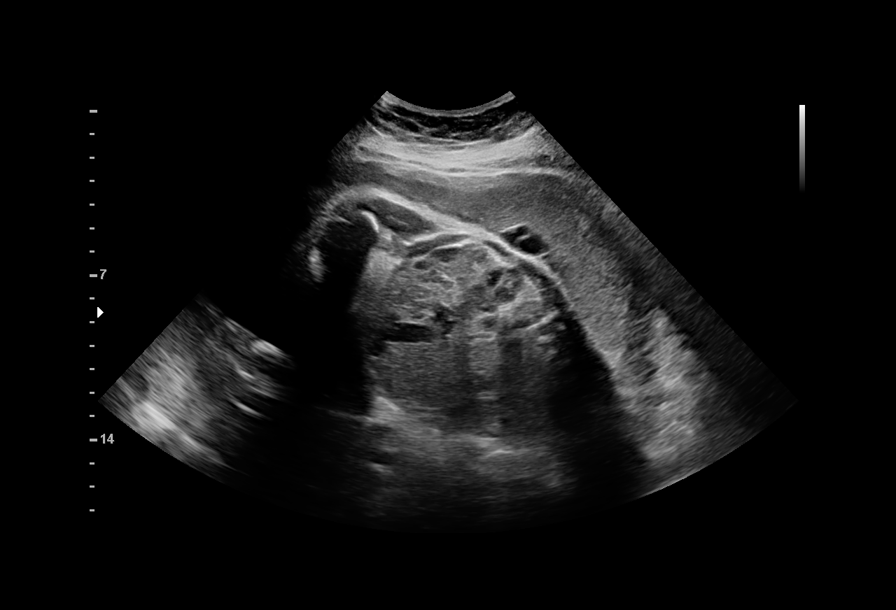
[im 14/32]
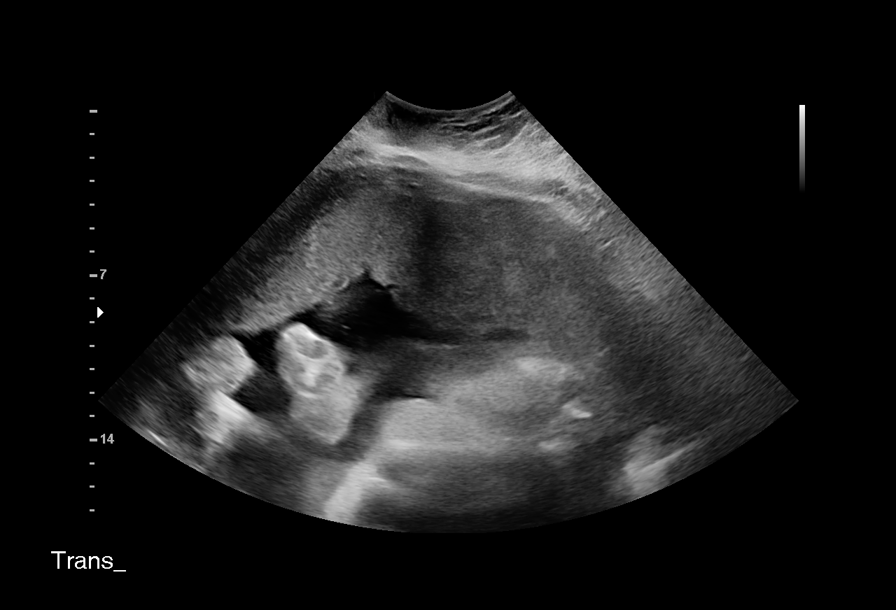
[im 18/32]
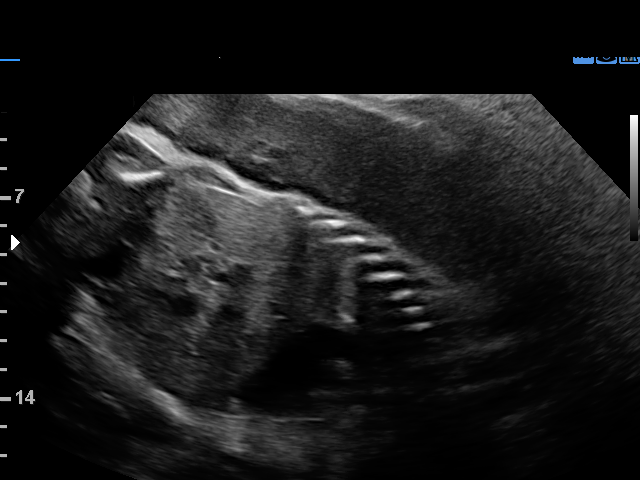
[im 20/32]
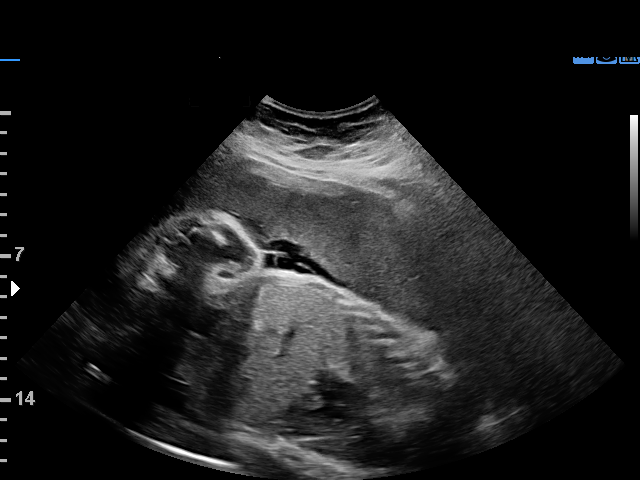
[im 22/32]
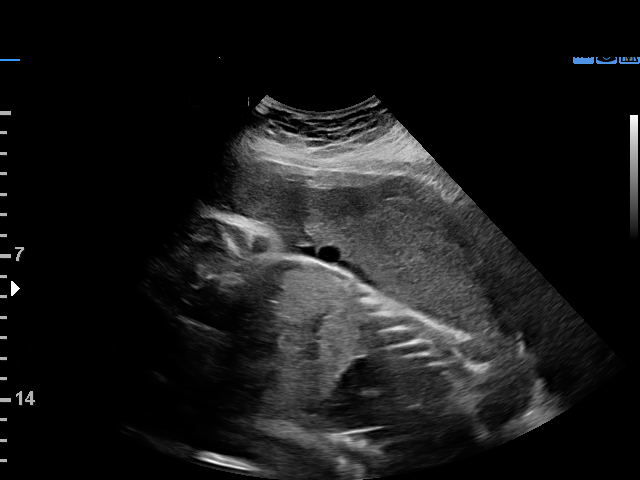
[im 26/32]
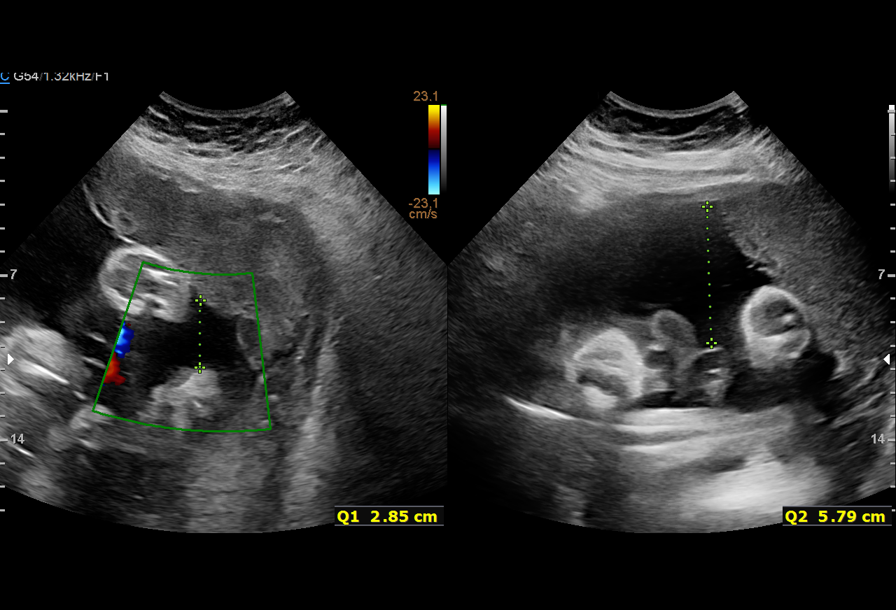
[im 28/32]
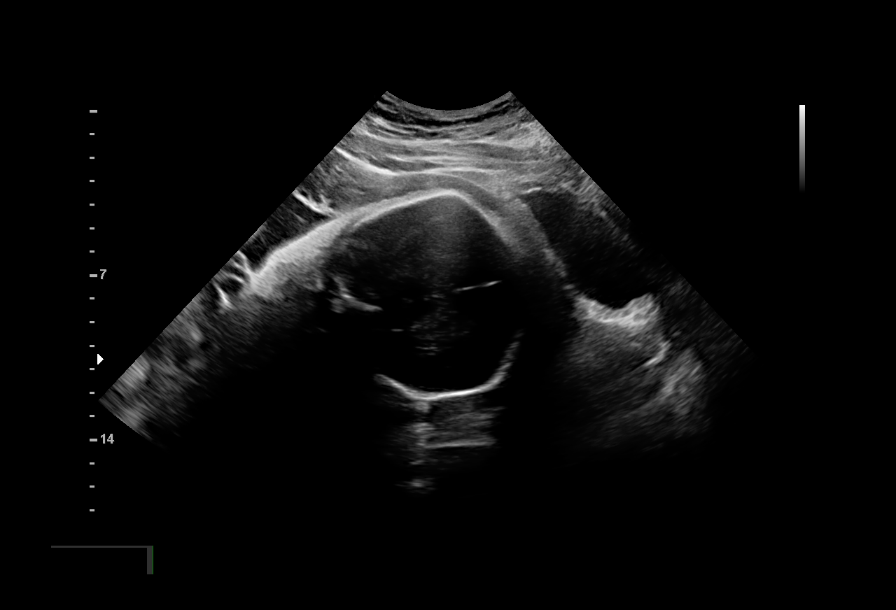
[im 30/32]
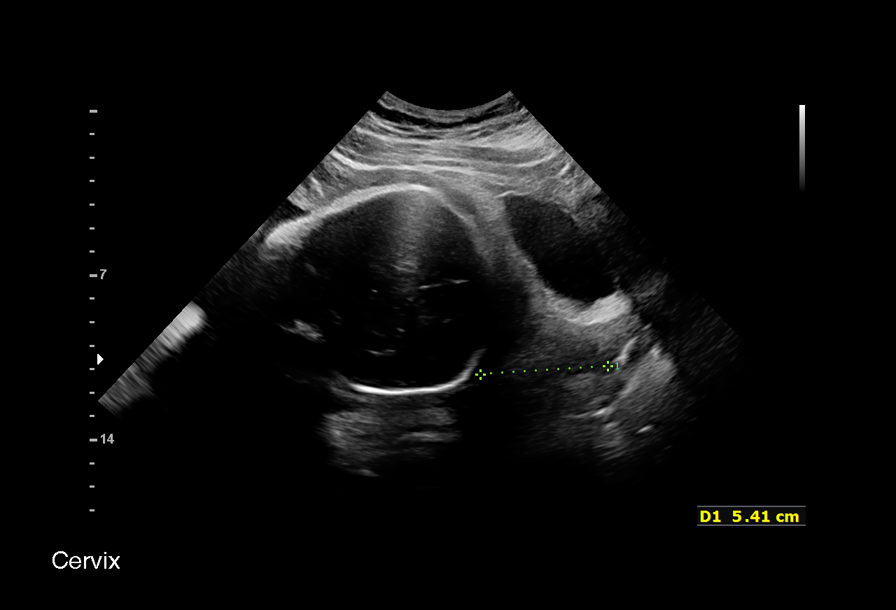

[12 of 28 positions shown; findings below may reference images not displayed]

Indications

 Encounter for other antenatal screening
 follow-up
 Hypertension - Chronic/Pre-existing
 (Labetalol, ASA)
 Tobacco use complicating pregnancy, third
 trimester
 Maternal morbid obesity (BMI 61)
 33 weeks gestation of pregnancy
Vital Signs

                                                Height:        5'6"
Fetal Evaluation

 Num Of Fetuses:         1
 Fetal Heart Rate(bpm):  161
 Cardiac Activity:       Observed
 Presentation:           Cephalic
 Placenta:               Anterior
 P. Cord Insertion:      Visualized, central

 Amniotic Fluid
 AFI FV:      Within normal limits

 AFI Sum(cm)     %Tile       Largest Pocket(cm)
 14.4            50
 RUQ(cm)       RLQ(cm)       LUQ(cm)        LLQ(cm)

Biophysical Evaluation

 Amniotic F.V:   Within normal limits       F. Tone:        Observed
 F. Movement:    Observed                   Score:          [DATE]
 F. Breathing:   Observed
OB History

 Gravidity:    1
Gestational Age

 LMP:           33w 0d        Date:  10/23/19                 EDD:   07/29/20
 Best:          33w 0d     Det. By:  LMP  (10/23/19)          EDD:   07/29/20
Anatomy

 Kidneys:               Appear normal          Bladder:                Appears normal
Cervix Uterus Adnexa

 Cervix
 Length:              5  cm.
 Normal appearance by transabdominal scan.
Impression

 Antenatal testing performed given maternal elevated BMI and
 chronic hypertension on Labetalol (BP 120/72)
 The biophysical profile was [DATE] with good fetal movement and
 amniotic fluid volume.

 Ms. Tiger also has [id number].
Recommendations

 Continue weekly testing
 Repeat growth in 2 weeks
 Delivery between 37-39 weeks.

## 2020-11-17 ENCOUNTER — Encounter (INDEPENDENT_AMBULATORY_CARE_PROVIDER_SITE_OTHER): Payer: Self-pay | Admitting: Primary Care

## 2020-11-17 ENCOUNTER — Other Ambulatory Visit (INDEPENDENT_AMBULATORY_CARE_PROVIDER_SITE_OTHER): Payer: Self-pay | Admitting: Primary Care

## 2020-11-17 DIAGNOSIS — N939 Abnormal uterine and vaginal bleeding, unspecified: Secondary | ICD-10-CM

## 2020-11-17 MED ORDER — MEDROXYPROGESTERONE ACETATE 10 MG PO TABS: 10.0000 mg | ORAL_TABLET | Freq: Every day | ORAL | 0 refills | Status: DC

## 2020-11-24 ENCOUNTER — Other Ambulatory Visit: Payer: Self-pay

## 2020-11-24 ENCOUNTER — Ambulatory Visit (INDEPENDENT_AMBULATORY_CARE_PROVIDER_SITE_OTHER): Payer: Medicaid Other | Admitting: Primary Care

## 2020-11-24 ENCOUNTER — Encounter (INDEPENDENT_AMBULATORY_CARE_PROVIDER_SITE_OTHER): Payer: Self-pay | Admitting: Primary Care

## 2020-11-24 VITALS — BP 160/111 | HR 97 | Temp 97.3°F | Ht 66.0 in | Wt 367.6 lb

## 2020-11-24 DIAGNOSIS — I10 Essential (primary) hypertension: Secondary | ICD-10-CM | POA: Diagnosis not present

## 2020-11-24 MED ORDER — CLONIDINE HCL 0.1 MG PO TABS
0.2000 mg | ORAL_TABLET | Freq: Once | ORAL | Status: AC
  Administered 2020-11-24: 0.2 mg via ORAL

## 2020-11-24 NOTE — Progress Notes (Signed)
Established Patient Office Visit  Subjective:  Patient ID: Laura Gutierrez, female    DOB: August 15, 1986  Age: 35 y.o. MRN: [id number]  CC:  Chief Complaint  Patient presents with  . Blood Pressure Check    HPI [name] is a 35 year old morbid obese female who presents for the management of hypertension.  She does admit not taking her medication every day.  We had a stern discussion about her risk factor was African-American female, morbid obesity, increased risk of diabetes secondary to gestational diabetes,a tobacco use, elevated blood pressure risk factors of having a stroke and heart attack.  She has heard this before she is upset and crying but realizes she wants to be here to take care of her son.  She has a sore agreed to take her medication every single day. Denies shortness of breath, headaches, chest pain or lower extremity edema   Past Medical History:  Diagnosis Date  . Bronchitis   . Gestational diabetes   . Gonorrhea   . Hypertension   . Morbid obesity (HCC)   . Obesity   . Syphilis     Past Surgical History:  Procedure Laterality Date  . PERINEAL LACERATION REPAIR N/A 07/13/2020   Procedure: SUTURE REPAIR PERINEAL LACERATION;  Surgeon: Tereso Newcomer, MD;  Location: [facility];  Service: Gynecology;  Laterality: N/A;  . TONSILLECTOMY      Family History  Problem Relation Age of Onset  . Hypertension Mother   . Alzheimer's disease Maternal Aunt   . Heart disease Maternal Grandfather   . Diabetes Paternal Grandmother     Social History   Socioeconomic History  . Marital status: Single    Spouse name: Not on file  . Number of children: Not on file  . Years of education: Not on file  . Highest education level: Not on file  Occupational History  . Not on file  Tobacco Use  . Smoking status: Current Every Day Smoker    Packs/day: 0.25    Types: Cigarettes  . Smokeless tobacco: Never Used  Vaping Use  . Vaping Use: Never used  Substance and  Sexual Activity  . Alcohol use: Yes    Alcohol/week: 1.0 standard drink    Types: 1 Glasses of wine per week    Comment: sometimes    . Drug use: No  . Sexual activity: Not Currently    Birth control/protection: None  Other Topics Concern  . Not on file  Social History Narrative  . Not on file   Social Determinants of Health   Financial Resource Strain: Not on file  Food Insecurity: Not on file  Transportation Needs: Not on file  Physical Activity: Not on file  Stress: Not on file  Social Connections: Not on file  Intimate Partner Violence: Not on file    Outpatient Medications Prior to Visit  Medication Sig Dispense Refill  . doxycycline (VIBRA-TABS) 100 MG tablet Take 1 tablet (100 mg total) by mouth 2 (two) times daily. 20 tablet 0  . losartan-hydrochlorothiazide (HYZAAR) 100-25 MG tablet Take 1 tablet by mouth daily. 90 tablet 3  . medroxyPROGESTERone (PROVERA) 10 MG tablet Take 1 tablet (10 mg total) by mouth daily. 14 tablet 0  . NIFEdipine (ADALAT CC) 60 MG 24 hr tablet Take 1 tablet (60 mg total) by mouth daily. 90 tablet 1   No facility-administered medications prior to visit.    Allergies  Allergen Reactions  . Bactrim [Sulfamethoxazole-Trimethoprim] Anaphylaxis  . Latex  itching    ROS Review of Systems  Constitutional: Positive for fatigue.  All other systems reviewed and are negative.     Objective:    Physical Exam Vitals reviewed.  Constitutional:      Appearance: She is obese.     Comments: Morbid obesity   HENT:     Head: Normocephalic.     Nose: Nose normal.  Cardiovascular:     Rate and Rhythm: Normal rate and regular rhythm.  Pulmonary:     Effort: Pulmonary effort is normal.     Breath sounds: Normal breath sounds.  Abdominal:     General: Bowel sounds are normal.     Palpations: Abdomen is soft.  Musculoskeletal:        General: Normal range of motion.     Cervical back: Normal range of motion and neck supple.  Skin:     General: Skin is warm and dry.  Neurological:     Mental Status: She is oriented to person, place, and time.  Psychiatric:        Mood and Affect: Mood normal.        Behavior: Behavior normal.        Thought Content: Thought content normal.        Judgment: Judgment normal.    General: Vital signs reviewed.  Patient is well-developed and well-nourished, in no acute distress and cooperative with exam.  Head: Normocephalic and atraumatic. Eyes: EOMI, conjunctivae normal, no scleral icterus.  Neck: Supple, trachea midline, normal ROM, no JVD, masses, thyromegaly, or carotid bruit present.  Cardiovascular: RRR, S1 normal, S2 normal, no murmurs, gallops, or rubs. Pulmonary/Chest: Clear to auscultation bilaterally, no wheezes, rales, or rhonchi. Abdominal: Soft, non-tender, non-distended, BS +, no masses, organomegaly, or guarding present.  Musculoskeletal: No joint deformities, erythema, or stiffness, ROM full and nontender. Extremities: No lower extremity edema bilaterally,  pulses symmetric and intact bilaterally. No cyanosis or clubbing. Neurological: A&O x3, Strength is normal and symmetric bilaterally, cranial nerve II-XII are grossly intact, no focal motor deficit, sensory intact to light touch bilaterally.  Skin: Warm, dry and intact. No rashes or erythema. Psychiatric: Normal mood and affect. speech and behavior is normal. Cognition and memory are normal.   BP (!) 169/132 (BP Location: Right Wrist, Patient Position: Sitting, Cuff Size: Normal)   Pulse 100   Temp (!) 97.3 F (36.3 C) (Temporal)   Ht 5\' 6"  (1.676 m)   Wt (!) 367 lb 9.6 oz (166.7 kg)   LMP 11/01/2020 (Exact Date)   Breastfeeding No   BMI 59.33 kg/m  Wt Readings from Last 3 Encounters:  11/24/20 (!) 367 lb 9.6 oz (166.7 kg)  09/22/20 (!) 373 lb 6.4 oz (169.4 kg)  09/10/20 (!) 368 lb (166.9 kg)     Health Maintenance Due  Topic Date Due  . COVID-19 Vaccine (1) Never done    There are no preventive care  reminders to display for this patient.  Lab Results  Component Value Date   TSH 0.870 06/23/2018   Lab Results  Component Value Date   WBC 12.5 (H) 07/25/2020   HGB 10.8 (L) 07/25/2020   HCT 36.3 07/25/2020   MCV 92.1 07/25/2020   PLT 301 07/25/2020   Lab Results  Component Value Date   NA 139 07/25/2020   K 4.5 07/25/2020   CO2 27 07/25/2020   GLUCOSE 99 07/25/2020   BUN 11 07/25/2020   CREATININE 0.94 07/25/2020   BILITOT 0.5 07/25/2020   ALKPHOS 117  07/25/2020   AST 16 07/25/2020   ALT 16 07/25/2020   PROT 6.4 (L) 07/25/2020   ALBUMIN 3.0 (L) 07/25/2020   CALCIUM 8.7 (L) 07/25/2020   ANIONGAP 8 07/25/2020   Lab Results  Component Value Date   CHOL 181 10/22/2011   Lab Results  Component Value Date   HDL 41 10/22/2011   Lab Results  Component Value Date   LDLCALC 121 (H) 10/22/2011   Lab Results  Component Value Date   TRIG 96 10/22/2011   Lab Results  Component Value Date   CHOLHDL 4.4 10/22/2011   Lab Results  Component Value Date   HGBA1C 6.1 (A) 09/22/2020      Assessment & Plan:  Felica was seen today for blood pressure check.  Diagnoses and all orders for this visit:  Hypertension, unspecified type Counseled on blood pressure goal of less than 130/80, low-sodium, DASH diet, medication compliance, 150 minutes of moderate intensity exercise per week. Discussed medication compliance, adverse effects. (see HPI)  -     cloNIDine (CATAPRES) tablet 0.2 mg   Follow-up: No follow-ups on file.    Grayce Sessions, NP

## 2020-11-24 NOTE — Patient Instructions (Signed)

## 2020-12-24 ENCOUNTER — Ambulatory Visit (INDEPENDENT_AMBULATORY_CARE_PROVIDER_SITE_OTHER): Payer: Medicaid Other | Admitting: Primary Care

## 2021-01-01 ENCOUNTER — Ambulatory Visit (INDEPENDENT_AMBULATORY_CARE_PROVIDER_SITE_OTHER): Payer: Medicaid Other | Admitting: Primary Care

## 2021-01-15 ENCOUNTER — Encounter (INDEPENDENT_AMBULATORY_CARE_PROVIDER_SITE_OTHER): Payer: Self-pay | Admitting: Primary Care

## 2021-01-19 ENCOUNTER — Encounter (INDEPENDENT_AMBULATORY_CARE_PROVIDER_SITE_OTHER): Payer: Self-pay | Admitting: Primary Care

## 2021-01-22 NOTE — Telephone Encounter (Signed)
Pt called to follow up on her question she sent 4/14 concerning her issue. Advised pt to make appt, however first available is 4/29. Please advise

## 2021-01-23 ENCOUNTER — Ambulatory Visit (INDEPENDENT_AMBULATORY_CARE_PROVIDER_SITE_OTHER): Payer: Medicaid Other | Admitting: Primary Care

## 2021-01-23 ENCOUNTER — Encounter (INDEPENDENT_AMBULATORY_CARE_PROVIDER_SITE_OTHER): Payer: Self-pay | Admitting: Primary Care

## 2021-01-23 ENCOUNTER — Other Ambulatory Visit: Payer: Self-pay

## 2021-01-23 VITALS — BP 167/111 | HR 105 | Temp 97.3°F | Ht 66.0 in | Wt 379.4 lb

## 2021-01-23 DIAGNOSIS — N3000 Acute cystitis without hematuria: Secondary | ICD-10-CM | POA: Diagnosis not present

## 2021-01-23 DIAGNOSIS — I10 Essential (primary) hypertension: Secondary | ICD-10-CM | POA: Diagnosis not present

## 2021-01-23 DIAGNOSIS — R350 Frequency of micturition: Secondary | ICD-10-CM

## 2021-01-23 LAB — POCT URINALYSIS DIP (CLINITEK)
Bilirubin, UA: NEGATIVE
Glucose, UA: NEGATIVE mg/dL
Nitrite, UA: NEGATIVE
POC PROTEIN,UA: 100 — AB
Spec Grav, UA: 1.025 (ref 1.010–1.025)
Urobilinogen, UA: 0.2 E.U./dL
pH, UA: 6.5 (ref 5.0–8.0)

## 2021-01-23 MED ORDER — FLUCONAZOLE 150 MG PO TABS: 150.0000 mg | ORAL_TABLET | Freq: Once | ORAL | 0 refills | Status: AC

## 2021-01-23 MED ORDER — PHENAZOPYRIDINE HCL 100 MG PO TABS: 100.0000 mg | ORAL_TABLET | Freq: Three times a day (TID) | ORAL | 0 refills | Status: DC | PRN

## 2021-01-23 MED ORDER — AMLODIPINE BESYLATE 10 MG PO TABS: 10.0000 mg | ORAL_TABLET | Freq: Every day | ORAL | 3 refills | Status: DC

## 2021-01-23 MED ORDER — NITROFURANTOIN MACROCRYSTAL 100 MG PO CAPS: 100.0000 mg | ORAL_CAPSULE | Freq: Two times a day (BID) | ORAL | 0 refills | Status: DC

## 2021-01-23 MED ORDER — CARVEDILOL 12.5 MG PO TABS: 12.5000 mg | ORAL_TABLET | Freq: Two times a day (BID) | ORAL | 3 refills | Status: DC

## 2021-01-23 MED ORDER — CLONIDINE HCL 0.1 MG PO TABS
0.2000 mg | ORAL_TABLET | Freq: Once | ORAL | Status: AC
  Administered 2021-01-23: 0.2 mg via ORAL

## 2021-01-23 MED ORDER — LOSARTAN POTASSIUM-HCTZ 100-25 MG PO TABS: 1.0000 | ORAL_TABLET | Freq: Every day | ORAL | 3 refills | Status: DC

## 2021-01-23 NOTE — Progress Notes (Signed)
  VZD:GLOVFIE, [name], NP Chief Complaint  Patient presents with  . Urinary Frequency    Current Issues:   Ms.  Laura Gutierrez is a 35 year old female who presents   with 5 days of dysuria and urinary frequency Associated symptoms include:  dysuria, flank pain bilaterally, urinary frequency and urinary urgency  There is a previous history of of similar symptoms. Sexually active:  Yes with female.   No concern for STI.  Prior to Admission medications   Medication Sig Start Date End Date Taking? Authorizing Provider  NIFEdipine (ADALAT CC) 60 MG 24 hr tablet Take 1 tablet (60 mg total) by mouth daily. 09/22/20  Yes Grayce Sessions, NP  nitrofurantoin (MACRODANTIN) 100 MG capsule Take 1 capsule (100 mg total) by mouth 2 (two) times daily. 01/23/21  Yes Grayce Sessions, NP  phenazopyridine (PYRIDIUM) 100 MG tablet Take 1 tablet (100 mg total) by mouth 3 (three) times daily as needed for pain. 01/23/21  Yes Grayce Sessions, NP  losartan-hydrochlorothiazide (HYZAAR) 100-25 MG tablet Take 1 tablet by mouth daily. Patient not taking: Reported on 01/23/2021 09/22/20   Grayce Sessions, NP  amLODipine (NORVASC) 5 MG tablet Take 1 tablet (5 mg total) by mouth daily. 07/15/20 07/25/20  Sharyon Cable, CNM    Review of Systems: dysuria, flank pain bilaterally, urinary frequency and urinary urgency Comprehensive negative except listed above   PE:  BP (!) 167/111 (BP Location: Right Wrist, Patient Position: Sitting, Cuff Size: Normal)   Pulse (!) 105   Temp (!) 97.3 F (36.3 C) (Temporal)   Ht 5\' 6"  (1.676 m)   Wt (!) 379 lb 6.4 oz (172.1 kg)   LMP 01/08/2021 (Exact Date)   SpO2 98%   Breastfeeding No   BMI 61.24 kg/m  Constitutional- morbid obese alert and oriented  Heart NSR  Lungs A/P CTA Back CVA tenderness  Abdomen soft distend bowl sound present    Results for orders placed or performed in visit on 01/23/21  POCT URINALYSIS DIP (CLINITEK)  Result Value Ref  Range   Color, UA yellow yellow   Clarity, UA cloudy (A) clear   Glucose, UA negative negative mg/dL   Bilirubin, UA negative negative   Ketones, POC UA trace (5) (A) negative mg/dL   Spec Grav, UA 01/25/21 3.329 - 1.025   Blood, UA trace-lysed (A) negative   pH, UA 6.5 5.0 - 8.0   POC PROTEIN,UA =100 (A) negative, trace   Urobilinogen, UA 0.2 0.2 or 1.0 E.U./dL   Nitrite, UA Negative Negative   Leukocytes, UA Large (3+) (A) Negative    Assessment and Plan:    1. Urinary frequency - POCT URINALYSIS DIP (CLINITEK) - Urinalysis  2. Acute cystitis without hematuria - Urine Culture - phenazopyridine (PYRIDIUM) 100 MG tablet; Take 1 tablet (100 mg total) by mouth 3 (three) times daily as needed for pain.  Dispense: 10 tablet; Refill: 0 - nitrofurantoin (MACRODANTIN) 100 MG capsule; Take 1 capsule (100 mg total) by mouth 2 (two) times daily.  Dispense: 14 capsule; Refill: 0 - POCT urinalysis dipstick  3. Hypertension, unspecified type Counseled on blood pressure goal of less than 130/80, low-sodium, DASH diet, medication compliance, 150 minutes of moderate intensity exercise per week. Discussed medication compliance, adverse effects. Amlodipine 10 mg daily, losartan HCTZ 100/25 discontinued Procardia - cloNIDine (CATAPRES) tablet 0.2 mg   5.188

## 2021-01-23 NOTE — Patient Instructions (Signed)
Urinary Tract Infection, Adult  A urinary tract infection (UTI) is an infection of any part of the urinary tract. The urinary tract includes the kidneys, ureters, bladder, and urethra. These organs make, store, and get rid of urine in the body. An upper UTI affects the ureters and kidneys. A lower UTI affects the bladder and urethra. What are the causes? Most urinary tract infections are caused by bacteria in your genital area around your urethra, where urine leaves your body. These bacteria grow and cause inflammation of your urinary tract. What increases the risk? You are more likely to develop this condition if:  You have a urinary catheter that stays in place.  You are not able to control when you urinate or have a bowel movement (incontinence).  You are female and you: ? Use a spermicide or diaphragm for birth control. ? Have low estrogen levels. ? Are pregnant.  You have certain genes that increase your risk.  You are sexually active.  You take antibiotic medicines.  You have a condition that causes your flow of urine to slow down, such as: ? An enlarged prostate, if you are female. ? Blockage in your urethra. ? A kidney stone. ? A nerve condition that affects your bladder control (neurogenic bladder). ? Not getting enough to drink, or not urinating often.  You have certain medical conditions, such as: ? Diabetes. ? A weak disease-fighting system (immunesystem). ? Sickle cell disease. ? Gout. ? Spinal cord injury. What are the signs or symptoms? Symptoms of this condition include:  Needing to urinate right away (urgency).  Frequent urination. This may include small amounts of urine each time you urinate.  Pain or burning with urination.  Blood in the urine.  Urine that smells bad or unusual.  Trouble urinating.  Cloudy urine.  Vaginal discharge, if you are female.  Pain in the abdomen or the lower back. You may also have:  Vomiting or a decreased  appetite.  Confusion.  Irritability or tiredness.  A fever or chills.  Diarrhea. The first symptom in older adults may be confusion. In some cases, they may not have any symptoms until the infection has worsened. How is this diagnosed? This condition is diagnosed based on your medical history and a physical exam. You may also have other tests, including:  Urine tests.  Blood tests.  Tests for STIs (sexually transmitted infections). If you have had more than one UTI, a cystoscopy or imaging studies may be done to determine the cause of the infections. How is this treated? Treatment for this condition includes:  Antibiotic medicine.  Over-the-counter medicines to treat discomfort.  Drinking enough water to stay hydrated. If you have frequent infections or have other conditions such as a kidney stone, you may need to see a health care provider who specializes in the urinary tract (urologist). In rare cases, urinary tract infections can cause sepsis. Sepsis is a life-threatening condition that occurs when the body responds to an infection. Sepsis is treated in the hospital with IV antibiotics, fluids, and other medicines. Follow these instructions at home: Medicines  Take over-the-counter and prescription medicines only as told by your health care provider.  If you were prescribed an antibiotic medicine, take it as told by your health care provider. Do not stop using the antibiotic even if you start to feel better. General instructions  Make sure you: ? Empty your bladder often and completely. Do not hold urine for long periods of time. ? Empty your bladder after   sex. ? Wipe from front to back after urinating or having a bowel movement if you are female. Use each tissue only one time when you wipe.  Drink enough fluid to keep your urine pale yellow.  Keep all follow-up visits. This is important.   Contact a health care provider if:  Your symptoms do not get better after 1-2  days.  Your symptoms go away and then return. Get help right away if:  You have severe pain in your back or your lower abdomen.  You have a fever or chills.  You have nausea or vomiting. Summary  A urinary tract infection (UTI) is an infection of any part of the urinary tract, which includes the kidneys, ureters, bladder, and urethra.  Most urinary tract infections are caused by bacteria in your genital area.  Treatment for this condition often includes antibiotic medicines.  If you were prescribed an antibiotic medicine, take it as told by your health care provider. Do not stop using the antibiotic even if you start to feel better.  Keep all follow-up visits. This is important. This information is not intended to replace advice given to you by your health care provider. Make sure you discuss any questions you have with your health care provider. Document Revised: 05/02/2020 Document Reviewed: 05/02/2020 Elsevier Patient Education  2021 [employer] Inc.  

## 2021-01-25 LAB — URINE CULTURE

## 2021-02-20 ENCOUNTER — Ambulatory Visit (INDEPENDENT_AMBULATORY_CARE_PROVIDER_SITE_OTHER): Payer: Medicaid Other | Admitting: Primary Care

## 2021-03-18 ENCOUNTER — Other Ambulatory Visit: Payer: Self-pay

## 2021-03-18 ENCOUNTER — Ambulatory Visit (INDEPENDENT_AMBULATORY_CARE_PROVIDER_SITE_OTHER): Payer: Medicaid Other | Admitting: Primary Care

## 2021-03-18 ENCOUNTER — Encounter (INDEPENDENT_AMBULATORY_CARE_PROVIDER_SITE_OTHER): Payer: Self-pay | Admitting: Primary Care

## 2021-03-18 VITALS — BP 131/90 | HR 91 | Temp 97.3°F | Resp 20 | Wt 377.0 lb

## 2021-03-18 DIAGNOSIS — I10 Essential (primary) hypertension: Secondary | ICD-10-CM | POA: Diagnosis not present

## 2021-03-18 DIAGNOSIS — J302 Other seasonal allergic rhinitis: Secondary | ICD-10-CM | POA: Diagnosis not present

## 2021-03-18 DIAGNOSIS — R21 Rash and other nonspecific skin eruption: Secondary | ICD-10-CM | POA: Diagnosis not present

## 2021-03-18 MED ORDER — FLUTICASONE PROPIONATE 50 MCG/ACT NA SUSP: 2.0000 | Freq: Every day | NASAL | 6 refills | Status: DC

## 2021-03-18 MED ORDER — TRIAMCINOLONE ACETONIDE 0.1 % EX CREA: 1.0000 "application " | TOPICAL_CREAM | Freq: Two times a day (BID) | CUTANEOUS | 0 refills | Status: DC

## 2021-03-18 MED ORDER — FEXOFENADINE-PSEUDOEPHED ER 180-240 MG PO TB24: 1.0000 | ORAL_TABLET | Freq: Every day | ORAL | 1 refills | Status: DC

## 2021-03-18 MED ORDER — LOSARTAN POTASSIUM-HCTZ 100-25 MG PO TABS: 1.0000 | ORAL_TABLET | Freq: Every day | ORAL | 3 refills | Status: DC

## 2021-03-18 NOTE — Patient Instructions (Signed)
Allergic Rhinitis, Adult Allergic rhinitis is a reaction to allergens. Allergens are things that can cause an allergic reaction. This condition affects the lining inside the nose (mucous membrane). There are two types of allergic rhinitis: Seasonal. This type is also called hay fever. It happens only during some times of the year. Perennial. This type can happen at any time of the year. This condition cannot be spread from person to person (is not contagious). It can be mild, worse, or very bad. It can develop at any age and may beoutgrown. What are the causes? This condition may be caused by: Pollen from grasses, trees, and weeds. Dust mites. Smoke. Mold. Car fumes. The pee (urine), spit, or dander of pets. Dander is dead skin cells from a pet. What increases the risk? You are more likely to develop this condition if: You have allergies in your family. You have problems like allergies in your family. You may have: Swelling of parts of your eyes and eyelids. Asthma. This affects how you breathe. Long-term redness and swelling on your skin. Food allergies. What are the signs or symptoms? The main symptom of this condition is a runny or stuffy nose (nasal congestion). Other symptoms may include: Sneezing or coughing. Itching and tearing of your eyes. Mucus that drips down the back of your throat (postnasal drip). Trouble sleeping. Feeling tired. Headache. Sore throat. How is this treated? There is no cure for this condition. You should avoid things that you are allergic to. Treatment can help to relieve symptoms. This may include: Medicines that block allergy symptoms, such as corticosteroids or antihistamines. These may be given as a shot, nasal spray, or pill. Avoiding things you are allergic to. Medicines that give you bits of what you are allergic to over time. This is called immunotherapy. It is done if other treatments do not help. You may get: Shots. Medicine under your  tongue. Stronger medicines, if other treatments do not help. Follow these instructions at home: Avoiding allergens Find out what things you are allergic to and avoid them. To do this, try these things: If you get allergies any time of year: Replace carpet with wood, tile, or vinyl flooring. Carpet can trap pet dander and dust. Do not smoke. Do not allow smoking in your home. Change your heating and air conditioning filters at least once a month. If you get allergies only some times of the year: Keep windows closed when you can. Plan things to do outside when pollen counts are lowest. Check pollen counts before you plan things to do outside. When you come indoors, change your clothes and shower before you sit on furniture or bedding. If you are allergic to a pet: Keep the pet out of your bedroom. Vacuum, sweep, and dust often.  General instructions Take over-the-counter and prescription medicines only as told by your doctor. Drink enough fluid to keep your pee (urine) pale yellow. Keep all follow-up visits as told by your doctor. This is important. Where to find more information American Academy of Allergy, Asthma & Immunology: www.aaaai.org Contact a doctor if: You have a fever. You get a cough that does not go away. You make whistling sounds when you breathe (wheeze). Your symptoms slow you down. Your symptoms stop you from doing your normal things each day. Get help right away if: You are short of breath. This symptom may be an emergency. Do not wait to see if the symptom will go away. Get medical help right away. Call your local emergency   services (911 in the [place] Do not drive yourself to the hospital. Summary Allergic rhinitis may be treated by taking medicines and avoiding things you are allergic to. If you have allergies only some of the year, keep windows closed when you can at those times. Contact your doctor if you get a fever or a cough that does not go away. This  information is not intended to replace advice given to you by your health care provider. Make sure you discuss any questions you have with your healthcare provider. Document Revised: 11/12/2019 Document Reviewed: 09/18/2019 Elsevier Patient Education  2022 [employer] Inc.  

## 2021-03-18 NOTE — Progress Notes (Signed)
Blood pressure f/u Not taking losartan. States pharmacy did not give to her.

## 2021-03-18 NOTE — Progress Notes (Signed)
[facility]   Laura Gutierrez is a 35 y.o. female presents for hypertension evaluation, Denies shortness of breath, headaches, chest pain or lower extremity edema, sudden onset, vision changes, unilateral weakness, dizziness, paresthesias   Patient reports adherence with medications. She did not take her losartan/HCTZ  because never dispense from the pharmacy . She aqlso, voiced concerns about a rash in the palm of her right hand.  Dietary habits include:monitoring sodium in take  Exercise habits include:no Family / Social history: grandfather maternal - [place]    Past Medical History:  Diagnosis Date   Bronchitis    Gestational diabetes    Gonorrhea    Hypertension    Morbid obesity (HCC)    Obesity    Syphilis    Past Surgical History:  Procedure Laterality Date   PERINEAL LACERATION REPAIR N/A 07/13/2020   Procedure: SUTURE REPAIR PERINEAL LACERATION;  Surgeon: Tereso Newcomer, MD;  Location: [facility] LD ORS;  Service: Gynecology;  Laterality: N/A;   TONSILLECTOMY     Allergies  Allergen Reactions   Bactrim [Sulfamethoxazole-Trimethoprim] Anaphylaxis   Latex     itching   Current Outpatient Medications on File Prior to Visit  Medication Sig Dispense Refill   amLODipine (NORVASC) 10 MG tablet Take 1 tablet (10 mg total) by mouth daily. 90 tablet 3   carvedilol (COREG) 12.5 MG tablet Take 1 tablet (12.5 mg total) by mouth 2 (two) times daily with a meal. 60 tablet 3   nitrofurantoin (MACRODANTIN) 100 MG capsule Take 1 capsule (100 mg total) by mouth 2 (two) times daily. (Patient not taking: Reported on 03/18/2021) 14 capsule 0   phenazopyridine (PYRIDIUM) 100 MG tablet Take 1 tablet (100 mg total) by mouth 3 (three) times daily as needed for pain. (Patient not taking: Reported on 03/18/2021) 10 tablet 0   No current facility-administered medications on file prior to visit.   Social History   Socioeconomic History   Marital status: Single    Spouse name: Not on  file   Number of children: Not on file   Years of education: Not on file   Highest education level: Not on file  Occupational History   Not on file  Tobacco Use   Smoking status: Every Day    Packs/day: 0.25    Pack years: 0.00    Types: Cigarettes   Smokeless tobacco: Never  Vaping Use   Vaping Use: Never used  Substance and Sexual Activity   Alcohol use: Yes    Alcohol/week: 1.0 standard drink    Types: 1 Glasses of wine per week    Comment: sometimes     Drug use: No   Sexual activity: Not Currently    Birth control/protection: None  Other Topics Concern   Not on file  Social History Narrative   Not on file   Social Determinants of Health   Financial Resource Strain: Not on file  Food Insecurity: Not on file  Transportation Needs: Not on file  Physical Activity: Not on file  Stress: Not on file  Social Connections: Not on file  Intimate Partner Violence: Not on file   Family History  Problem Relation Age of Onset   Hypertension Mother    Alzheimer's disease Maternal Aunt    Heart disease Maternal Grandfather    Diabetes Paternal Grandmother      OBJECTIVE:  Vitals:   03/18/21 1343  BP: 131/90  Pulse: 91  Resp: 20  Temp: (!) 97.3 F (36.3 C)  SpO2: 94%  Weight: (!) 377 lb (171 kg)    Physical Exam Vitals reviewed.  Constitutional:      Appearance: She is obese.  HENT:     Head: Normocephalic.     Right Ear: External ear normal.     Left Ear: External ear normal.     Nose: Nose normal.  Eyes:     Extraocular Movements: Extraocular movements intact.     Pupils: Pupils are equal, round, and reactive to light.  Cardiovascular:     Rate and Rhythm: Normal rate and regular rhythm.  Pulmonary:     Effort: Pulmonary effort is normal.     Breath sounds: Normal breath sounds.  Abdominal:     General: Bowel sounds are normal. There is distension.     Palpations: Abdomen is soft.  Musculoskeletal:        General: Normal range of motion.      Cervical back: Normal range of motion and neck supple.  Skin:    General: Skin is dry.  Neurological:     Mental Status: She is alert and oriented to person, place, and time.  Psychiatric:        Mood and Affect: Mood normal.        Behavior: Behavior normal.        Thought Content: Thought content normal.        Judgment: Judgment normal.    Review of Systems  HENT:  Positive for congestion.        Itchy nose   Eyes:  Positive for redness.       Watery   Skin:  Positive for rash (palm of hand).   Last 3 Office BP readings: BP Readings from Last 3 Encounters:  03/18/21 131/90  01/23/21 (!) 167/111  11/24/20 (!) 160/111    BMET    Component Value Date/Time   NA 139 07/25/2020 1051   NA 137 05/29/2020 1546   K 4.5 07/25/2020 1051   CL 104 07/25/2020 1051   CO2 27 07/25/2020 1051   GLUCOSE 99 07/25/2020 1051   BUN 11 07/25/2020 1051   BUN 8 05/29/2020 1546   CREATININE 0.94 07/25/2020 1051   CALCIUM 8.7 (L) 07/25/2020 1051   GFRNONAA >60 07/25/2020 1051   GFRAA >60 06/19/2020 0447    Renal function: CrCl cannot be calculated (Patient's most recent lab result is older than the maximum 21 days allowed.).  Clinical ASCVD: No  The ASCVD Risk score Denman George DC Jr., et al., 2013) failed to calculate for the following reasons:   The 2013 ASCVD risk score is only valid for ages 75 to 88  ASCVD risk factors include- [place]   ASSESSMENT & PLAN:  1. Seasonal allergies   2. Hypertension, unspecified type   3. Rash and nonspecific skin eruption     Meds ordered this encounter  Medications   losartan-hydrochlorothiazide (HYZAAR) 100-25 MG tablet    Sig: Take 1 tablet by mouth daily.    Dispense:  90 tablet    Refill:  3   fexofenadine-pseudoephedrine (ALLEGRA-D 24) 180-240 MG 24 hr tablet    Sig: Take 1 tablet by mouth daily.    Dispense:  30 tablet    Refill:  1    -Counseled on lifestyle modifications for blood pressure control including reduced dietary sodium,  increased exercise, weight reduction and adequate sleep. Also, educated patient about the risk for cardiovascular events, stroke and heart attack. Also counseled patient about the importance of medication adherence. If you participate in  smoking, it is important to stop using tobacco as this will increase the risks associated with uncontrolled blood pressure.   -Hypertension longstanding/newly diagnosed currently Amlodipine 10mg  QD and Coreg 12.5 BID on current medications. Patient is adherent with current medications.   Goal BP:  For patients younger than 60: Goal BP < 130/80. For patients 60 and older: Goal BP < 140/90. For patients with diabetes: Goal BP < 130/80. Your most recent BP: 131/90   Minimize salt intake. Minimize alcohol intake [place] was seen today for hypertension.  Diagnoses and all orders for this visit:  Seasonal allergies fexofenadine-pseudoephedrine (ALLEGRA-D 24) 180-240 MG 24 hr tablet; Take 1 tablet by mouth daily. Take 7 days only made aware can increase BP   Hypertension, unspecified type Counseled on blood pressure goal of less than 130/80, low-sodium, DASH diet, medication compliance, 150 minutes of moderate intensity exercise per week. Discussed medication compliance, adverse effects.  -     losartan-hydrochlorothiazide (HYZAAR) 100-25 MG tablet; Take 1 tablet by mouth daily.  Rash and nonspecific skin eruption   triamcinolone cream (KENALOG) 0.1 % bid prn   Other orders -     fexofenadine-pseudoephedrine (ALLEGRA-D 24) 180-240 MG 24 hr tablet; Take 1 tablet by mouth daily.     This note has been created with [name]. Any transcriptional errors are unintentional.   Education officer, environmental, NP 03/18/2021, 1:59 PM

## 2021-03-19 ENCOUNTER — Telehealth (INDEPENDENT_AMBULATORY_CARE_PROVIDER_SITE_OTHER): Payer: Self-pay

## 2021-03-19 NOTE — Telephone Encounter (Signed)
[facility] called and spoke to T.T., Pharmacy Technician about the refill(s). Advised the patient says they only received 1 out of 3 of the one's sent. She says they received all 3, but it's an insurance issue.  She says the patient's insurance on file is not covering, so if the patient would bring their current insurance card they will file with that insurance to see if the medications are covered. Patient called, left VM to return the call to the office to discuss or take [facility] to [facility].

## 2021-03-19 NOTE — Telephone Encounter (Signed)
Copied from CRM [id number]. Topic: Quick Communication - Rx Refill/Question >> Mar 19, 2021  3:31 PM Trula Slade wrote: Medication:  1. fluticasone (FLONASE) 50 MCG/ACT nasal spray  2. fexofenadine-pseudoephedrine (ALLEGRA-D 24) 180-240 MG 24 hr tablet  3. losartan-hydrochlorothiazide (HYZAAR) 100-25 MG tablet   Patient stated her pharmacy only received 3 out of the 4 prescriptions  Has the patient contacted their pharmacy? Yes  Agent: If no, request that the patient contact the pharmacy for the refill.) (Agent: If yes, when and what did the pharmacy advise?)    Pharmacy said those 3 meds were not sent to them  Preferred Pharmacy (with phone number [facility] street [facility] [address]  Agent: Please be advised that RX refills may take up to 3 business days. We ask that you follow-up with your pharmacy.

## 2021-03-23 ENCOUNTER — Encounter (INDEPENDENT_AMBULATORY_CARE_PROVIDER_SITE_OTHER): Payer: Self-pay | Admitting: Primary Care

## 2021-03-23 DIAGNOSIS — L0292 Furuncle, unspecified: Secondary | ICD-10-CM

## 2021-04-19 ENCOUNTER — Other Ambulatory Visit (INDEPENDENT_AMBULATORY_CARE_PROVIDER_SITE_OTHER): Payer: Self-pay | Admitting: Primary Care

## 2021-04-19 DIAGNOSIS — I10 Essential (primary) hypertension: Secondary | ICD-10-CM

## 2021-04-19 NOTE — Telephone Encounter (Signed)
last RF 01/23/21 #60 3 RF

## 2021-05-18 ENCOUNTER — Ambulatory Visit (INDEPENDENT_AMBULATORY_CARE_PROVIDER_SITE_OTHER): Payer: Medicaid Other | Admitting: Primary Care

## 2021-07-15 ENCOUNTER — Encounter (INDEPENDENT_AMBULATORY_CARE_PROVIDER_SITE_OTHER): Payer: Medicaid Other | Admitting: Primary Care

## 2021-07-30 ENCOUNTER — Encounter (INDEPENDENT_AMBULATORY_CARE_PROVIDER_SITE_OTHER): Payer: Medicaid Other | Admitting: Primary Care

## 2021-08-10 ENCOUNTER — Encounter (HOSPITAL_BASED_OUTPATIENT_CLINIC_OR_DEPARTMENT_OTHER): Payer: Self-pay

## 2021-08-10 ENCOUNTER — Encounter (INDEPENDENT_AMBULATORY_CARE_PROVIDER_SITE_OTHER): Payer: Self-pay | Admitting: Primary Care

## 2021-08-10 ENCOUNTER — Ambulatory Visit (INDEPENDENT_AMBULATORY_CARE_PROVIDER_SITE_OTHER): Payer: Self-pay

## 2021-08-10 ENCOUNTER — Other Ambulatory Visit: Payer: Self-pay

## 2021-08-10 DIAGNOSIS — Z20822 Contact with and (suspected) exposure to covid-19: Secondary | ICD-10-CM | POA: Diagnosis not present

## 2021-08-10 DIAGNOSIS — Z91040 Latex allergy status: Secondary | ICD-10-CM | POA: Insufficient documentation

## 2021-08-10 DIAGNOSIS — J209 Acute bronchitis, unspecified: Secondary | ICD-10-CM | POA: Diagnosis not present

## 2021-08-10 DIAGNOSIS — R059 Cough, unspecified: Secondary | ICD-10-CM | POA: Diagnosis present

## 2021-08-10 DIAGNOSIS — I10 Essential (primary) hypertension: Secondary | ICD-10-CM | POA: Diagnosis not present

## 2021-08-10 DIAGNOSIS — F17210 Nicotine dependence, cigarettes, uncomplicated: Secondary | ICD-10-CM | POA: Insufficient documentation

## 2021-08-10 DIAGNOSIS — Z79899 Other long term (current) drug therapy: Secondary | ICD-10-CM | POA: Insufficient documentation

## 2021-08-10 LAB — RESP PANEL BY RT-PCR (FLU A&B, COVID) ARPGX2
Influenza A by PCR: NEGATIVE
Influenza B by PCR: NEGATIVE
SARS Coronavirus 2 by RT PCR: NEGATIVE

## 2021-08-10 NOTE — ED Triage Notes (Signed)
Patient here POV from Home with Cough.  Patient states Symptoms have been present for approximately 3-5 days and have worsened since. Recent Negative COVID-19 Test.  No Fevers, No Body Aches, No N/V/D, No Sore Throat.

## 2021-08-10 NOTE — Telephone Encounter (Signed)
FYI

## 2021-08-10 NOTE — Telephone Encounter (Signed)
Pt c/o of SOB at rest and with activity and coughing spells. Pt is having to sit straight up in bed to sleep. SOB comes and goes based on activity and coughing. Pt became SOB walking across room to wash childs pacifier. Cough sounds wet and harsh over the phone. Pt has had 2 previous respiratory infections. Pt with h/o HTN. Son was dx with RSV and now pt has the same sx. Pt advised to go to ED for evaluation. Care advice given and pt verbalized understanding.   Pt needs a work note.     Reason for Disposition  [1] MODERATE longstanding difficulty breathing (e.g., speaks in phrases, SOB even at rest, pulse 100-120) AND [2] SAME as normal  Answer Assessment - Initial Assessment Questions 1. RESPIRATORY STATUS: "Describe your breathing?" (e.g., wheezing, shortness of breath, unable to speak, severe coughing)      SOB with activity, at rest, coughing spells as to sit up feels likes she cannot breath when flat 2. ONSET: "When did this breathing problem begin?"      Last Thursday 3. PATTERN "Does the difficult breathing come and go, or has it been constant since it started?"      Comes and goes 4. SEVERITY: "How bad is your breathing?" (e.g., mild, moderate, severe)    - MILD: No SOB at rest, mild SOB with walking, speaks normally in sentences, can lie down, no retractions, pulse < 100.    - MODERATE: SOB at rest, SOB with minimal exertion and prefers to sit, cannot lie down flat, speaks in phrases, mild retractions, audible wheezing, pulse 100-120.    - SEVERE: Very SOB at rest, speaks in single words, struggling to breathe, sitting hunched forward, retractions, pulse > 120      moderate 5. RECURRENT SYMPTOM: "Have you had difficulty breathing before?" If Yes, ask: "When was the last time?" and "What happened that time?"      Dx with bronchitis- inhaler, prednisone 6. CARDIAC HISTORY: "Do you have any history of heart disease?" (e.g., heart attack, angina, bypass surgery, angioplasty)       HTN 7. LUNG HISTORY: "Do you have any history of lung disease?"  (e.g., pulmonary embolus, asthma, emphysema)     no 8. CAUSE: "What do you think is causing the breathing problem?"      RSV 9. OTHER SYMPTOMS: "Do you have any other symptoms? (e.g., dizziness, runny nose, cough, chest pain, fever)     Cough,  10. O2 SATURATION MONITOR:  "Do you use an oxygen saturation monitor (pulse oximeter) at home?" If Yes, "What is your reading (oxygen level) today?" "What is your usual oxygen saturation reading?" (e.g., 95%)       no 11. PREGNANCY: "Is there any chance you are pregnant?" "When was your last menstrual period?"       No-LMP:07/24/21 12. TRAVEL: "Have you traveled out of the country in the last month?" (e.g., travel history, exposures)       no  Protocols used: Breathing Difficulty-A-AH

## 2021-08-11 ENCOUNTER — Emergency Department (HOSPITAL_BASED_OUTPATIENT_CLINIC_OR_DEPARTMENT_OTHER): Payer: Medicaid Other | Admitting: Radiology

## 2021-08-11 ENCOUNTER — Emergency Department (HOSPITAL_BASED_OUTPATIENT_CLINIC_OR_DEPARTMENT_OTHER)
Admission: EM | Admit: 2021-08-11 | Discharge: 2021-08-11 | Disposition: A | Discharge status: Home / Self Care | Payer: Medicaid Other | Attending: Emergency Medicine | Admitting: Emergency Medicine

## 2021-08-11 DIAGNOSIS — J209 Acute bronchitis, unspecified: Secondary | ICD-10-CM

## 2021-08-11 MED ORDER — ALBUTEROL SULFATE HFA 108 (90 BASE) MCG/ACT IN AERS
2.0000 | INHALATION_SPRAY | Freq: Once | RESPIRATORY_TRACT | Status: AC
  Administered 2021-08-11: 2 via RESPIRATORY_TRACT
  Filled 2021-08-11: qty 6.7

## 2021-08-11 MED ORDER — PREDNISONE 50 MG PO TABS: 50.0000 mg | ORAL_TABLET | Freq: Every day | ORAL | 0 refills | Status: DC

## 2021-08-11 MED ORDER — PREDNISONE 50 MG PO TABS
60.0000 mg | ORAL_TABLET | Freq: Once | ORAL | Status: AC
  Administered 2021-08-11: 60 mg via ORAL
  Filled 2021-08-11: qty 1

## 2021-08-11 NOTE — ED Provider Notes (Signed)
[facility] EMERGENCY DEPT Provider Note   CSN: [id number] Arrival date & time: 08/10/21  2049     History Chief Complaint  Patient presents with   Cough    Laura Gutierrez is a 35 y.o. female.  The history is provided by the patient.  Cough Cough characteristics:  Productive Sputum characteristics:  Yellow Severity:  Moderate Onset quality:  Gradual Duration:  3 weeks Timing:  Intermittent Progression:  Worsening Chronicity:  New Smoker: yes   Relieved by:  Nothing Worsened by:  Activity and lying down Associated symptoms: shortness of breath   Associated symptoms: no chest pain, no fever and no myalgias   Patient history of hypertension, obesity presents with cough and shortness of breath.  Patient reports he has had cough for several weeks.  She reports over the past 5 days she has had increasing shortness of breath.  She reports she has cut back on smoking without any improvement. No fevers or vomiting.  No active chest pain.  No hemoptysis.  She reports nasal congestion Child at home without RSV.  Patient has taken COVID-19 testing that is negative     Past Medical History:  Diagnosis Date   Bronchitis    Gestational diabetes    Gonorrhea    Hypertension    Morbid obesity (HCC)    Obesity    Syphilis     Patient Active Problem List   Diagnosis Date Noted   Encounter for postpartum visit 09/10/2020   Initiation of oral contraception 09/10/2020   Depressed mood 09/10/2020   Vaginal delivery 07/13/2020   Second degree perineal laceration 07/13/2020   Encounter for induction of labor 07/12/2020   COVID-19 affecting pregnancy in third trimester 06/20/2020   Gestational diabetes 05/09/2020   Chronic pre-existing hypertension during pregnancy 03/25/2020   Obesity affecting pregnancy, antepartum 03/25/2020   Supervision of high risk pregnancy, antepartum 01/08/2020   Hypertension 12/23/2016   Obesity with serious comorbidity 12/23/2016    Past  Surgical History:  Procedure Laterality Date   PERINEAL LACERATION REPAIR N/A 07/13/2020   Procedure: SUTURE REPAIR PERINEAL LACERATION;  Surgeon: Tereso Newcomer, MD;  Location: [facility] LD ORS;  Service: Gynecology;  Laterality: N/A;   TONSILLECTOMY       OB History     Gravida  1   Para  1   Term  1   Preterm      AB      Living  1      SAB      IAB      Ectopic      Multiple  0   Live Births  1           Family History  Problem Relation Age of Onset   Hypertension Mother    Alzheimer's disease Maternal Aunt    Heart disease Maternal Grandfather    Diabetes Paternal Grandmother     Social History   Tobacco Use   Smoking status: Every Day    Packs/day: 0.25    Types: Cigarettes   Smokeless tobacco: Never  Vaping Use   Vaping Use: Never used  Substance Use Topics   Alcohol use: Yes    Alcohol/week: 1.0 standard drink    Types: 1 Glasses of wine per week    Comment: sometimes     Drug use: No    Home Medications Prior to Admission medications   Medication Sig Start Date End Date Taking? Authorizing Provider  predniSONE (DELTASONE) 50 MG tablet Take  1 tablet (50 mg total) by mouth daily with breakfast. 08/11/21  Yes Zadie Rhine, MD  amLODipine (NORVASC) 10 MG tablet Take 1 tablet (10 mg total) by mouth daily. 01/23/21   Grayce Sessions, NP  carvedilol (COREG) 12.5 MG tablet Take 1 tablet (12.5 mg total) by mouth 2 (two) times daily with a meal. 01/23/21   Grayce Sessions, NP  fexofenadine-pseudoephedrine (ALLEGRA-D 24) 180-240 MG 24 hr tablet Take 1 tablet by mouth daily. 03/18/21   Grayce Sessions, NP  fluticasone (FLONASE) 50 MCG/ACT nasal spray Place 2 sprays into both nostrils daily. 03/18/21   Grayce Sessions, NP  losartan-hydrochlorothiazide (HYZAAR) 100-25 MG tablet Take 1 tablet by mouth daily. 03/18/21   Grayce Sessions, NP  triamcinolone cream (KENALOG) 0.1 % Apply 1 application topically 2 (two) times daily. 03/18/21    Grayce Sessions, NP    Allergies    Bactrim [sulfamethoxazole-trimethoprim] and Latex  Review of Systems   Review of Systems  Constitutional:  Negative for fever.  HENT:  Positive for congestion.   Respiratory:  Positive for cough and shortness of breath.   Cardiovascular:  Negative for chest pain and leg swelling.  Gastrointestinal:  Negative for vomiting.  Musculoskeletal:  Negative for myalgias.  All other systems reviewed and are negative.  Physical Exam Updated Vital Signs BP (!) 181/111 (BP Location: Right Arm)   Pulse 100   Temp 98.2 F (36.8 C) (Oral)   Resp 16   Ht 1.676 m (5\' 6" )   Wt (!) 171 kg   SpO2 99%   BMI 60.85 kg/m   Physical Exam CONSTITUTIONAL: Well developed/well nourished HEAD: Normocephalic/atraumatic EYES: EOMI/PERRL ENMT: Mucous membranes moist, nasal congestion, uvula midline no erythema or exudate NECK: supple no meningeal signs SPINE/BACK:entire spine nontender CV: S1/S2 noted, no murmurs/rubs/gallops noted LUNGS: Lungs are clear to auscultation bilaterally, no apparent distress ABDOMEN: soft, nontender, obese GU:no cva tenderness NEURO: Pt is awake/alert/appropriate, moves all extremitiesx4.  No facial droop.   EXTREMITIES: pulses normal/equal, full ROM, no lower extremity edema SKIN: warm, color normal PSYCH: no abnormalities of mood noted, alert and oriented to situation  ED Results / Procedures / Treatments   Labs (all labs ordered are listed, but only abnormal results are displayed) Labs Reviewed  RESP PANEL BY RT-PCR (FLU A&B, COVID) [id number]    EKG None  Radiology DG Chest 2 View  Result Date: 08/11/2021 CLINICAL DATA:  Cough EXAM: CHEST - 2 VIEW COMPARISON:  None. FINDINGS: Lungs are clear.  No pleural effusion or pneumothorax. The heart is normal in size. Visualized osseous structures are within normal limits. IMPRESSION: Normal chest radiographs. Electronically Signed   By: 13/05/2021 M.D.   On: 08/11/2021 01:49     Procedures Procedures   Medications Ordered in ED Medications  predniSONE (DELTASONE) tablet 60 mg (has no administration in time range)  albuterol (VENTOLIN HFA) 108 (90 Base) MCG/ACT inhaler 2 puff (2 puffs Inhalation Given 08/11/21 0127)    ED Course  I have reviewed the triage vital signs and the nursing notes.  Pertinent labs & imaging results that were available during my care of the patient were reviewed by me and considered in my medical decision making (see chart for details).    MDM Rules/Calculators/A&P                           Patient reports cough for several weeks and worsening shortness of breath over the  past several days.  She does report that her child has RSV and she seemed to get worse after this exposure. Patient is a smoker and admits that this could be worsening her symptoms.  Overall patient is well-appearing.  She felt improved after albuterol.  I personally reviewed the chest x-ray and is negative.  Low suspicion for ACS/PE/CHf Patient request prescription for prednisone. We discussed need to quit smoking.  Final Clinical Impression(s) / ED Diagnoses Final diagnoses:  Acute bronchitis, unspecified organism    Rx / DC Orders ED Discharge Orders          Ordered    predniSONE (DELTASONE) 50 MG tablet  Daily with breakfast        08/11/21 0225             Zadie Rhine, MD 08/11/21 0230

## 2021-08-12 ENCOUNTER — Telehealth (INDEPENDENT_AMBULATORY_CARE_PROVIDER_SITE_OTHER): Payer: Self-pay | Admitting: Primary Care

## 2021-08-12 NOTE — Telephone Encounter (Signed)
Patient called in requesting a nebulizer. Please call back

## 2021-08-12 NOTE — Telephone Encounter (Signed)
Requested routed to PCP.

## 2021-08-13 ENCOUNTER — Other Ambulatory Visit (INDEPENDENT_AMBULATORY_CARE_PROVIDER_SITE_OTHER): Payer: Self-pay | Admitting: Primary Care

## 2021-08-13 DIAGNOSIS — E662 Morbid (severe) obesity with alveolar hypoventilation: Secondary | ICD-10-CM

## 2021-08-14 NOTE — Telephone Encounter (Signed)
Left message asking patient to return call to [facility] at [phone].  

## 2021-10-24 ENCOUNTER — Encounter (HOSPITAL_BASED_OUTPATIENT_CLINIC_OR_DEPARTMENT_OTHER): Payer: Self-pay

## 2021-10-24 ENCOUNTER — Emergency Department (HOSPITAL_BASED_OUTPATIENT_CLINIC_OR_DEPARTMENT_OTHER)
Admission: EM | Admit: 2021-10-24 | Discharge: 2021-10-25 | Disposition: A | Discharge status: Home / Self Care | Payer: Medicaid Other | Attending: Emergency Medicine | Admitting: Emergency Medicine

## 2021-10-24 ENCOUNTER — Other Ambulatory Visit: Payer: Self-pay

## 2021-10-24 ENCOUNTER — Encounter (INDEPENDENT_AMBULATORY_CARE_PROVIDER_SITE_OTHER): Payer: Self-pay | Admitting: Primary Care

## 2021-10-24 DIAGNOSIS — N939 Abnormal uterine and vaginal bleeding, unspecified: Secondary | ICD-10-CM | POA: Insufficient documentation

## 2021-10-24 DIAGNOSIS — Z91040 Latex allergy status: Secondary | ICD-10-CM | POA: Diagnosis not present

## 2021-10-24 LAB — URINALYSIS, ROUTINE W REFLEX MICROSCOPIC
Glucose, UA: NEGATIVE mg/dL
Ketones, ur: 15 mg/dL — AB
Nitrite: NEGATIVE
Protein, ur: >300 mg/dL — AB
RBC / HPF: >50 RBC/hpf — ABNORMAL HIGH (ref 0–5)
Specific Gravity, Urine: >1.046 — ABNORMAL HIGH (ref 1.005–1.030)
pH: 6 (ref 5.0–8.0)

## 2021-10-24 LAB — PREGNANCY, URINE: Preg Test, Ur: NEGATIVE

## 2021-10-24 NOTE — ED Triage Notes (Signed)
Patient here POV from Home with Vaginal Bleeding.  Patient states she has begun her Menstrual Cycle 10/17/21. Cycle has however continued and Patient is now having Large Clots that are also abnormal. Patient also endorses Pain to lower Pelvic Area.  No Fevers. No Nausea. No Vomiting.  NAD Noted during Triage. A&Ox4. GCS 15. Ambulatory.

## 2021-10-24 NOTE — ED Notes (Signed)
Patient called Once more for Triage with no Response. Patient not visualized in ED Waiting Area and will be discharged accordingly.

## 2021-10-24 NOTE — ED Notes (Signed)
Patient called Once for Triage with no Response. 

## 2021-10-25 LAB — WET PREP, GENITAL
Clue Cells Wet Prep HPF POC: NONE SEEN
Sperm: NONE SEEN
Trich, Wet Prep: NONE SEEN
WBC, Wet Prep HPF POC: <10 (ref <10)
Yeast Wet Prep HPF POC: NONE SEEN

## 2021-10-25 LAB — CBC WITH DIFFERENTIAL/PLATELET
Abs Immature Granulocytes: 0.03 10*3/uL (ref 0.00–0.07)
Basophils Absolute: 0.1 10*3/uL (ref 0.0–0.1)
Basophils Relative: 1 %
Eosinophils Absolute: 0.1 10*3/uL (ref 0.0–0.5)
Eosinophils Relative: 1 %
HCT: 32.1 % — ABNORMAL LOW (ref 36.0–46.0)
Hemoglobin: 8.7 g/dL — ABNORMAL LOW (ref 12.0–15.0)
Immature Granulocytes: 0 %
Lymphocytes Relative: 41 %
Lymphs Abs: 4.7 10*3/uL — ABNORMAL HIGH (ref 0.7–4.0)
MCH: 20.7 pg — ABNORMAL LOW (ref 26.0–34.0)
MCHC: 27.1 g/dL — ABNORMAL LOW (ref 30.0–36.0)
MCV: 76.2 fL — ABNORMAL LOW (ref 80.0–100.0)
Monocytes Absolute: 0.8 10*3/uL (ref 0.1–1.0)
Monocytes Relative: 7 %
Neutro Abs: 5.8 10*3/uL (ref 1.7–7.7)
Neutrophils Relative %: 50 %
Platelets: 328 10*3/uL (ref 150–400)
RBC: 4.21 MIL/uL (ref 3.87–5.11)
RDW: 19.2 % — ABNORMAL HIGH (ref 11.5–15.5)
WBC: 11.5 10*3/uL — ABNORMAL HIGH (ref 4.0–10.5)
nRBC: 0 % (ref 0.0–0.2)

## 2021-10-25 LAB — BASIC METABOLIC PANEL
Anion gap: 7 (ref 5–15)
BUN: 18 mg/dL (ref 6–20)
CO2: 26 mmol/L (ref 22–32)
Calcium: 8.7 mg/dL — ABNORMAL LOW (ref 8.9–10.3)
Chloride: 105 mmol/L (ref 98–111)
Creatinine, Ser: 0.79 mg/dL (ref 0.44–1.00)
GFR, Estimated: >60 mL/min (ref >60)
Glucose, Bld: 106 mg/dL — ABNORMAL HIGH (ref 70–99)
Potassium: 3.9 mmol/L (ref 3.5–5.1)
Sodium: 138 mmol/L (ref 135–145)

## 2021-10-25 LAB — HIV ANTIBODY (ROUTINE TESTING W REFLEX): HIV Screen 4th Generation wRfx: NONREACTIVE

## 2021-10-25 MED ORDER — IBUPROFEN 800 MG PO TABS
800.0000 mg | ORAL_TABLET | Freq: Once | ORAL | Status: AC
  Administered 2021-10-25: 800 mg via ORAL
  Filled 2021-10-25: qty 1

## 2021-10-25 NOTE — ED Provider Notes (Signed)
[facility] EMERGENCY DEPT Provider Note   CSN: [id number] Arrival date & time: 10/24/21  1834     History  Chief Complaint  Patient presents with   Vaginal Bleeding    Laura Gutierrez is a 36 y.o. female.  Presents to ER with concern for vaginal bleeding.  Patient states that menstrual cycle began on 1/14.  States that initially seem to be more heavy than normal, but bleeding has seemed to slow down more recently but still having bleeding which is much longer than normal.  Does not have a any lightheadedness or shortness of breath, no syncope.  She has occasionally noted some lower abdominal cramping.  Currently does not have any pain.  No vaginal discharge.  Does want STD testing.  HPI     Home Medications Prior to Admission medications   Medication Sig Start Date End Date Taking? Authorizing Provider  amLODipine (NORVASC) 10 MG tablet Take 1 tablet (10 mg total) by mouth daily. 01/23/21   Grayce Sessions, NP  carvedilol (COREG) 12.5 MG tablet Take 1 tablet (12.5 mg total) by mouth 2 (two) times daily with a meal. 01/23/21   Grayce Sessions, NP  fexofenadine-pseudoephedrine (ALLEGRA-D 24) 180-240 MG 24 hr tablet Take 1 tablet by mouth daily. 03/18/21   Grayce Sessions, NP  fluticasone (FLONASE) 50 MCG/ACT nasal spray Place 2 sprays into both nostrils daily. 03/18/21   Grayce Sessions, NP  losartan-hydrochlorothiazide (HYZAAR) 100-25 MG tablet Take 1 tablet by mouth daily. 03/18/21   Grayce Sessions, NP  predniSONE (DELTASONE) 50 MG tablet Take 1 tablet (50 mg total) by mouth daily with breakfast. 08/11/21   Zadie Rhine, MD  triamcinolone cream (KENALOG) 0.1 % Apply 1 application topically 2 (two) times daily. 03/18/21   Grayce Sessions, NP      Allergies    Bactrim [sulfamethoxazole-trimethoprim] and Latex    Review of Systems   Review of Systems  Constitutional:  Negative for chills and fever.  HENT:  Negative for ear pain and sore throat.    Eyes:  Negative for pain and visual disturbance.  Respiratory:  Negative for cough and shortness of breath.   Cardiovascular:  Negative for chest pain and palpitations.  Gastrointestinal:  Negative for abdominal pain and vomiting.  Genitourinary:  Positive for vaginal bleeding. Negative for dysuria and hematuria.  Musculoskeletal:  Negative for arthralgias and back pain.  Skin:  Negative for color change and rash.  Neurological:  Negative for seizures and syncope.  All other systems reviewed and are negative.  Physical Exam Updated Vital Signs BP (!) 142/75    Pulse 93    Temp 98.3 F (36.8 C) (Oral)    Resp 16    Ht 5\' 6"  (1.676 m)    Wt (!) 171 kg    SpO2 99%    BMI 60.85 kg/m  Physical Exam Vitals and nursing note reviewed. Exam conducted with a chaperone present.  Constitutional:      General: She is not in acute distress.    Appearance: She is well-developed.  HENT:     Head: Normocephalic and atraumatic.  Eyes:     Conjunctiva/sclera: Conjunctivae normal.  Cardiovascular:     Rate and Rhythm: Normal rate.     Pulses: Normal pulses.  Pulmonary:     Effort: Pulmonary effort is normal. No respiratory distress.  Abdominal:     Palpations: Abdomen is soft.     Tenderness: There is no abdominal tenderness.  Genitourinary:  Comments: Grossly normal external exam, cervix appears normal, small blood in vaginal vault, no pooling Musculoskeletal:        General: No swelling.     Cervical back: Neck supple.  Skin:    General: Skin is warm and dry.     Capillary Refill: Capillary refill takes less than 2 seconds.  Neurological:     Mental Status: She is alert.  Psychiatric:        Mood and Affect: Mood normal.    ED Results / Procedures / Treatments   Labs (all labs ordered are listed, but only abnormal results are displayed) Labs Reviewed  URINALYSIS, ROUTINE W REFLEX MICROSCOPIC - Abnormal; Notable for the following components:      Result Value   APPearance HAZY  (*)    Specific Gravity, Urine >1.046 (*)    Hgb urine dipstick LARGE (*)    Bilirubin Urine SMALL (*)    Ketones, ur 15 (*)    Protein, ur >300 (*)    Leukocytes,Ua MODERATE (*)    RBC / HPF >50 (*)    Bacteria, UA RARE (*)    All other components within normal limits  CBC WITH DIFFERENTIAL/PLATELET - Abnormal; Notable for the following components:   WBC 11.5 (*)    Hemoglobin 8.7 (*)    HCT 32.1 (*)    MCV 76.2 (*)    MCH 20.7 (*)    MCHC 27.1 (*)    RDW 19.2 (*)    Lymphs Abs 4.7 (*)    All other components within normal limits  BASIC METABOLIC PANEL - Abnormal; Notable for the following components:   Glucose, Bld 106 (*)    Calcium 8.7 (*)    All other components within normal limits  WET PREP, GENITAL  PREGNANCY, URINE  HIV ANTIBODY (ROUTINE TESTING W REFLEX)  RPR  GC/CHLAMYDIA PROBE AMP ([place]) NOT AT [facility]    EKG None  Radiology No results found.  Procedures Procedures    Medications Ordered in ED Medications  ibuprofen (ADVIL) tablet 800 mg (800 mg Oral Given 10/25/21 0228)    ED Course/ Medical Decision Making/ A&P                           Medical Decision Making Amount and/or Complexity of Data Reviewed Labs: ordered.  Risk Prescription drug management.   36 year old lady presented to ER with concern for prolonged menstrual cycle.  On exam she appears well in no distress, check basic labs, noted slight decrease in hemoglobin.  Reviewed prior hemoglobin levels, patient normally ranges 9-10.  Today it is 8.7.  She does not have any systemic symptoms.  No pooling on speculum exam.  For her current symptoms, recommended trial of NSAIDs, follow-up with PCP and gynecology.  Reviewed return precautions and discharged home.  Additional history was obtained from chart review, review of past laboratory values.        Final Clinical Impression(s) / ED Diagnoses Final diagnoses:  Vaginal bleeding    Rx / DC Orders ED Discharge Orders      None         Lucrezia Starch, MD 10/25/21 2312

## 2021-10-25 NOTE — ED Notes (Signed)
Pelvic cart setup and placed at bedside

## 2021-10-25 NOTE — Discharge Instructions (Addendum)
Follow-up with your gynecologist and your primary care doctor.  Recommend taking anti-inflammatories such as naproxen or Motrin for pain.  This can also help with bleeding.  If you develop worsening pain and bleeding, any lightheadedness, episodes of passing out or shortness of breath, come back to ER for reassessment.

## 2021-10-26 LAB — GC/CHLAMYDIA PROBE AMP (~~LOC~~) NOT AT ARMC
Chlamydia: NEGATIVE
Comment: NEGATIVE
Comment: NORMAL
Neisseria Gonorrhea: POSITIVE — AB

## 2021-10-26 LAB — RPR
RPR Ser Ql: REACTIVE — AB
RPR Titer: 1:1 {titer}

## 2021-10-27 ENCOUNTER — Ambulatory Visit (INDEPENDENT_AMBULATORY_CARE_PROVIDER_SITE_OTHER): Payer: Self-pay | Admitting: *Deleted

## 2021-10-27 ENCOUNTER — Encounter (INDEPENDENT_AMBULATORY_CARE_PROVIDER_SITE_OTHER): Payer: Self-pay | Admitting: Primary Care

## 2021-10-27 ENCOUNTER — Telehealth (INDEPENDENT_AMBULATORY_CARE_PROVIDER_SITE_OTHER): Payer: Self-pay | Admitting: Primary Care

## 2021-10-27 LAB — T.PALLIDUM AB, TOTAL: T Pallidum Abs: REACTIVE — AB

## 2021-10-27 NOTE — Telephone Encounter (Addendum)
Pharmacy: [facility] Chief Complaint: request medication Symptoms:  GC- 1/22 lab Frequency:  Pertinent Negatives: Patient denies  Disposition: [] ED /[] Urgent Care (no appt availability in office) / [] Appointment(In office/virtual)/ []  [place] [facility]/ [] Home Care/ [] Refused Recommended Disposition /[] [place] [facility]/ []  Follow-up with PCP Additional Notes: Patient has appointment 2/2- in office. Patient is requesting treatment for + GC- she was not treated at ED

## 2021-10-27 NOTE — Telephone Encounter (Signed)
Summary: Pt was tested in hospital for STD, positive needs meds/no acute appt   Pt went into hospital on 21st for heavy bleeding,  sent home, at hospital  tested for STD and now has just seen results on MyChart  as positive and wants medication now and earlier visit by [place] than Feb. [phone]      Reason for Disposition  [1] Follow-up call from patient regarding patient's clinical status AND [2] information NON-URGENT  Answer Assessment - Initial Assessment Questions 1. REASON FOR CALL or QUESTION: "What is your reason for calling today?" or "How can I best help you?" or "What question do you have that I can help answer?"     Patient was seen in ED ( 1/22)for abnormal bleeding- she tested + for gonorrhea. Patient was not treated at ED and would like PCP to treat her. She does have upcoming appointment. Patient advised STD precautions- no intercourse until treated 2. CALLER: Document the source of call. (e.g., laboratory, patient).     Patient calling  Protocols used: PCP Call - No Triage-A-AH

## 2021-10-27 NOTE — Telephone Encounter (Signed)
error 

## 2021-10-27 NOTE — Telephone Encounter (Signed)
Pt requesting Rx to treat for Gonorrhea. Results were not available while in ED.

## 2021-10-28 NOTE — Telephone Encounter (Signed)
Called patient informed unable to call in meds for tx an in office visit has to be done for tx and documentation for the health dept. Patient was upset waited all day for this information. Explained seeing patient. She [place] scheduled 9:30 10/29/21

## 2021-10-29 ENCOUNTER — Ambulatory Visit (INDEPENDENT_AMBULATORY_CARE_PROVIDER_SITE_OTHER): Payer: Medicaid Other | Admitting: Primary Care

## 2021-10-29 ENCOUNTER — Encounter (INDEPENDENT_AMBULATORY_CARE_PROVIDER_SITE_OTHER): Payer: Self-pay | Admitting: Primary Care

## 2021-10-29 ENCOUNTER — Other Ambulatory Visit: Payer: Self-pay

## 2021-10-29 VITALS — BP 141/91 | HR 85 | Temp 97.8°F | Ht 66.0 in | Wt 357.0 lb

## 2021-10-29 DIAGNOSIS — A549 Gonococcal infection, unspecified: Secondary | ICD-10-CM | POA: Diagnosis not present

## 2021-10-29 MED ORDER — DOXYCYCLINE HYCLATE 100 MG PO TABS: 100.0000 mg | ORAL_TABLET | Freq: Two times a day (BID) | ORAL | 0 refills | Status: DC

## 2021-10-29 MED ORDER — CEFTRIAXONE SODIUM 500 MG IJ SOLR: 500.0000 mg | Freq: Once | INTRAMUSCULAR | Status: DC

## 2021-10-29 MED ORDER — FLUCONAZOLE 150 MG PO TABS: 150.0000 mg | ORAL_TABLET | Freq: Once | ORAL | 0 refills | Status: AC

## 2021-10-29 MED ORDER — CEFTRIAXONE SODIUM 500 MG IJ SOLR
1000.0000 mg | Freq: Once | INTRAMUSCULAR | Status: AC
  Administered 2021-10-29: 1000 mg via INTRAMUSCULAR

## 2021-10-29 NOTE — Progress Notes (Signed)
[facility]   History of Present Illness   Patient Identification Laura Gutierrez is a 36 y.o. female.  Patient information was obtained from patient./labs History/Exam limitations: none.  Chief Complaint  Exposure to STD (And treatment)  Patient presents with a complaint of vaginal discharge. Onset of symptoms was abrupt starting 1 week ago, and has been gradually worsening since that time. The patient describes the heavy abnormal menstrual cycle experience abdominal discomfort with the discharge. She does not complain of burning with urination. She denies genital lesions at this time. The patient does  have a history of STD's or PID and denies multiple sexual partners. She is homosexual.  Past Medical History:  Diagnosis Date   Bronchitis    Gestational diabetes    Gonorrhea    Hypertension    Morbid obesity (HCC)    Obesity    Syphilis    Family History  Problem Relation Age of Onset   Hypertension Mother    Alzheimer's disease Maternal Aunt    Heart disease Maternal Grandfather    Diabetes Paternal Grandmother    Scheduled Meds:  cefTRIAXone  500 mg Intramuscular Once   Continuous Infusions: PRN Meds:  Allergies  Allergen Reactions   Bactrim [Sulfamethoxazole-Trimethoprim] Anaphylaxis   Latex     itching   Social History   Socioeconomic History   Marital status: Single    Spouse name: Not on file   Number of children: Not on file   Years of education: Not on file   Highest education level: Not on file  Occupational History   Not on file  Tobacco Use   Smoking status: Every Day    Packs/day: 0.25    Types: Cigarettes   Smokeless tobacco: Never  Vaping Use   Vaping Use: Never used  Substance and Sexual Activity   Alcohol use: Yes    Alcohol/week: 1.0 standard drink    Types: 1 Glasses of wine per week    Comment: sometimes     Drug use: No   Sexual activity: Not Currently    Birth control/protection: None  Other Topics Concern    Not on file  Social History Narrative   Not on file   Social Determinants of Health   Financial Resource Strain: Not on file  Food Insecurity: Not on file  Transportation Needs: Not on file  Physical Activity: Not on file  Stress: Not on file  Social Connections: Not on file  Intimate Partner Violence: Not on file   Review of Systems Pertinent items noted in HPI and remainder of comprehensive ROS otherwise negative.   Physical Exam   BP (!) 141/91 (BP Location: Right Arm, Patient Position: Sitting, Cuff Size: Large)    Pulse 85    Temp 97.8 F (36.6 C) (Oral)    Ht 5\' 6"  (1.676 m)    Wt (!) 357 lb (161.9 kg)    LMP 10/17/2021 (Exact Date)    SpO2 96%    Breastfeeding No    BMI 57.62 kg/m   Physical exam: General: Vital signs reviewed.  Patient is well-developed and well-nourished, morbid obese  in no acute distress and cooperative with exam. Head: Normocephalic and atraumatic. Eyes: EOMI, conjunctivae normal, no scleral icterus. Neck: Supple, trachea midline, normal ROM, no JVD, masses, thyromegaly, or carotid bruit present. Cardiovascular: RRR, S1 normal, S2 normal, no murmurs, gallops, or rubs. Pulmonary/Chest: Clear to auscultation bilaterally, no wheezes, rales, or rhonchi. Abdominal: Soft, non-tender, non-distended, BS +, no masses, organomegaly, or  guarding present. Musculoskeletal: No joint deformities, erythema, or stiffness, ROM full and nontender. Extremities: No lower extremity edema bilaterally,  pulses symmetric and intact bilaterally. No cyanosis or clubbing. Neurological: A&O x3, Strength is normal Skin: Warm, dry and intact. No rashes or erythema. Psychiatric: Normal mood and affect. speech and behavior is normal. Cognition and memory are normal.     Studies: Lab: Culture (+ gonorrhea ) - abnormal  Records Reviewed: Old medical records.  [place] was seen today for exposure to std.  Diagnoses and all orders for this visit:  Gonorrhea -     cefTRIAXone  (ROCEPHIN) injection 500 mg -     doxycycline (VIBRA-TABS) 100 MG tablet; Take 1 tablet (100 mg total) by mouth 2 (two) times daily. -     fluconazole (DIFLUCAN) 150 MG tablet; Take 1 tablet (150 mg total) by mouth once for 1 dose.  This note has been created with Education officer, environmental. Any transcriptional errors are unintentional.  Grayce Sessions, NP 10/29/2021

## 2021-11-05 ENCOUNTER — Ambulatory Visit (INDEPENDENT_AMBULATORY_CARE_PROVIDER_SITE_OTHER): Payer: Medicaid Other | Admitting: Primary Care

## 2021-11-16 ENCOUNTER — Ambulatory Visit (INDEPENDENT_AMBULATORY_CARE_PROVIDER_SITE_OTHER): Payer: Medicaid Other | Admitting: Primary Care

## 2022-03-04 ENCOUNTER — Other Ambulatory Visit (HOSPITAL_COMMUNITY)
Admission: RE | Admit: 2022-03-04 | Discharge: 2022-03-04 | Disposition: A | Discharge status: Home / Self Care | Payer: Medicaid Other | Source: Ambulatory Visit | Attending: Primary Care | Admitting: Primary Care

## 2022-03-04 ENCOUNTER — Encounter (INDEPENDENT_AMBULATORY_CARE_PROVIDER_SITE_OTHER): Payer: Self-pay | Admitting: Primary Care

## 2022-03-04 ENCOUNTER — Ambulatory Visit (INDEPENDENT_AMBULATORY_CARE_PROVIDER_SITE_OTHER): Payer: Medicaid Other | Admitting: Primary Care

## 2022-03-04 VITALS — BP 155/105 | HR 94 | Temp 98.0°F | Ht 66.0 in | Wt 361.8 lb

## 2022-03-04 DIAGNOSIS — Z113 Encounter for screening for infections with a predominantly sexual mode of transmission: Secondary | ICD-10-CM | POA: Diagnosis not present

## 2022-03-04 DIAGNOSIS — I10 Essential (primary) hypertension: Secondary | ICD-10-CM | POA: Diagnosis not present

## 2022-03-04 DIAGNOSIS — R7303 Prediabetes: Secondary | ICD-10-CM

## 2022-03-04 DIAGNOSIS — Z760 Encounter for issue of repeat prescription: Secondary | ICD-10-CM

## 2022-03-04 LAB — POCT GLYCOSYLATED HEMOGLOBIN (HGB A1C): Hemoglobin A1C: 6.2 % — AB (ref 4.0–5.6)

## 2022-03-04 MED ORDER — CARVEDILOL 12.5 MG PO TABS: 12.5000 mg | ORAL_TABLET | Freq: Two times a day (BID) | ORAL | 3 refills | Status: DC

## 2022-03-04 MED ORDER — LOSARTAN POTASSIUM-HCTZ 100-25 MG PO TABS: 1.0000 | ORAL_TABLET | Freq: Every day | ORAL | 3 refills | Status: DC

## 2022-03-04 MED ORDER — AMLODIPINE BESYLATE 10 MG PO TABS: 10.0000 mg | ORAL_TABLET | Freq: Every day | ORAL | 3 refills | Status: DC

## 2022-03-04 NOTE — Patient Instructions (Signed)
Hypertension, Adult High blood pressure (hypertension) is when the force of blood pumping through the arteries is too strong. The arteries are the blood vessels that carry blood from the heart throughout the body. Hypertension forces the heart to work harder to pump blood and may cause arteries to become narrow or stiff. Untreated or uncontrolled hypertension can lead to a heart attack, heart failure, a stroke, kidney disease, and other problems. A blood pressure reading consists of a higher number over a lower number. Ideally, your blood pressure should be below 120/80. The first ("top") number is called the systolic pressure. It is a measure of the pressure in your arteries as your heart beats. The second ("bottom") number is called the diastolic pressure. It is a measure of the pressure in your arteries as the heart relaxes. What are the causes? The exact cause of this condition is not known. There are some conditions that result in high blood pressure. What increases the risk? Certain factors may make you more likely to develop high blood pressure. Some of these risk factors are under your control, including: Smoking. Not getting enough exercise or physical activity. Being overweight. Having too much fat, sugar, calories, or salt (sodium) in your diet. Drinking too much alcohol. Other risk factors include: Having a personal history of heart disease, diabetes, high cholesterol, or kidney disease. Stress. Having a family history of high blood pressure and high cholesterol. Having obstructive sleep apnea. Age. The risk increases with age. What are the signs or symptoms? High blood pressure may not cause symptoms. Very high blood pressure (hypertensive crisis) may cause: Headache. Fast or irregular heartbeats (palpitations). Shortness of breath. Nosebleed. Nausea and vomiting. Vision changes. Severe chest pain, dizziness, and seizures. How is this diagnosed? This condition is diagnosed by  measuring your blood pressure while you are seated, with your arm resting on a flat surface, your legs uncrossed, and your feet flat on the floor. The cuff of the blood pressure monitor will be placed directly against the skin of your upper arm at the level of your heart. Blood pressure should be measured at least twice using the same arm. Certain conditions can cause a difference in blood pressure between your right and left arms. If you have a high blood pressure reading during one visit or you have normal blood pressure with other risk factors, you may be asked to: Return on a different day to have your blood pressure checked again. Monitor your blood pressure at home for 1 week or longer. If you are diagnosed with hypertension, you may have other blood or imaging tests to help your health care provider understand your overall risk for other conditions. How is this treated? This condition is treated by making healthy lifestyle changes, such as eating healthy foods, exercising more, and reducing your alcohol intake. You may be referred for counseling on a healthy diet and physical activity. Your health care provider may prescribe medicine if lifestyle changes are not enough to get your blood pressure under control and if: Your systolic blood pressure is above 130. Your diastolic blood pressure is above 80. Your personal target blood pressure may vary depending on your medical conditions, your age, and other factors. Follow these instructions at home: Eating and drinking  Eat a diet that is high in fiber and potassium, and low in sodium, added sugar, and fat. An example of this eating plan is called the DASH diet. DASH stands for Dietary Approaches to Stop Hypertension. To eat this way: Eat   plenty of fresh fruits and vegetables. Try to fill one half of your plate at each meal with fruits and vegetables. Eat whole grains, such as whole-wheat pasta, brown rice, or whole-grain bread. Fill about one  fourth of your plate with whole grains. Eat or drink low-fat dairy products, such as skim milk or low-fat yogurt. Avoid fatty cuts of meat, processed or cured meats, and poultry with skin. Fill about one fourth of your plate with lean proteins, such as fish, chicken without skin, beans, eggs, or tofu. Avoid pre-made and processed foods. These tend to be higher in sodium, added sugar, and fat. Reduce your daily sodium intake. Many people with hypertension should eat less than 1,500 mg of sodium a day. Do not drink alcohol if: Your health care provider tells you not to drink. You are pregnant, may be pregnant, or are planning to become pregnant. If you drink alcohol: Limit how much you have to: 0-1 drink a day for women. 0-2 drinks a day for men. Know how much alcohol is in your drink. In the [place] one drink equals one 12 oz bottle of beer (355 mL), one 5 oz glass of wine (148 mL), or one 1 oz glass of hard liquor (44 mL). Lifestyle  Work with your health care provider to maintain a healthy body weight or to lose weight. Ask what an ideal weight is for you. Get at least 30 minutes of exercise that causes your heart to beat faster (aerobic exercise) most days of the week. Activities may include walking, swimming, or biking. Include exercise to strengthen your muscles (resistance exercise), such as Pilates or lifting weights, as part of your weekly exercise routine. Try to do these types of exercises for 30 minutes at least 3 days a week. Do not use any products that contain nicotine or tobacco. These products include cigarettes, chewing tobacco, and vaping devices, such as e-cigarettes. If you need help quitting, ask your health care provider. Monitor your blood pressure at home as told by your health care provider. Keep all follow-up visits. This is important. Medicines Take over-the-counter and prescription medicines only as told by your health care provider. Follow directions carefully. Blood  pressure medicines must be taken as prescribed. Do not skip doses of blood pressure medicine. Doing this puts you at risk for problems and can make the medicine less effective. Ask your health care provider about side effects or reactions to medicines that you should watch for. Contact a health care provider if you: Think you are having a reaction to a medicine you are taking. Have headaches that keep coming back (recurring). Feel dizzy. Have swelling in your ankles. Have trouble with your vision. Get help right away if you: Develop a severe headache or confusion. Have unusual weakness or numbness. Feel faint. Have severe pain in your chest or abdomen. Vomit repeatedly. Have trouble breathing. These symptoms may be an emergency. Get help right away. Call 911. Do not wait to see if the symptoms will go away. Do not drive yourself to the hospital. Summary Hypertension is when the force of blood pumping through your arteries is too strong. If this condition is not controlled, it may put you at risk for serious complications. Your personal target blood pressure may vary depending on your medical conditions, your age, and other factors. For most people, a normal blood pressure is less than 120/80. Hypertension is treated with lifestyle changes, medicines, or a combination of both. Lifestyle changes include losing weight, eating a healthy,   low-sodium diet, exercising more, and limiting alcohol. This information is not intended to replace advice given to you by your health care provider. Make sure you discuss any questions you have with your health care provider. Document Revised: 07/28/2021 Document Reviewed: 07/28/2021 Elsevier Patient Education  2023 [employer]  

## 2022-03-04 NOTE — Progress Notes (Signed)
[facility]        History of Present Illness   Patient Identification Laura Gutierrez is a 36 y.o. female.  Patient information was obtained from patient. History/Exam limitations: none. Chief Complaint  STD testing and Medication Refill   Patient presents with a complaint of vaginal discharge. Onset of symptoms was 14 days, and has been unchanged since that time. The patient describes the discharge as normal and physiologic and does not experience abdominal discomfort with the discharge. She does not complain of burning with urination. She denies genital lesions at this time. The patient does  have a history of STD's or PID and denies multiple sexual partners. She is heterosexual The patient is HIV negative.  She also presents with a elevated blood pressure and has not been taking medications due to the fact of affordability.  Explained to her if the PCP needed to the medications could have been changes so her blood pressure can be controlled and decrease her risk of stroke and heart attack. Patient has No headache, No chest pain, No abdominal pain - No Nausea, No new weakness tingling or numbness, No Cough - shortness of breath  Past Medical History:  Diagnosis Date   Bronchitis    Gestational diabetes    Gonorrhea    Hypertension    Morbid obesity (HCC)    Obesity    Syphilis    Family History  Problem Relation Age of Onset   Hypertension Mother    Alzheimer's disease Maternal Aunt    Heart disease Maternal Grandfather    Diabetes Paternal Grandmother    Scheduled Meds: Continuous Infusions: PRN Meds:  Allergies  Allergen Reactions   Bactrim [Sulfamethoxazole-Trimethoprim] Anaphylaxis   Latex     itching   Social History   Socioeconomic History   Marital status: Single    Spouse name: Not on file   Number of children: Not on file   Years of education: Not on file   Highest education level: Not on file  Occupational History   Not on file   Tobacco Use   Smoking status: Every Day    Packs/day: 0.25    Types: Cigarettes   Smokeless tobacco: Never  Vaping Use   Vaping Use: Never used  Substance and Sexual Activity   Alcohol use: Yes    Alcohol/week: 1.0 standard drink    Types: 1 Glasses of wine per week    Comment: sometimes     Drug use: No   Sexual activity: Not Currently    Birth control/protection: None  Other Topics Concern   Not on file  Social History Narrative   Not on file   Social Determinants of Health   Financial Resource Strain: Not on file  Food Insecurity: Not on file  Transportation Needs: Not on file  Physical Activity: Not on file  Stress: Not on file  Social Connections: Not on file  Intimate Partner Violence: Not on file   Review of Systems Comprehensive ROS Pertinent positive and negative noted in HPI   Physical exam: General: Vital signs reviewed.  Patient is well-developed and well-nourished, severe morbid obese in no acute distress and cooperative with exam. Head: Normocephalic and atraumatic. Neck: Supple, trachea midline, normal ROM, no JVD, masses, thyromegaly, or carotid bruit present. Cardiovascular: RRR, S1 normal, S2 normal, no murmurs, gallops, or rubs. Pulmonary/Chest: Clear to auscultation bilaterally, no wheezes, rales, or rhonchi. Abdominal: Soft, non-tender, non-distended, BS +, no masses, organomegaly, or guarding present. Musculoskeletal: No  joint deformities, erythema, or stiffness, ROM full and nontender. Extremities: No lower extremity edema bilaterally,  pulses symmetric and intact bilaterally. No cyanosis or clubbing. Neurological: A&O x3, Strength is normal Skin: Warm, dry and intact. No rashes or erythema. Psychiatric: Normal mood and affect. speech and behavior is normal. Cognition and memory are normal.  Laura Gutierrez was seen today for std testing and medication refill.  Diagnoses and all orders for this visit:  Prediabetes -     HgB A1c 6.1 Continue to  monitor foods that are high in carbohydrates are the following rice, potatoes, breads, sugars, and pastas.  Reduction in the intake (eating) will assist in lowering your blood sugars.   Hypertension, unspecified type -     amLODipine (NORVASC) 10 MG tablet; Take 1 tablet (10 mg total) by mouth daily. -     carvedilol (COREG) 12.5 MG tablet; Take 1 tablet (12.5 mg total) by mouth 2 (two) times daily with a meal. -     losartan-hydrochlorothiazide (HYZAAR) 100-25 MG tablet; Take 1 tablet by mouth daily.  Screen for STD (sexually transmitted disease) -     Cervicovaginal ancillary only -     HIV antibody (with reflex)  Medication refill -     amLODipine (NORVASC) 10 MG tablet; Take 1 tablet (10 mg total) by mouth daily. -     carvedilol (COREG) 12.5 MG tablet; Take 1 tablet (12.5 mg total) by mouth 2 (two) times daily with a meal. -     losartan-hydrochlorothiazide (HYZAAR) 100-25 MG tablet; Take 1 tablet by mouth daily.    This note has been created with Education officer, environmental. Any transcriptional errors are unintentional.   Grayce Sessions, NP 03/04/2022, 3:29 PM

## 2022-03-08 LAB — CERVICOVAGINAL ANCILLARY ONLY
Bacterial Vaginitis (gardnerella): POSITIVE — AB
Candida Glabrata: NEGATIVE
Candida Vaginitis: NEGATIVE
Chlamydia: NEGATIVE
Comment: NEGATIVE
Comment: NEGATIVE
Comment: NEGATIVE
Comment: NEGATIVE
Comment: NEGATIVE
Comment: NORMAL
Neisseria Gonorrhea: NEGATIVE
Trichomonas: NEGATIVE

## 2022-03-09 ENCOUNTER — Other Ambulatory Visit (INDEPENDENT_AMBULATORY_CARE_PROVIDER_SITE_OTHER): Payer: Self-pay | Admitting: Primary Care

## 2022-03-09 DIAGNOSIS — N760 Acute vaginitis: Secondary | ICD-10-CM

## 2022-03-09 MED ORDER — METRONIDAZOLE 500 MG PO TABS: 500.0000 mg | ORAL_TABLET | Freq: Two times a day (BID) | ORAL | 0 refills | Status: DC

## 2022-04-01 ENCOUNTER — Ambulatory Visit (INDEPENDENT_AMBULATORY_CARE_PROVIDER_SITE_OTHER): Payer: Medicaid Other | Admitting: Primary Care

## 2022-04-01 ENCOUNTER — Encounter (INDEPENDENT_AMBULATORY_CARE_PROVIDER_SITE_OTHER): Payer: Self-pay | Admitting: Primary Care

## 2022-04-01 VITALS — BP 121/87 | HR 87 | Temp 98.0°F | Ht 66.0 in | Wt 361.2 lb

## 2022-04-01 DIAGNOSIS — I10 Essential (primary) hypertension: Secondary | ICD-10-CM

## 2022-04-01 NOTE — Progress Notes (Signed)
[facility]   Laura Gutierrez is a 36 y.o. female presents for hypertension evaluation, Denies shortness of breath, headaches, chest pain or lower extremity edema, sudden onset, vision changes, unilateral weakness, dizziness, paresthesias   Patient reports adherence with medications.  Dietary habits include: monitoring sodium  Exercise habits include:walking  Family / Social history: Mother- DM   Past Medical History:  Diagnosis Date   Bronchitis    Gestational diabetes    Gonorrhea    Hypertension    Morbid obesity (HCC)    Obesity    Syphilis    Past Surgical History:  Procedure Laterality Date   PERINEAL LACERATION REPAIR N/A 07/13/2020   Procedure: SUTURE REPAIR PERINEAL LACERATION;  Surgeon: Tereso Newcomer, MD;  Location: [facility] LD ORS;  Service: Gynecology;  Laterality: N/A;   TONSILLECTOMY     Allergies  Allergen Reactions   Bactrim [Sulfamethoxazole-Trimethoprim] Anaphylaxis   Latex     itching   Current Outpatient Medications on File Prior to Visit  Medication Sig Dispense Refill   amLODipine (NORVASC) 10 MG tablet Take 1 tablet (10 mg total) by mouth daily. 90 tablet 3   carvedilol (COREG) 12.5 MG tablet Take 1 tablet (12.5 mg total) by mouth 2 (two) times daily with a meal. 60 tablet 3   losartan-hydrochlorothiazide (HYZAAR) 100-25 MG tablet Take 1 tablet by mouth daily. 90 tablet 3   fluticasone (FLONASE) 50 MCG/ACT nasal spray Place 2 sprays into both nostrils daily. (Patient not taking: Reported on 03/04/2022) 16 g 6   No current facility-administered medications on file prior to visit.   Social History   Socioeconomic History   Marital status: Single    Spouse name: Not on file   Number of children: Not on file   Years of education: Not on file   Highest education level: Not on file  Occupational History   Not on file  Tobacco Use   Smoking status: Every Day    Packs/day: 0.25    Types: Cigarettes   Smokeless tobacco: Never  Vaping  Use   Vaping Use: Never used  Substance and Sexual Activity   Alcohol use: Yes    Alcohol/week: 1.0 standard drink of alcohol    Types: 1 Glasses of wine per week    Comment: sometimes     Drug use: No   Sexual activity: Not Currently    Birth control/protection: None  Other Topics Concern   Not on file  Social History Narrative   Not on file   Social Determinants of Health   Financial Resource Strain: Not on file  Food Insecurity: Not on file  Transportation Needs: Not on file  Physical Activity: Not on file  Stress: Not on file  Social Connections: Not on file  Intimate Partner Violence: Not on file   Family History  Problem Relation Age of Onset   Hypertension Mother    Alzheimer's disease Maternal Aunt    Heart disease Maternal Grandfather    Diabetes Paternal Grandmother      OBJECTIVE:  Vitals:   04/01/22 1040  BP: 121/87  Pulse: 87  Temp: 98 F (36.7 C)  TempSrc: Oral  SpO2: 97%  Weight: (!) 361 lb 3.2 oz (163.8 kg)  Height: 5\' 6"  (1.676 m)    Physical Exam Constitutional:      Appearance: She is obese.  HENT:     Head: Normocephalic.     Right Ear: External ear normal.     Left Ear: External ear normal.  Nose: Nose normal.  Eyes:     Extraocular Movements: Extraocular movements intact.  Cardiovascular:     Rate and Rhythm: Normal rate and regular rhythm.  Pulmonary:     Effort: Pulmonary effort is normal.     Breath sounds: Normal breath sounds.  Abdominal:     General: Bowel sounds are normal. There is distension.     Palpations: Abdomen is soft.  Musculoskeletal:        General: Normal range of motion.     Cervical back: Normal range of motion and neck supple.  Skin:    General: Skin is warm and dry.  Neurological:     Mental Status: She is alert and oriented to person, place, and time.  Psychiatric:        Mood and Affect: Mood normal.        Behavior: Behavior normal.        Thought Content: Thought content normal.     ROS  Last 3 Office BP readings: BP Readings from Last 3 Encounters:  04/01/22 121/87  03/04/22 (!) 155/105  10/29/21 (!) 141/91    BMET    Component Value Date/Time   NA 138 10/25/2021 0225   NA 137 05/29/2020 1546   K 3.9 10/25/2021 0225   CL 105 10/25/2021 0225   CO2 26 10/25/2021 0225   GLUCOSE 106 (H) 10/25/2021 0225   BUN 18 10/25/2021 0225   BUN 8 05/29/2020 1546   CREATININE 0.79 10/25/2021 0225   CALCIUM 8.7 (L) 10/25/2021 0225   GFRNONAA >60 10/25/2021 0225   GFRAA >60 06/19/2020 0447    Renal function: CrCl cannot be calculated (Patient's most recent lab result is older than the maximum 21 days allowed.).  Clinical ASCVD: No  The ASCVD Risk score (Arnett DK, et al., 2019) failed to calculate for the following reasons:   The 2019 ASCVD risk score is only valid for ages 85 to 59  ASCVD risk factors include- [place]   ASSESSMENT & PLAN:  . Laura Gutierrez was seen today for blood pressure check.  Diagnoses and all orders for this visit:  Essential hypertension -Counseled on lifestyle modifications for blood pressure control including reduced dietary sodium, increased exercise, weight reduction and adequate sleep. Also, educated patient about the risk for cardiovascular events, stroke and heart attack. Also counseled patient about the importance of medication adherence. If you participate in smoking, it is important to stop using tobacco as this will increase the risks associated with uncontrolled blood pressure.   Goal BP:  For patients younger than 60: Goal BP < 130/80. For patients 60 and older: Goal BP < 140/90. For patients with diabetes: Goal BP < 130/80. Your most recent BP: 121/87   Minimize salt intake. Minimize alcohol intake    This note has been created with Education officer, environmental. Any transcriptional errors are unintentional.   Grayce Sessions, NP 04/01/2022, 10:48 AM

## 2022-04-01 NOTE — Patient Instructions (Signed)
Calorie Counting for Weight Loss Calories are units of energy. Your body needs a certain number of calories from food to keep going throughout the day. When you eat or drink more calories than your body needs, your body stores the extra calories mostly as fat. When you eat or drink fewer calories than your body needs, your body burns fat to get the energy it needs. Calorie counting means keeping track of how many calories you eat and drink each day. Calorie counting can be helpful if you need to lose weight. If you eat fewer calories than your body needs, you should lose weight. Ask your health care provider what a healthy weight is for you. For calorie counting to work, you will need to eat the right number of calories each day to lose a healthy amount of weight per week. A dietitian can help you figure out how many calories you need in a day and will suggest ways to reach your calorie goal. A healthy amount of weight to lose each week is usually 1-2 lb (0.5-0.9 kg). This usually means that your daily calorie intake should be reduced by 500-750 calories. Eating 1,200-1,500 calories a day can help most women lose weight. Eating 1,500-1,800 calories a day can help most men lose weight. What do I need to know about calorie counting? Work with your health care provider or dietitian to determine how many calories you should get each day. To meet your daily calorie goal, you will need to: Find out how many calories are in each food that you would like to eat. Try to do this before you eat. Decide how much of the food you plan to eat. Keep a food log. Do this by writing down what you ate and how many calories it had. To successfully lose weight, it is important to balance calorie counting with a healthy lifestyle that includes regular activity. Where do I find calorie information?  The number of calories in a food can be found on a Nutrition Facts label. If a food does not have a Nutrition Facts label, try  to look up the calories online or ask your dietitian for help. Remember that calories are listed per serving. If you choose to have more than one serving of a food, you will have to multiply the calories per serving by the number of servings you plan to eat. For example, the label on a package of bread might say that a serving size is 1 slice and that there are 90 calories in a serving. If you eat 1 slice, you will have eaten 90 calories. If you eat 2 slices, you will have eaten 180 calories. How do I keep a food log? After each time that you eat, record the following in your food log as soon as possible: What you ate. Be sure to include toppings, sauces, and other extras on the food. How much you ate. This can be measured in cups, ounces, or number of items. How many calories were in each food and drink. The total number of calories in the food you ate. Keep your food log near you, such as in a pocket-sized notebook or on an app or website on your mobile phone. Some programs will calculate calories for you and show you how many calories you have left to meet your daily goal. What are some portion-control tips? Know how many calories are in a serving. This will help you know how many servings you can have of a certain   food. Use a measuring cup to measure serving sizes. You could also try weighing out portions on a kitchen scale. With time, you will be able to estimate serving sizes for some foods. Take time to put servings of different foods on your favorite plates or in your favorite bowls and cups so you know what a serving looks like. Try not to eat straight from a food's packaging, such as from a bag or box. Eating straight from the package makes it hard to see how much you are eating and can lead to overeating. Put the amount you would like to eat in a cup or on a plate to make sure you are eating the right portion. Use smaller plates, glasses, and bowls for smaller portions and to prevent  overeating. Try not to multitask. For example, avoid watching TV or using your computer while eating. If it is time to eat, sit down at a table and enjoy your food. This will help you recognize when you are full. It will also help you be more mindful of what and how much you are eating. What are tips for following this plan? Reading food labels Check the calorie count compared with the serving size. The serving size may be smaller than what you are used to eating. Check the source of the calories. Try to choose foods that are high in protein, fiber, and vitamins, and low in saturated fat, trans fat, and sodium. Shopping Read nutrition labels while you shop. This will help you make healthy decisions about which foods to buy. Pay attention to nutrition labels for low-fat or fat-free foods. These foods sometimes have the same number of calories or more calories than the full-fat versions. They also often have added sugar, starch, or salt to make up for flavor that was removed with the fat. Make a grocery list of lower-calorie foods and stick to it. Cooking Try to cook your favorite foods in a healthier way. For example, try baking instead of frying. Use low-fat dairy products. Meal planning Use more fruits and vegetables. One-half of your plate should be fruits and vegetables. Include lean proteins, such as chicken, turkey, and fish. Lifestyle Each week, aim to do one of the following: 150 minutes of moderate exercise, such as walking. 75 minutes of vigorous exercise, such as running. General information Know how many calories are in the foods you eat most often. This will help you calculate calorie counts faster. Find a way of tracking calories that works for you. Get creative. Try different apps or programs if writing down calories does not work for you. What foods should I eat?  Eat nutritious foods. It is better to have a nutritious, high-calorie food, such as an avocado, than a food with  few nutrients, such as a bag of potato chips. Use your calories on foods and drinks that will fill you up and will not leave you hungry soon after eating. Examples of foods that fill you up are nuts and nut butters, vegetables, lean proteins, and high-fiber foods such as whole grains. High-fiber foods are foods with more than 5 g of fiber per serving. Pay attention to calories in drinks. Low-calorie drinks include water and unsweetened drinks. The items listed above may not be a complete list of foods and beverages you can eat. Contact a dietitian for more information. What foods should I limit? Limit foods or drinks that are not good sources of vitamins, minerals, or protein or that are high in unhealthy fats. These   include: Candy. Other sweets. Sodas, specialty coffee drinks, alcohol, and juice. The items listed above may not be a complete list of foods and beverages you should avoid. Contact a dietitian for more information. How do I count calories when eating out? Pay attention to portions. Often, portions are much larger when eating out. Try these tips to keep portions smaller: Consider sharing a meal instead of getting your own. If you get your own meal, eat only half of it. Before you start eating, ask for a container and put half of your meal into it. When available, consider ordering smaller portions from the menu instead of full portions. Pay attention to your food and drink choices. Knowing the way food is cooked and what is included with the meal can help you eat fewer calories. If calories are listed on the menu, choose the lower-calorie options. Choose dishes that include vegetables, fruits, whole grains, low-fat dairy products, and lean proteins. Choose items that are boiled, broiled, grilled, or steamed. Avoid items that are buttered, battered, fried, or served with cream sauce. Items labeled as crispy are usually fried, unless stated otherwise. Choose water, low-fat milk,  unsweetened iced tea, or other drinks without added sugar. If you want an alcoholic beverage, choose a lower-calorie option, such as a glass of wine or light beer. Ask for dressings, sauces, and syrups on the side. These are usually high in calories, so you should limit the amount you eat. If you want a salad, choose a garden salad and ask for grilled meats. Avoid extra toppings such as bacon, cheese, or fried items. Ask for the dressing on the side, or ask for olive oil and vinegar or lemon to use as dressing. Estimate how many servings of a food you are given. Knowing serving sizes will help you be aware of how much food you are eating at restaurants. Where to find more information Centers for Disease Control and Prevention: www.cdc.gov [place] Department of Agriculture: myplate.gov Summary Calorie counting means keeping track of how many calories you eat and drink each day. If you eat fewer calories than your body needs, you should lose weight. A healthy amount of weight to lose per week is usually 1-2 lb (0.5-0.9 kg). This usually means reducing your daily calorie intake by 500-750 calories. The number of calories in a food can be found on a Nutrition Facts label. If a food does not have a Nutrition Facts label, try to look up the calories online or ask your dietitian for help. Use smaller plates, glasses, and bowls for smaller portions and to prevent overeating. Use your calories on foods and drinks that will fill you up and not leave you hungry shortly after a meal. This information is not intended to replace advice given to you by your health care provider. Make sure you discuss any questions you have with your health care provider. Document Revised: 11/01/2019 Document Reviewed: 11/01/2019 Elsevier Patient Education  2023 [employer]  

## 2022-05-21 ENCOUNTER — Encounter (INDEPENDENT_AMBULATORY_CARE_PROVIDER_SITE_OTHER): Payer: Self-pay | Admitting: Primary Care

## 2022-06-15 ENCOUNTER — Ambulatory Visit (INDEPENDENT_AMBULATORY_CARE_PROVIDER_SITE_OTHER): Payer: Medicaid Other | Admitting: Primary Care

## 2022-06-15 ENCOUNTER — Other Ambulatory Visit (HOSPITAL_COMMUNITY)
Admission: RE | Admit: 2022-06-15 | Discharge: 2022-06-15 | Disposition: A | Discharge status: Home / Self Care | Payer: Medicaid Other | Source: Ambulatory Visit | Attending: Primary Care | Admitting: Primary Care

## 2022-06-15 DIAGNOSIS — N898 Other specified noninflammatory disorders of vagina: Secondary | ICD-10-CM | POA: Insufficient documentation

## 2022-06-17 ENCOUNTER — Other Ambulatory Visit (INDEPENDENT_AMBULATORY_CARE_PROVIDER_SITE_OTHER): Payer: Self-pay | Admitting: Primary Care

## 2022-06-17 ENCOUNTER — Ambulatory Visit (INDEPENDENT_AMBULATORY_CARE_PROVIDER_SITE_OTHER): Payer: Self-pay

## 2022-06-17 DIAGNOSIS — B9689 Other specified bacterial agents as the cause of diseases classified elsewhere: Secondary | ICD-10-CM

## 2022-06-17 LAB — CERVICOVAGINAL ANCILLARY ONLY
Bacterial Vaginitis (gardnerella): POSITIVE — AB
Candida Glabrata: NEGATIVE
Candida Vaginitis: NEGATIVE
Chlamydia: NEGATIVE
Comment: NEGATIVE
Comment: NEGATIVE
Comment: NEGATIVE
Comment: NEGATIVE
Comment: NEGATIVE
Comment: NORMAL
Neisseria Gonorrhea: NEGATIVE
Trichomonas: NEGATIVE

## 2022-06-17 MED ORDER — METRONIDAZOLE 500 MG PO TABS: 500.0000 mg | ORAL_TABLET | Freq: Two times a day (BID) | ORAL | 0 refills | Status: DC

## 2022-06-17 NOTE — Progress Notes (Signed)
[facility]     [facility]   Subjective:   Laura Gutierrez is a 36 y.o. G57P1001 female complains of an abnormal vaginal discharge for 2 weeks. Discharge described as: white and thick. Vaginal symptoms include local irritation.Vulvar symptoms include local irritation.STI Risk: Very low risk of STD exposure.   Other associated symptoms: none.Menstrual pattern: She had been bleeding No LMP recorded.. Contraception: none.  She denies vomiting, abdominal pain, fussiness, diarrhea, cough, and difficulty breathing recent antibiotic exposure, denies no changes in soaps, detergents coinciding with the onset of her symptoms.  She has not previously self treated or been under treatment by another provider for these symptoms.     Gynecologic History No LMP recorded. Contraception: none   Obstetric History OB History  Gravida Para Term Preterm AB Living  1 1 1     1   SAB IAB Ectopic Multiple Live Births        0 1    # Outcome Date GA Lbr Len/2nd Weight Sex Delivery Anes PTL Lv  1 Term 07/13/20 [redacted]w[redacted]d 07:21 / 00:42 6 lb 6.3 oz (2.9 kg) M Vag-Spont EPI  LIV    Past Medical History:  Diagnosis Date   Bronchitis    Gestational diabetes    Gonorrhea    Hypertension    Morbid obesity (HCC)    Obesity    Syphilis     Past Surgical History:  Procedure Laterality Date   PERINEAL LACERATION REPAIR N/A 07/13/2020   Procedure: SUTURE REPAIR PERINEAL LACERATION;  Surgeon: 09/12/2020, MD;  Location: [facility] LD ORS;  Service: Gynecology;  Laterality: N/A;   TONSILLECTOMY      Current Outpatient Medications on File Prior to Visit  Medication Sig Dispense Refill   amLODipine (NORVASC) 10 MG tablet Take 1 tablet (10 mg total) by mouth daily. 90 tablet 3   carvedilol (COREG) 12.5 MG tablet Take 1 tablet (12.5 mg total) by mouth 2 (two) times daily with a meal. 60 tablet 3   fluticasone (FLONASE) 50 MCG/ACT nasal spray Place 2 sprays into both nostrils daily.  (Patient not taking: Reported on 03/04/2022) 16 g 6   losartan-hydrochlorothiazide (HYZAAR) 100-25 MG tablet Take 1 tablet by mouth daily. 90 tablet 3   No current facility-administered medications on file prior to visit.    Allergies  Allergen Reactions   Bactrim [Sulfamethoxazole-Trimethoprim] Anaphylaxis   Latex     itching    Social History   Socioeconomic History   Marital status: Single    Spouse name: Not on file   Number of children: Not on file   Years of education: Not on file   Highest education level: Not on file  Occupational History   Not on file  Tobacco Use   Smoking status: Every Day    Packs/day: 0.25    Types: Cigarettes   Smokeless tobacco: Never  Vaping Use   Vaping Use: Never used  Substance and Sexual Activity   Alcohol use: Yes    Alcohol/week: 1.0 standard drink of alcohol    Types: 1 Glasses of wine per week    Comment: sometimes     Drug use: No   Sexual activity: Not Currently    Birth control/protection: None  Other Topics Concern   Not on file  Social History Narrative   Not on file   Social Determinants of Health   Financial Resource Strain: Not on file  Food Insecurity: Not on file  Transportation Needs: Not  on file  Physical Activity: Not on file  Stress: Not on file  Social Connections: Not on file  Intimate Partner Violence: Not on file    Family History  Problem Relation Age of Onset   Hypertension Mother    Alzheimer's disease Maternal Aunt    Heart disease Maternal Grandfather    Diabetes Paternal Grandmother     The following portions of the patient's history were reviewed and updated as appropriate: allergies, current medications, past family history, past medical history, past social history, past surgical history and problem list.  Review of Systems Pertinent items are noted in HPI.   Objective:  There were no vitals taken for this visit.  CONSTITUTIONAL: Well-developed, well-nourished female in no acute  distress.  HENT:  Normocephalic, atraumatic, External right and left ear normal. Oropharynx is clear and moist EYES: Conjunctivae and EOM are normal. Pupils are equal, round, and reactive to light. No scleral icterus.  NECK: Normal range of motion, supple, no masses.  Normal thyroid.  SKIN: Skin is warm and dry. No rash noted. Not diaphoretic. No erythema. No pallor. NEUROLGIC: Alert and oriented to person, place, and time. Normal reflexes, muscle tone coordination. No cranial nerve deficit noted. PSYCHIATRIC: Normal mood and affect. Normal behavior. Normal judgment and thought content. CARDIOVASCULAR: Normal heart rate noted, regular rhythm RESPIRATORY: Clear to auscultation bilaterally. Effort and breath sounds normal, no problems with respiration noted. BREASTS: Symmetric in size. No masses, skin changes, nipple drainage, or lymphadenopathy. Taught self breast exam and had patient to demonstrate SBE. ABDOMEN: Soft, normal bowel sounds, no distention noted.  No tenderness, rebound or guarding.  PELVIC: Normal appearing external genitalia; normal appearing vaginal mucosa and cervix.  No abnormal discharge noted.  Pap smear obtained.  Normal uterine size, no other palpable masses, no uterine or adnexal tenderness. MUSCULOSKELETAL: Normal range of motion. No tenderness.  No cyanosis, clubbing, or edema.  2+ distal pulses.   Assessment:   Diagnoses and all orders for this visit:  Vaginal discharge -     Cervicovaginal ancillary only  If tests results are positive, please abstain from sexual activity until you and your partner(s) have been treated Return precautions given to come here or go to ER if you have any new or worsening symptoms fever, chills, nausea, vomiting, abdominal or pelvic pain, painful intercourse, vaginal discharge, vaginal bleeding, persistent symptoms despite treatment, etc...  Reviewed expectations re: course of current medical issues. Questions answered. Outlined signs  and symptoms indicating need for more acute intervention. Patient verbalized understanding.    Plan:   Will follow up on all labs ordered today.  Patient counseled regarding condom use with each sexual activity to promote wellness and prevention of transmission of HIV, syphilis, herpes simplex virus, gonorrhea, chlamydia and trichomoniasis.   This note has been created with Education officer, environmental. Any transcriptional errors are unintentional.   Grayce Sessions, NP 06/17/2022, 11:54 PM

## 2022-06-17 NOTE — Telephone Encounter (Signed)
Will forward to provider  

## 2022-06-17 NOTE — Telephone Encounter (Signed)
Summary: discuss medication   Pt states she received he lab results and inquiring if provider has sent rx to pharmacy   Pt does not know the name of the medication   Please assist further

## 2022-06-17 NOTE — Telephone Encounter (Signed)
Chief Complaint: Lab results show positive for BV and would like to be presribed a medication to start taking  Symptoms: N/A Frequency: N/A Pertinent Negatives: Patient denies N/A Disposition: [] ED /[] Urgent Care (no appt availability in office) / [] Appointment(In office/virtual)/ []  [facility]/ [] Home Care/ [] Refused Recommended Disposition /[] [facility]/ [x]  Follow-up with PCP Additional Notes: Advised patient I will send this to provider, she has not reviewed the lab results, once reviewed someone will call with those and her recommendation.    Summary: discuss medication   Pt states she received he lab results and inquiring if provider has sent rx to pharmacy   Pt does not know the name of the medication   Please assist further        Reason for Disposition  Prescription request for new medicine (not a refill)  Answer Assessment - Initial Assessment Questions 1. NAME of MEDICINE: "What medicine(s) are you calling about?"     Would like to be Prescribed a medication for bacterial vaginosis per my labs  Protocols used: Medication Question Call-A-AH

## 2022-10-01 ENCOUNTER — Ambulatory Visit (INDEPENDENT_AMBULATORY_CARE_PROVIDER_SITE_OTHER): Payer: Medicaid Other | Admitting: Primary Care

## 2023-01-19 ENCOUNTER — Encounter (INDEPENDENT_AMBULATORY_CARE_PROVIDER_SITE_OTHER): Payer: Self-pay | Admitting: Primary Care

## 2023-01-19 ENCOUNTER — Other Ambulatory Visit (HOSPITAL_COMMUNITY)
Admission: RE | Admit: 2023-01-19 | Discharge: 2023-01-19 | Disposition: A | Discharge status: Home / Self Care | Payer: 59 | Source: Ambulatory Visit | Attending: Primary Care | Admitting: Primary Care

## 2023-01-19 ENCOUNTER — Ambulatory Visit (INDEPENDENT_AMBULATORY_CARE_PROVIDER_SITE_OTHER): Payer: 59 | Admitting: Primary Care

## 2023-01-19 VITALS — BP 137/94 | HR 89 | Resp 16 | Wt 350.0 lb

## 2023-01-19 DIAGNOSIS — I10 Essential (primary) hypertension: Secondary | ICD-10-CM

## 2023-01-19 DIAGNOSIS — Z124 Encounter for screening for malignant neoplasm of cervix: Secondary | ICD-10-CM

## 2023-01-21 LAB — CERVICOVAGINAL ANCILLARY ONLY
Bacterial Vaginitis (gardnerella): POSITIVE — AB
Candida Glabrata: NEGATIVE
Candida Vaginitis: NEGATIVE
Chlamydia: NEGATIVE
Comment: NEGATIVE
Comment: NEGATIVE
Comment: NEGATIVE
Comment: NEGATIVE
Comment: NEGATIVE
Comment: NORMAL
Neisseria Gonorrhea: NEGATIVE
Trichomonas: NEGATIVE

## 2023-01-23 ENCOUNTER — Other Ambulatory Visit (INDEPENDENT_AMBULATORY_CARE_PROVIDER_SITE_OTHER): Payer: Self-pay | Admitting: Primary Care

## 2023-01-23 DIAGNOSIS — B9689 Other specified bacterial agents as the cause of diseases classified elsewhere: Secondary | ICD-10-CM

## 2023-01-23 MED ORDER — METRONIDAZOLE 500 MG PO TABS: 500.0000 mg | ORAL_TABLET | Freq: Two times a day (BID) | ORAL | 0 refills | Status: DC

## 2023-01-23 NOTE — Progress Notes (Signed)
  [facility]  WELL-WOMAN PHYSICAL & PAP Patient name: Laura Gutierrez MRN [id number]  Date of birth: 1986-03-18 Chief Complaint:   Gynecologic Exam and Back Pain  History of Present Illness:   Laura Gutierrez is a 37 y.o. G3P1001 female being seen today for a routine well-woman exam.     The current method of family planning is none.  Patient's last menstrual period was 01/10/2023. Last pap 01/08/2020.   Review of Systems:    Denies any headaches, blurred vision, fatigue, shortness of breath, chest pain, abdominal pain, abnormal vaginal discharge/itching/odor/irritation, problems with periods, bowel movements, urination, or intercourse unless otherwise stated above.  Pertinent History Reviewed:   Reviewed past medical,surgical, social and family history.  Reviewed problem list, medications and allergies.  Physical Assessment:   Vitals:   01/19/23 1547  BP: (Abnormal) 137/94  Pulse: 89  Resp: 16  SpO2: 100%  Weight: (Abnormal) 350 lb (158.8 kg)  Body mass index is 56.49 kg/m.        Physical Examination:  General appearance - well appearing, and in no distress Mental status - alert, oriented to person, place, and time Psych:  She has a normal mood and affect Skin - warm and dry, normal color, no suspicious lesions noted Chest - effort normal, all lung fields clear to auscultation bilaterally Heart - normal rate and regular rhythm Neck:  midline trachea, no thyromegaly or nodules Breasts - breasts appear normal, no suspicious masses, no skin or nipple changes or axillary nodes Educated patient on proper self breast examination and had patient to demonstrate SBE. Abdomen - soft, nontender, nondistended, no masses or organomegaly Pelvic-VULVA: normal appearing vulva with no masses, tenderness or lesions   VAGINA: normal appearing vagina with normal color and discharge, no lesions   CERVIX: normal appearing cervix without discharge or lesions, no CMT UTERUS:  uterus is felt to be normal size, shape, consistency and nontender  ADNEXA: No adnexal masses or tenderness noted. Extremities:  No swelling or varicosities noted  No results found for this or any previous visit (from the past 24 hour(s)).   Assessment & Plan:  Laura Gutierrez was seen today for gynecologic exam and back pain.  Diagnoses and all orders for this visit:  Cervical cancer screening -     Cytology - PAP -     Cervicovaginal ancillary only   Essential hypertension  BP goal - < 130/80 Explained that having normal blood pressure is the goal and medications are helping to get to goal and maintain normal blood pressure. DIET: Limit salt intake, read nutrition labels to check salt content, limit fried and high fatty foods  Avoid using multisymptom OTC cold preparations that generally contain sudafed which can rise BP. Consult with pharmacist on best cold relief products to use for persons with HTN EXERCISE Discussed incorporating exercise such as walking - 30 minutes most days of the week and can do in 10 minute intervals     This note has been created with Education officer, environmental. Any transcriptional errors are unintentional.   Grayce Sessions, NP 01/23/2023, 10:44 PM

## 2023-01-24 LAB — CYTOLOGY - PAP
Comment: NEGATIVE
Diagnosis: NEGATIVE
High risk HPV: NEGATIVE

## 2023-01-26 ENCOUNTER — Encounter (INDEPENDENT_AMBULATORY_CARE_PROVIDER_SITE_OTHER): Payer: Self-pay | Admitting: Primary Care

## 2023-01-26 DIAGNOSIS — R053 Chronic cough: Secondary | ICD-10-CM

## 2023-02-22 ENCOUNTER — Encounter: Payer: Self-pay | Admitting: Pulmonary Disease

## 2023-02-22 ENCOUNTER — Institutional Professional Consult (permissible substitution): Payer: 59 | Admitting: Pulmonary Disease

## 2023-02-22 ENCOUNTER — Ambulatory Visit (INDEPENDENT_AMBULATORY_CARE_PROVIDER_SITE_OTHER): Payer: 59 | Admitting: Pulmonary Disease

## 2023-02-22 VITALS — BP 136/94 | HR 91 | Ht 66.0 in | Wt 345.0 lb

## 2023-02-22 DIAGNOSIS — R053 Chronic cough: Secondary | ICD-10-CM

## 2023-02-22 DIAGNOSIS — F17210 Nicotine dependence, cigarettes, uncomplicated: Secondary | ICD-10-CM

## 2023-02-22 DIAGNOSIS — K219 Gastro-esophageal reflux disease without esophagitis: Secondary | ICD-10-CM

## 2023-02-22 MED ORDER — PANTOPRAZOLE SODIUM 40 MG PO TBEC: 40.0000 mg | DELAYED_RELEASE_TABLET | Freq: Two times a day (BID) | ORAL | 2 refills | Status: DC

## 2023-02-22 NOTE — Patient Instructions (Addendum)
Nice to meet you  You have a risk factor for multiple reasons for cough  Due to plan for tach that is one of the time  My biggest concern is for reflux or heartburn - to treat this use pantoprazole 1 tablet in the morning before breakfast and in the evening before dinner  If no better, we will do an inhaler in a couple of months, use the acid medicine for 8 weeks  I ordered a special swallow study at the hospital to evaluate for reflux as well as this issue with swallowing and coughing  Return to clinic in 3 months or sooner if needed with Dr. Judeth Horn

## 2023-02-28 NOTE — Progress Notes (Signed)
@Patient  ID: Laura Gutierrez, female    DOB: 09/18/1986, 37 y.o.   MRN: [id number]  Chief Complaint  Patient presents with   Consult    Pt states she has had a cough for about 3 years which can happen at anytime. Cough is worse after eating.    Referring provider: Grayce Sessions, NP  HPI:   37 y.o. woman whom we are seeing for evaluation of chronic cough.  Most recent PCP note x 2 reviewed.  Patient notes cough for 3 years.  Essentially every day.  Usually dry.  Worse after eating and drinking.  Endorses reflux symptoms quite frequently.  Endorses coughing when eating or drinking a couple times a week at least.  Occasionally has postnasal drip symptoms.  Denies any asthma symptoms in the past.  Symptoms sometimes worse in the evenings as well.  No alleviating or exacerbating factors she can identify.  She has been told chest images are clear.  Most recent chest imaging in 2022 I can review reveals clear lungs without evidence of hyperinflation on my interpretation.    Questionaires / Pulmonary Flowsheets:   ACT:      No data to display          MMRC:     No data to display          Epworth:      No data to display          Tests:   FENO:  No results found for: "NITRICOXIDE"  PFT:     No data to display          WALK:      No data to display          Imaging: Personally reviewed and as per EMR and discussion in this note No results found.  Lab Results: Personally reviewed CBC    Component Value Date/Time   WBC 11.5 (H) 10/25/2021 0225   RBC 4.21 10/25/2021 0225   HGB 8.7 (L) 10/25/2021 0225   HGB 10.2 (L) 05/29/2020 1546   HCT 32.1 (L) 10/25/2021 0225   HCT 32.4 (L) 05/29/2020 1546   PLT 328 10/25/2021 0225   PLT 239 05/29/2020 1546   MCV 76.2 (L) 10/25/2021 0225   MCV 91 05/29/2020 1546   MCH 20.7 (L) 10/25/2021 0225   MCHC 27.1 (L) 10/25/2021 0225   RDW 19.2 (H) 10/25/2021 0225   RDW 14.2 05/29/2020 1546   LYMPHSABS 4.7  (H) 10/25/2021 0225   LYMPHSABS 3.2 (H) 01/08/2020 1405   MONOABS 0.8 10/25/2021 0225   EOSABS 0.1 10/25/2021 0225   EOSABS 0.2 01/08/2020 1405   BASOSABS 0.1 10/25/2021 0225   BASOSABS 0.1 01/08/2020 1405    BMET    Component Value Date/Time   NA 138 10/25/2021 0225   NA 137 05/29/2020 1546   K 3.9 10/25/2021 0225   CL 105 10/25/2021 0225   CO2 26 10/25/2021 0225   GLUCOSE 106 (H) 10/25/2021 0225   BUN 18 10/25/2021 0225   BUN 8 05/29/2020 1546   CREATININE 0.79 10/25/2021 0225   CALCIUM 8.7 (L) 10/25/2021 0225   GFRNONAA >60 10/25/2021 0225   GFRAA >60 06/19/2020 0447    BNP No results found for: "BNP"  ProBNP No results found for: "PROBNP"  Specialty Problems   None   Allergies  Allergen Reactions   Bactrim [Sulfamethoxazole-Trimethoprim] Anaphylaxis   Latex     itching    Immunization History  Administered Date(s) Administered   Tdap  12/16/2013, 05/20/2020    Past Medical History:  Diagnosis Date   Bronchitis    Gestational diabetes    Gonorrhea    Hypertension    Morbid obesity (HCC)    Obesity    Syphilis     Tobacco History: Social History   Tobacco Use  Smoking Status Every Day   Packs/day: 1.00   Years: 17.00   Additional pack years: 0.00   Total pack years: 17.00   Types: Cigarettes   Start date: 2006  Smokeless Tobacco Never  Tobacco Comments   Currently smoking 0.25ppd as of 02/22/23 ep   Ready to quit: Not Answered Counseling given: Not Answered Tobacco comments: Currently smoking 0.25ppd as of 02/22/23 ep   Continue to not smoke  Outpatient Encounter Medications as of 02/22/2023  Medication Sig   amLODipine (NORVASC) 10 MG tablet Take 1 tablet (10 mg total) by mouth daily.   carvedilol (COREG) 12.5 MG tablet Take 1 tablet (12.5 mg total) by mouth 2 (two) times daily with a meal.   losartan-hydrochlorothiazide (HYZAAR) 100-25 MG tablet Take 1 tablet by mouth daily.   pantoprazole (PROTONIX) 40 MG tablet Take 1 tablet (40  mg total) by mouth 2 (two) times daily before a meal.   [DISCONTINUED] fluticasone (FLONASE) 50 MCG/ACT nasal spray Place 2 sprays into both nostrils daily.   [DISCONTINUED] metroNIDAZOLE (FLAGYL) 500 MG tablet Take 1 tablet (500 mg total) by mouth 2 (two) times daily.   No facility-administered encounter medications on file as of 02/22/2023.     Review of Systems  Review of Systems  No chest pain with exertion.  Orthopnea or PND.  Comprehensive review of systems otherwise negative. Physical Exam  BP (!) 136/94 (BP Location: Left Arm, Patient Position: Sitting, Cuff Size: Large)   Pulse 91   Ht 5\' 6"  (1.676 m)   Wt (!) 345 lb (156.5 kg)   SpO2 99% Comment: RA  BMI 55.68 kg/m   Wt Readings from Last 5 Encounters:  02/22/23 (!) 345 lb (156.5 kg)  01/19/23 (!) 350 lb (158.8 kg)  04/01/22 (!) 361 lb 3.2 oz (163.8 kg)  03/04/22 (!) 361 lb 12.8 oz (164.1 kg)  10/29/21 (!) 357 lb (161.9 kg)    BMI Readings from Last 5 Encounters:  02/22/23 55.68 kg/m  01/19/23 56.49 kg/m  04/01/22 58.30 kg/m  03/04/22 58.40 kg/m  10/29/21 57.62 kg/m     Physical Exam General: Sitting in chair, no acute distress Eyes: EOMI, no icterus Neck: Supple, no JVP Abdomen: Nondistended, bowel sounds present Pulmonary: Clear, distant Cardiovascular: Warm no edema MSK: No synovitis, no joint effusion Neuro: Normal gait, no weakness Psych: Normal mood, full affect   Assessment & Plan:   Chronic cough: Prior chest x-ray clear, reassuring.  At risk for multiple etiologies.  Clear description of worsening after eating, reflux symptoms.  Some difficulty swallowing or coughing with eating or drinking.  PPI twice daily.  Esophagram for further evaluation.  If not improving and esophagram is nondiagnostic, plan to move forward with ICS/LABA therapy.  Smoking assessment and cessation counseling  Patient currently smoking: I have advised the patient to quit/stop smoking as soon as possible due to  high risk for multiple medical problems.  It will also be very difficult for [place] to manage patient's  respiratory symptoms and status if we continue to expose her lungs to a known irritant.  We do not advise e-cigarettes as a form of stopping smoking. Patient is willing to quit smoking. I have  advised the patient that we can assist and have options of nicotine replacement therapy, provided smoking cessation education today, provided smoking cessation counseling, and provided cessation resources. Follow-up next office visit office visit for assessment of smoking cessation.  I spent 3 minutes in smoking cessation counseling.    Return in about 3 months (around 05/25/2023).   Karren Burly, MD 02/28/2023   This appointment required 50 minutes of patient care (this includes precharting, chart review, review of results, face-to-face care, etc.).

## 2023-03-02 ENCOUNTER — Ambulatory Visit (HOSPITAL_COMMUNITY): Payer: 59

## 2023-03-08 ENCOUNTER — Ambulatory Visit (HOSPITAL_COMMUNITY)
Admission: RE | Admit: 2023-03-08 | Discharge: 2023-03-08 | Disposition: A | Discharge status: Home / Self Care | Payer: 59 | Source: Ambulatory Visit | Attending: Pulmonary Disease | Admitting: Pulmonary Disease

## 2023-03-08 DIAGNOSIS — K219 Gastro-esophageal reflux disease without esophagitis: Secondary | ICD-10-CM | POA: Diagnosis present

## 2023-03-08 DIAGNOSIS — R053 Chronic cough: Secondary | ICD-10-CM | POA: Diagnosis present

## 2023-05-18 ENCOUNTER — Ambulatory Visit (INDEPENDENT_AMBULATORY_CARE_PROVIDER_SITE_OTHER): Payer: 59

## 2023-05-18 VITALS — BP 160/105 | HR 94 | Ht 66.0 in | Wt 349.9 lb

## 2023-05-18 DIAGNOSIS — Z3201 Encounter for pregnancy test, result positive: Secondary | ICD-10-CM | POA: Diagnosis not present

## 2023-05-18 DIAGNOSIS — N912 Amenorrhea, unspecified: Secondary | ICD-10-CM | POA: Diagnosis not present

## 2023-05-18 DIAGNOSIS — Z3200 Encounter for pregnancy test, result unknown: Secondary | ICD-10-CM

## 2023-05-18 LAB — POCT URINE PREGNANCY: Preg Test, Ur: POSITIVE — AB

## 2023-05-18 MED ORDER — LABETALOL HCL 200 MG PO TABS: 200.0000 mg | ORAL_TABLET | Freq: Three times a day (TID) | ORAL | 0 refills | Status: DC

## 2023-05-18 MED ORDER — PRENATE MINI 18-0.6-0.4-350 MG PO CAPS: 1.0000 | ORAL_CAPSULE | Freq: Every day | ORAL | 11 refills | Status: DC

## 2023-05-18 NOTE — Progress Notes (Signed)
..  Laura Gutierrez presents today for UPT. She states that she was prescribed 3 different BP medications by her PCP before pregnancy but has not taken them in 2 months, denies abnormal symptoms. LMP: 04/08/23    OBJECTIVE: Appears well, in no apparent distress.  OB History     Gravida  2   Para  1   Term  1   Preterm      AB      Living  1      SAB      IAB      Ectopic      Multiple  0   Live Births  1          BP today in office: 171/123, Pulse 96 Retake BP: 160/105, Pulse 94  Home UPT Result: Positive In-Office UPT result:Positive I have reviewed the patient's medical, obstetrical, social, and family histories, and medications.   ASSESSMENT: Positive pregnancy test, hx of CHTN uncontrolled BP today  PLAN Prenatal care to be completed at: [facility] with Dr. Donavan Foil in office and advised pt to start Labetalol 200mg  TID, return in 1 week for BP check. Provided pt with BP cuff today and advised to monitor at home and report abnormal symptoms or be evaluated at [facility].  Provided OTC safe med list, sent PNV and Labetalol to pharmacy

## 2023-05-25 ENCOUNTER — Encounter: Payer: Self-pay | Admitting: Obstetrics and Gynecology

## 2023-05-25 ENCOUNTER — Ambulatory Visit (INDEPENDENT_AMBULATORY_CARE_PROVIDER_SITE_OTHER): Payer: 59 | Admitting: Emergency Medicine

## 2023-05-25 ENCOUNTER — Ambulatory Visit: Payer: 59

## 2023-05-25 VITALS — BP 122/85 | HR 86 | Ht 66.0 in | Wt 343.0 lb

## 2023-05-25 DIAGNOSIS — Z0130 Encounter for examination of blood pressure without abnormal findings: Secondary | ICD-10-CM

## 2023-05-25 DIAGNOSIS — I10 Essential (primary) hypertension: Secondary | ICD-10-CM

## 2023-05-25 NOTE — Progress Notes (Signed)
Pt is [redacted]w[redacted]d here for a BP check. Pt takes Labetalol 200mg  TID.  BP stable in office today 122/85.  Pt denies headache, vision change, swelling and epigastric pain.   Pt advised to continue BP medication as prescribed.

## 2023-06-06 ENCOUNTER — Encounter (HOSPITAL_COMMUNITY): Payer: Self-pay | Admitting: Obstetrics and Gynecology

## 2023-06-06 ENCOUNTER — Inpatient Hospital Stay (HOSPITAL_COMMUNITY): Payer: 59

## 2023-06-06 ENCOUNTER — Inpatient Hospital Stay (HOSPITAL_COMMUNITY)
Admission: AD | Admit: 2023-06-06 | Discharge: 2023-06-06 | Disposition: A | Discharge status: Home / Self Care | Payer: 59 | Attending: Obstetrics and Gynecology | Admitting: Obstetrics and Gynecology

## 2023-06-06 DIAGNOSIS — T448X6A Underdosing of centrally-acting and adrenergic-neuron-blocking agents, initial encounter: Secondary | ICD-10-CM | POA: Diagnosis not present

## 2023-06-06 DIAGNOSIS — Z3A01 Less than 8 weeks gestation of pregnancy: Secondary | ICD-10-CM | POA: Insufficient documentation

## 2023-06-06 DIAGNOSIS — O034 Incomplete spontaneous abortion without complication: Secondary | ICD-10-CM | POA: Diagnosis not present

## 2023-06-06 DIAGNOSIS — I10 Essential (primary) hypertension: Secondary | ICD-10-CM

## 2023-06-06 DIAGNOSIS — O10011 Pre-existing essential hypertension complicating pregnancy, first trimester: Secondary | ICD-10-CM | POA: Insufficient documentation

## 2023-06-06 DIAGNOSIS — Z91128 Patient's intentional underdosing of medication regimen for other reason: Secondary | ICD-10-CM | POA: Insufficient documentation

## 2023-06-06 DIAGNOSIS — M5459 Other low back pain: Secondary | ICD-10-CM | POA: Diagnosis present

## 2023-06-06 DIAGNOSIS — O4691 Antepartum hemorrhage, unspecified, first trimester: Secondary | ICD-10-CM | POA: Diagnosis present

## 2023-06-06 DIAGNOSIS — O0330 Unspecified complication following incomplete spontaneous abortion: Secondary | ICD-10-CM | POA: Diagnosis not present

## 2023-06-06 HISTORY — DX: Urinary tract infection, site not specified: N39.0

## 2023-06-06 LAB — CBC
HCT: 36.9 % (ref 36.0–46.0)
Hemoglobin: 10.9 g/dL — ABNORMAL LOW (ref 12.0–15.0)
MCH: 25.3 pg — ABNORMAL LOW (ref 26.0–34.0)
MCHC: 29.5 g/dL — ABNORMAL LOW (ref 30.0–36.0)
MCV: 85.8 fL (ref 80.0–100.0)
Platelets: 221 10*3/uL (ref 150–400)
RBC: 4.3 MIL/uL (ref 3.87–5.11)
RDW: 19 % — ABNORMAL HIGH (ref 11.5–15.5)
WBC: 9.2 10*3/uL (ref 4.0–10.5)
nRBC: 0 % (ref 0.0–0.2)

## 2023-06-06 LAB — HCG, QUANTITATIVE, PREGNANCY: hCG, Beta Chain, Quant, S: 4347 m[IU]/mL — ABNORMAL HIGH (ref <5)

## 2023-06-06 LAB — WET PREP, GENITAL
Sperm: NONE SEEN
Trich, Wet Prep: NONE SEEN
WBC, Wet Prep HPF POC: <10 (ref <10)
Yeast Wet Prep HPF POC: NONE SEEN

## 2023-06-06 MED ORDER — PROMETHAZINE HCL 25 MG PO TABS: 25.0000 mg | ORAL_TABLET | Freq: Four times a day (QID) | ORAL | 0 refills | Status: DC | PRN

## 2023-06-06 MED ORDER — MISOPROSTOL 200 MCG PO TABS: ORAL_TABLET | ORAL | 1 refills | Status: DC

## 2023-06-06 MED ORDER — TRAMADOL HCL 50 MG PO TABS
50.0000 mg | ORAL_TABLET | Freq: Four times a day (QID) | ORAL | 0 refills | Status: DC | PRN
Start: 2023-06-06 — End: 2023-06-23

## 2023-06-06 MED ORDER — LABETALOL HCL 200 MG PO TABS
400.0000 mg | ORAL_TABLET | Freq: Three times a day (TID) | ORAL | 0 refills | Status: DC
Start: 2023-06-06 — End: 2023-06-29

## 2023-06-06 NOTE — MAU Note (Addendum)
Laura Gutierrez is a 36 y.o. at [redacted]w[redacted]d here in MAU reporting: was at work, went on break. Went to use the restroom, saw blood and more when she wiped.  Aching in lower back  Onset of complaint: back pain off and on Pain score: mild Vitals:   06/06/23 0940 06/06/23 0944  BP: (!) 174/105 (!) 163/96  Pulse: 81 77  Resp: 18   Temp: 98.2 F (36.8 C)   SpO2: 98%      FHT: Lab orders placed from triage:    Did take BP med this morning Recheck in lower arm

## 2023-06-06 NOTE — MAU Provider Note (Signed)
Chief Complaint: Vaginal Bleeding and Back Pain   Event Date/Time   First Provider Initiated Contact with Patient 06/06/23 1154      SUBJECTIVE HPI: Laura Gutierrez is a 37 y.o. G2P1001 at [redacted]w[redacted]d by LMP who presents to maternity admissions reporting light bleeding when wiping today at work and lower back pain.  She has HTN and took her labetalol this morning.    HPI  Past Medical History:  Diagnosis Date   Bronchitis    Gestational diabetes    Gonorrhea    Hypertension    Morbid obesity (HCC)    Obesity    Syphilis    UTI (urinary tract infection)    Past Surgical History:  Procedure Laterality Date   PERINEAL LACERATION REPAIR N/A 07/13/2020   Procedure: SUTURE REPAIR PERINEAL LACERATION;  Surgeon: Tereso Newcomer, MD;  Location: [facility];  Service: Gynecology;  Laterality: N/A;   TONSILLECTOMY     Social History   Socioeconomic History   Marital status: Single    Spouse name: Not on file   Number of children: Not on file   Years of education: Not on file   Highest education level: Not on file  Occupational History   Not on file  Tobacco Use   Smoking status: Every Day    Current packs/day: 1.00    Average packs/day: 1 pack/day for 18.7 years (18.7 ttl pk-yrs)    Types: Cigarettes    Start date: 2006   Smokeless tobacco: Never   Tobacco comments:    Cutting back 06/06/23  Vaping Use   Vaping status: Never Used  Substance and Sexual Activity   Alcohol use: Not Currently    Alcohol/week: 1.0 standard drink of alcohol    Types: 1 Glasses of wine per week    Comment: sometimes     Drug use: No   Sexual activity: Not Currently    Birth control/protection: None  Other Topics Concern   Not on file  Social History Narrative   Not on file   Social Determinants of Health   Financial Resource Strain: Not on file  Food Insecurity: Not on file  Transportation Needs: Not on file  Physical Activity: Not on file  Stress: Not on file  Social Connections: Not on file   Intimate Partner Violence: Not on file   No current facility-administered medications on file prior to encounter.   Current Outpatient Medications on File Prior to Encounter  Medication Sig Dispense Refill   pantoprazole (PROTONIX) 40 MG tablet Take 1 tablet (40 mg total) by mouth 2 (two) times daily before a meal. 60 tablet 2   Prenat-FeCbn-FeAsp-Meth-FA-DHA (PRENATE MINI) 18-0.6-0.4-350 MG CAPS Take 1 tablet by mouth daily. 30 capsule 11   amLODipine (NORVASC) 10 MG tablet Take 1 tablet (10 mg total) by mouth daily. (Patient not taking: Reported on 05/25/2023) 90 tablet 3   carvedilol (COREG) 12.5 MG tablet Take 1 tablet (12.5 mg total) by mouth 2 (two) times daily with a meal. (Patient not taking: Reported on 05/25/2023) 60 tablet 3   losartan-hydrochlorothiazide (HYZAAR) 100-25 MG tablet Take 1 tablet by mouth daily. (Patient not taking: Reported on 05/25/2023) 90 tablet 3   Allergies  Allergen Reactions   Bactrim [Sulfamethoxazole-Trimethoprim] Anaphylaxis   Latex     itching    ROS:  Review of Systems  Constitutional:  Negative for chills, fatigue and fever.  Respiratory:  Negative for shortness of breath.   Cardiovascular:  Negative for chest pain.  Gastrointestinal:  Negative for abdominal pain, nausea and vomiting.  Genitourinary:  Positive for vaginal bleeding. Negative for difficulty urinating, dysuria, flank pain, pelvic pain, vaginal discharge and vaginal pain.  Musculoskeletal:  Positive for back pain.  Neurological:  Negative for dizziness and headaches.  Psychiatric/Behavioral: Negative.       I have reviewed patient's Past Medical Hx, Surgical Hx, Family Hx, Social Hx, medications and allergies.   Physical Exam  Patient Vitals for the past 24 hrs:  BP Temp Temp src Pulse Resp SpO2 Height Weight  06/06/23 0944 (!) 163/96 -- -- 77 -- -- -- --  06/06/23 0940 (!) 174/105 98.2 F (36.8 C) Oral 81 18 98 % 5\' 6"  (1.676 m) (!) 160.6 kg   Constitutional:  Well-developed, well-nourished female in no acute distress.  Cardiovascular: normal rate Respiratory: normal effort GI: Abd soft, non-tender. Pos BS x 4 MS: Extremities nontender, no edema, normal ROM Neurologic: Alert and oriented x 4.  GU: Neg CVAT.  PELVIC EXAM: Self swabs collected   LAB RESULTS Results for orders placed or performed during the hospital encounter of 06/06/23 (from the past 24 hour(s))  Wet prep, genital     Status: Abnormal   Collection Time: 06/06/23 10:29 AM  Result Value Ref Range   Yeast Wet Prep HPF POC NONE SEEN NONE SEEN   Trich, Wet Prep NONE SEEN NONE SEEN   Clue Cells Wet Prep HPF POC PRESENT (A) NONE SEEN   WBC, Wet Prep HPF POC <10 <10   Sperm NONE SEEN   CBC     Status: Abnormal   Collection Time: 06/06/23 10:32 AM  Result Value Ref Range   WBC 9.2 4.0 - 10.5 K/uL   RBC 4.30 3.87 - 5.11 MIL/uL   Hemoglobin 10.9 (L) 12.0 - 15.0 g/dL   HCT 51.7 61.6 - 07.3 %   MCV 85.8 80.0 - 100.0 fL   MCH 25.3 (L) 26.0 - 34.0 pg   MCHC 29.5 (L) 30.0 - 36.0 g/dL   RDW 71.0 (H) 62.6 - 94.8 %   Platelets 221 150 - 400 K/uL   nRBC 0.0 0.0 - 0.2 %  hCG, quantitative, pregnancy     Status: Abnormal   Collection Time: 06/06/23 10:32 AM  Result Value Ref Range   hCG, Beta Chain, Quant, S 4,347 (H) <5 mIU/mL       IMAGING US OB LESS THAN 14 WEEKS WITH OB TRANSVAGINAL  Result Date: 06/06/2023 CLINICAL DATA:  [id number] Vaginal bleeding in pregnancy, first trimester [id number] EXAM: OBSTETRIC <14 WK [place] AND TRANSVAGINAL OB US TECHNIQUE: Both transabdominal and transvaginal ultrasound examinations were performed for complete evaluation of the gestation as well as the maternal uterus, adnexal regions, and pelvic cul-de-sac. Transvaginal technique was performed to assess early pregnancy. COMPARISON:  None Available. FINDINGS: Intrauterine gestational sac: Single Yolk sac:  Visualized. Embryo:  Visualized. Cardiac Activity: Not Visualized. Heart Rate: 0 bpm CRL:  10.7 mm   7 w    1 d Subchorionic hemorrhage:  Small. Maternal uterus/adnexae: Ovaries were not well seen. No adnexal mass identified. No free fluid within the pelvis. IMPRESSION: 1. Intrauterine gestational sac containing embryo with no cardiac activity. Findings meet definitive criteria for failed pregnancy. This follows SRU consensus guidelines: Diagnostic Criteria for Nonviable Pregnancy Early in the First Trimester. [facility] Mis J Med [phone]. 2. Small subchronic hemorrhage. Electronically Signed   By: Duanne Guess D.O.   On: 06/06/2023 11:42    MAU Management/MDM: Orders Placed This Encounter  Procedures  Wet prep, genital   US OB LESS THAN 14 WEEKS WITH OB TRANSVAGINAL   CBC   hCG, quantitative, pregnancy   Discharge patient    Meds ordered this encounter  Medications   labetalol (NORMODYNE) 200 MG tablet    Sig: Take 2 tablets (400 mg total) by mouth 3 (three) times daily.    Dispense:  240 tablet    Refill:  0    Order Specific Question:   Supervising Provider    Answer:   Warden Fillers [[id number]]   misoprostol (CYTOTEC) 200 MCG tablet    Sig: Place all 4 tablets in your cheek and let them soften, then swallow them.    Dispense:  4 tablet    Refill:  1    Order Specific Question:   Supervising Provider    Answer:   Mariel Aloe A [[id number]]   promethazine (PHENERGAN) 25 MG tablet    Sig: Take 1 tablet (25 mg total) by mouth every 6 (six) hours as needed for nausea or vomiting.    Dispense:  30 tablet    Refill:  0    Order Specific Question:   Supervising Provider    Answer:   Mariel Aloe A [[id number]]   traMADol (ULTRAM) 50 MG tablet    Sig: Take 1-2 tablets (50-100 mg total) by mouth every 6 (six) hours as needed.    Dispense:  10 tablet    Refill:  0    Order Specific Question:   Supervising Provider    Answer:   Mariel Aloe A [[id number]]    US shows IUP at [redacted]w[redacted]d without fetal heart rate, c/w incomplete AB. Discussed results with pt, who is tearful and mourning  appropriately.  Pt without support person in MAU today so given time to call her support and talk about the loss and her options.   Discussed options with patient including expectant management, Cytotec Rx, or D&C scheduled in 2-3 days to 1 week.  Benefits/risks discussed for all options.  All options include close follow up in the office.  Pt chooses expectant management but may take Cytotec in a few days so Rx sent. Rx for  Cytotec, Phenergan, and Tramadol x 10 tablets.  Message sent to schedule [facility].    BP severe range today, pt not taking her prescribed BP medications prior to pregnancy and does not have any of the medicines still at home.  Increased Labetalol from 200 mg TID to 400 TID today and pt to f/u with PCP to restart blood pressure regimen for long term HTN management.    ASSESSMENT 1. Incomplete miscarriage   2. [redacted] weeks gestation of pregnancy   3. Essential (primary) hypertension     PLAN Discharge home Allergies as of 06/06/2023       Reactions   Bactrim [sulfamethoxazole-trimethoprim] Anaphylaxis   Latex    itching        Medication List     STOP taking these medications    amLODipine 10 MG tablet Commonly known as: NORVASC   carvedilol 12.5 MG tablet Commonly known as: COREG   losartan-hydrochlorothiazide 100-25 MG tablet Commonly known as: HYZAAR       TAKE these medications    labetalol 200 MG tablet Commonly known as: NORMODYNE Take 2 tablets (400 mg total) by mouth 3 (three) times daily. What changed: how much to take   misoprostol 200 MCG tablet Commonly known as: Cytotec Place all 4 tablets in your cheek and  let them soften, then swallow them.   pantoprazole 40 MG tablet Commonly known as: PROTONIX Take 1 tablet (40 mg total) by mouth 2 (two) times daily before a meal.   Prenate Mini 18-0.6-0.4-350 MG Caps Take 1 tablet by mouth daily.   promethazine 25 MG tablet Commonly known as: PHENERGAN Take 1 tablet (25 mg  total) by mouth every 6 (six) hours as needed for nausea or vomiting.   traMADol 50 MG tablet Commonly known as: ULTRAM Take 1-2 tablets (50-100 mg total) by mouth every 6 (six) hours as needed.        Follow-up Information     [facility] Follow up.   Specialty: Obstetrics and Gynecology Why: The clinic will call you with appointment in 2 weeks. Contact information: [address] [place] [address] [phone]        [facility] Follow up.   Specialty: Obstetrics and Gynecology Why: As needed for emergencies Contact information: [address] [place] [address] [phone]                Sharen Counter Certified Nurse-Midwife 06/06/2023  12:30 PM

## 2023-06-09 LAB — GC/CHLAMYDIA PROBE AMP (~~LOC~~) NOT AT ARMC
Chlamydia: NEGATIVE
Comment: NEGATIVE
Comment: NORMAL
Neisseria Gonorrhea: NEGATIVE

## 2023-06-23 ENCOUNTER — Encounter: Payer: Self-pay | Admitting: Student

## 2023-06-23 ENCOUNTER — Ambulatory Visit (INDEPENDENT_AMBULATORY_CARE_PROVIDER_SITE_OTHER): Payer: 59 | Admitting: Student

## 2023-06-23 VITALS — BP 166/113 | HR 78 | Wt 353.0 lb

## 2023-06-23 DIAGNOSIS — Z3A08 8 weeks gestation of pregnancy: Secondary | ICD-10-CM

## 2023-06-23 DIAGNOSIS — I10 Essential (primary) hypertension: Secondary | ICD-10-CM

## 2023-06-23 DIAGNOSIS — O034 Incomplete spontaneous abortion without complication: Secondary | ICD-10-CM | POA: Diagnosis not present

## 2023-06-23 NOTE — Progress Notes (Signed)
  History:  [name] is a 37 y.o. G2P1001 who presents to clinic today for follow-up after suspected miscarriage on 09/02. Patient presented to MAU at [redacted]w[redacted]d by LMP for vaginal bleeding and lower back pain. An Ultrasound was completed and there was no fetal heart tone detected. Patient was provided option for expectant management or at home Cytotec. Patient decided on Cytotec and reports passing of products. Is no longer bleeding or having back pain. .  The following portions of the patient's history were reviewed and updated as appropriate: allergies, current medications, family history, past medical history, social history, past surgical history and problem list.  Review of Systems:  Review of Systems  Constitutional: Negative.   Respiratory: Negative.    Gastrointestinal:  Negative for abdominal pain and nausea.  Genitourinary: Negative.   Musculoskeletal: Negative.   Neurological: Negative.   Psychiatric/Behavioral:         General sadness      Objective:  Physical Exam BP (!) 166/113   Pulse 78   Wt (!) 353 lb (160.1 kg)   LMP 04/08/2023   Breastfeeding No   BMI 56.98 kg/m  Physical Exam Constitutional:      Appearance: Normal appearance. She is obese.  Cardiovascular:     Rate and Rhythm: Normal rate.  Pulmonary:     Effort: Pulmonary effort is normal.  Abdominal:     Palpations: Abdomen is soft.  Genitourinary:    Comments: deferred Neurological:     Mental Status: She is alert and oriented to person, place, and time. Mental status is at baseline.  Psychiatric:        Attention and Perception: Attention and perception normal.        Mood and Affect: Mood is depressed.        Thought Content: Thought content normal.        Judgment: Judgment normal.     Labs and Imaging No results found for this or any previous visit (from the past 24 hour(s)).  No results found.  Health Maintenance Due  Topic Date Due   COVID-19 Vaccine (1) Never done   INFLUENZA  VACCINE  Never done    Labs, imaging and previous visits in Epic and Care Everywhere reviewed  Assessment & Plan:  1. Incomplete miscarriage - Beta hCG quant (ref lab) - Reviewed all forms of birth control options available including abstinence; over the counter/barrier methods; hormonal contraceptive medication including pill, patch, ring, injection,contraceptive implant; hormonal and nonhormonal IUDs; permanent sterilization options including vasectomy and the various tubal sterilization modalities. Risks and benefits reviewed.  Questions were answered.  Information was given to patient to review.  Discussed the significance of obtaining blood pressure control prior to starting certain methods. Currently deciding between hormonal IUD and Nexplanon  2. Hypertension, unspecified type - Severely elevated in office today - Currently taking Labetalol  - Strongly encouraged patient to seek evaluation at emergency department due to severity of blood pressure readings today   Approximately 20 minutes of total time was spent with this patient on counseling and coordination of care  Return for Per lab result .  Corlis Hove, NP 06/23/2023 1:10 PM

## 2023-06-23 NOTE — Progress Notes (Signed)
Pt presents for SAB f/u.  Reports that bleeding has stopped.  BP elevated x2 today in office.

## 2023-06-24 LAB — BETA HCG QUANT (REF LAB): hCG Quant: 1 m[IU]/mL

## 2023-06-25 ENCOUNTER — Encounter: Payer: Self-pay | Admitting: Student

## 2023-06-29 ENCOUNTER — Encounter (INDEPENDENT_AMBULATORY_CARE_PROVIDER_SITE_OTHER): Payer: Self-pay | Admitting: Primary Care

## 2023-06-29 MED ORDER — CARVEDILOL 12.5 MG PO TABS
12.5000 mg | ORAL_TABLET | Freq: Two times a day (BID) | ORAL | 1 refills | Status: DC
Start: 2023-06-29 — End: 2024-01-16

## 2023-06-29 MED ORDER — AMLODIPINE BESYLATE 10 MG PO TABS
10.0000 mg | ORAL_TABLET | Freq: Every day | ORAL | 1 refills | Status: DC
Start: 2023-06-29 — End: 2024-01-16

## 2023-07-05 ENCOUNTER — Encounter: Payer: 59 | Admitting: Family Medicine

## 2023-07-26 ENCOUNTER — Ambulatory Visit: Payer: 59 | Admitting: Obstetrics and Gynecology

## 2023-09-07 ENCOUNTER — Encounter (INDEPENDENT_AMBULATORY_CARE_PROVIDER_SITE_OTHER): Payer: Self-pay | Admitting: Primary Care

## 2023-09-07 ENCOUNTER — Ambulatory Visit (INDEPENDENT_AMBULATORY_CARE_PROVIDER_SITE_OTHER): Payer: Self-pay | Admitting: *Deleted

## 2023-09-07 NOTE — Telephone Encounter (Signed)
Noted  

## 2023-09-07 NOTE — Telephone Encounter (Signed)
Pt returned call. Spoke with pt and offered an appt for Wednesday December 11. Pt states that is to far away and she is needing recommendations for the diarrhea. Made pt aware that I will have to forward message to provider. Pt states she understands

## 2023-09-07 NOTE — Telephone Encounter (Signed)
Message from [place] F sent at 09/07/2023  1:41 PM EST  Summary: Diarrhea   Pt is calling in because she has had diarrhea since Monday night at 9 PM. Pt says she cannot eat or drink anything without immediately having to use the bathroom. Appointment available 12/10 but pt says she cannot wait that long due to feeling dehydrated and not being able to stomach food.        Second attempt to call pt. LM on VM to call back.

## 2023-09-07 NOTE — Telephone Encounter (Signed)
Called pt to schedule apt. I was able to leave a VM for pt.

## 2023-09-07 NOTE — Telephone Encounter (Addendum)
Message from [place] F sent at 09/07/2023  1:41 PM EST  Summary: Diarrhea   Pt is calling in because she has had diarrhea since Monday night at 9 PM. Pt says she cannot eat or drink anything without immediately having to use the bathroom. Appointment available 12/10 but pt says she cannot wait that long due to feeling dehydrated and not being able to stomach food.           Called pt and LM on VM to call back. Since this is the third attempt, routing back to [facility]

## 2023-09-07 NOTE — Telephone Encounter (Signed)
Message from [place] F sent at 09/07/2023  1:41 PM EST  Summary: Diarrhea   Pt is calling in because she has had diarrhea since Monday night at 9 PM. Pt says she cannot eat or drink anything without immediately having to use the bathroom. Appointment available 12/10 but pt says she cannot wait that long due to feeling dehydrated and not being able to stomach food.          Call History  Contact Date/Time Type Contact Phone/Fax User  09/07/2023 01:40 PM EST Phone ([address]) [place], [place] (Self) [phone] Judie Petit) Colon Flattery M

## 2023-09-07 NOTE — Telephone Encounter (Signed)
Attempted to return her call.   Left a voicemail to call back. 

## 2023-09-08 ENCOUNTER — Encounter (HOSPITAL_BASED_OUTPATIENT_CLINIC_OR_DEPARTMENT_OTHER): Payer: Self-pay | Admitting: Emergency Medicine

## 2023-09-08 ENCOUNTER — Other Ambulatory Visit (HOSPITAL_BASED_OUTPATIENT_CLINIC_OR_DEPARTMENT_OTHER): Payer: Self-pay

## 2023-09-08 ENCOUNTER — Encounter (INDEPENDENT_AMBULATORY_CARE_PROVIDER_SITE_OTHER): Payer: Self-pay | Admitting: Primary Care

## 2023-09-08 ENCOUNTER — Emergency Department (HOSPITAL_BASED_OUTPATIENT_CLINIC_OR_DEPARTMENT_OTHER): Payer: 59

## 2023-09-08 ENCOUNTER — Other Ambulatory Visit: Payer: Self-pay

## 2023-09-08 ENCOUNTER — Ambulatory Visit (INDEPENDENT_AMBULATORY_CARE_PROVIDER_SITE_OTHER): Payer: Self-pay

## 2023-09-08 ENCOUNTER — Emergency Department (HOSPITAL_BASED_OUTPATIENT_CLINIC_OR_DEPARTMENT_OTHER)
Admission: EM | Admit: 2023-09-08 | Discharge: 2023-09-08 | Disposition: A | Discharge status: Home / Self Care | Payer: 59 | Attending: Emergency Medicine | Admitting: Emergency Medicine

## 2023-09-08 DIAGNOSIS — K8020 Calculus of gallbladder without cholecystitis without obstruction: Secondary | ICD-10-CM | POA: Diagnosis not present

## 2023-09-08 DIAGNOSIS — I10 Essential (primary) hypertension: Secondary | ICD-10-CM | POA: Diagnosis not present

## 2023-09-08 DIAGNOSIS — Z1152 Encounter for screening for COVID-19: Secondary | ICD-10-CM | POA: Insufficient documentation

## 2023-09-08 DIAGNOSIS — Z91040 Latex allergy status: Secondary | ICD-10-CM | POA: Diagnosis not present

## 2023-09-08 DIAGNOSIS — R1013 Epigastric pain: Secondary | ICD-10-CM | POA: Diagnosis present

## 2023-09-08 DIAGNOSIS — Z79899 Other long term (current) drug therapy: Secondary | ICD-10-CM | POA: Insufficient documentation

## 2023-09-08 LAB — COMPREHENSIVE METABOLIC PANEL
ALT: 22 U/L (ref 0–44)
AST: 32 U/L (ref 15–41)
Albumin: 4 g/dL (ref 3.5–5.0)
Alkaline Phosphatase: 88 U/L (ref 38–126)
Anion gap: 7 (ref 5–15)
BUN: 18 mg/dL (ref 6–20)
CO2: 22 mmol/L (ref 22–32)
Calcium: 8.8 mg/dL — ABNORMAL LOW (ref 8.9–10.3)
Chloride: 108 mmol/L (ref 98–111)
Creatinine, Ser: 0.94 mg/dL (ref 0.44–1.00)
GFR, Estimated: >60 mL/min (ref >60)
Glucose, Bld: 93 mg/dL (ref 70–99)
Potassium: 4.7 mmol/L (ref 3.5–5.1)
Sodium: 137 mmol/L (ref 135–145)
Total Bilirubin: 0.4 mg/dL (ref <1.2)
Total Protein: 7.2 g/dL (ref 6.5–8.1)

## 2023-09-08 LAB — RESP PANEL BY RT-PCR (RSV, FLU A&B, COVID)  RVPGX2
Influenza A by PCR: NEGATIVE
Influenza B by PCR: NEGATIVE
Resp Syncytial Virus by PCR: NEGATIVE
SARS Coronavirus 2 by RT PCR: NEGATIVE

## 2023-09-08 LAB — CBC
HCT: 35.7 % — ABNORMAL LOW (ref 36.0–46.0)
Hemoglobin: 10.9 g/dL — ABNORMAL LOW (ref 12.0–15.0)
MCH: 26.3 pg (ref 26.0–34.0)
MCHC: 30.5 g/dL (ref 30.0–36.0)
MCV: 86 fL (ref 80.0–100.0)
Platelets: 248 10*3/uL (ref 150–400)
RBC: 4.15 MIL/uL (ref 3.87–5.11)
RDW: 17.6 % — ABNORMAL HIGH (ref 11.5–15.5)
WBC: 6 10*3/uL (ref 4.0–10.5)
nRBC: 0 % (ref 0.0–0.2)

## 2023-09-08 LAB — LIPASE, BLOOD: Lipase: 27 U/L (ref 11–51)

## 2023-09-08 MED ORDER — MORPHINE SULFATE (PF) 4 MG/ML IV SOLN
6.0000 mg | Freq: Once | INTRAVENOUS | Status: AC
  Administered 2023-09-08: 6 mg via INTRAVENOUS
  Filled 2023-09-08: qty 2

## 2023-09-08 MED ORDER — OXYCODONE-ACETAMINOPHEN 5-325 MG PO TABS
1.0000 | ORAL_TABLET | Freq: Three times a day (TID) | ORAL | 0 refills | Status: DC | PRN
Start: 2023-09-08 — End: 2023-10-21
  Filled 2023-09-08: qty 6, 2d supply, fill #0

## 2023-09-08 MED ORDER — ONDANSETRON 8 MG PO TBDP
8.0000 mg | ORAL_TABLET | Freq: Three times a day (TID) | ORAL | 0 refills | Status: DC | PRN
  Filled 2023-09-08: qty 20, 7d supply, fill #0

## 2023-09-08 MED ORDER — ONDANSETRON HCL 4 MG/2ML IJ SOLN
4.0000 mg | Freq: Once | INTRAMUSCULAR | Status: AC
  Administered 2023-09-08: 4 mg via INTRAVENOUS
  Filled 2023-09-08: qty 2

## 2023-09-08 MED ORDER — PANTOPRAZOLE SODIUM 40 MG IV SOLR
40.0000 mg | Freq: Once | INTRAVENOUS | Status: AC
  Administered 2023-09-08: 40 mg via INTRAVENOUS
  Filled 2023-09-08: qty 10

## 2023-09-08 NOTE — ED Triage Notes (Signed)
Pt c/o epigastric abdominal pain, diarrhea and nausea that started Monday. No vomiting. Pain worsening after eating. Sent here by PCP for further testing.  Recent miscarriage in September

## 2023-09-08 NOTE — ED Notes (Signed)

## 2023-09-08 NOTE — Telephone Encounter (Signed)
Will forward to provider  

## 2023-09-08 NOTE — ED Provider Notes (Signed)
[place] EMERGENCY DEPARTMENT AT [facility] Provider Note   CSN: [id number] Arrival date & time: 09/08/23  4098     History  Chief Complaint  Patient presents with   Abdominal Pain   Nausea   Diarrhea    Laura Gutierrez is a 37 y.o. female.  HPI    37 year old patient comes in with chief complaint of epigastric abdominal pain, nausea, diarrhea.  Symptoms have been present now for 4 days.  Patient states that her pain is typically exaggerated after something to eat or drink.  Nausea also gets worse after she has something to eat.  She has not had any emesis.  Patient did have loose bowel movements today.  Patient's past medical history includes hypertension, BMI over 50.  Patient denies any history of diabetes, cholelithiasis or abdominal surgical history.  No history of abdominal ulcers.  Pain is described as moderate to severe, epigastric pain that will wax and wane in intensity.  Pain is not constant.   Home Medications Prior to Admission medications   Medication Sig Start Date End Date Taking? Authorizing Provider  amLODipine (NORVASC) 10 MG tablet Take 1 tablet (10 mg total) by mouth daily. 06/29/23  Yes Grayce Sessions, NP  ondansetron (ZOFRAN-ODT) 8 MG disintegrating tablet Take 1 tablet (8 mg total) by mouth every 8 (eight) hours as needed for nausea. 09/08/23  Yes Derwood Kaplan, MD  oxyCODONE-acetaminophen (PERCOCET/ROXICET) 5-325 MG tablet Take 1 tablet by mouth every 8 (eight) hours as needed for severe pain (pain score 7-10). 09/08/23  Yes Derwood Kaplan, MD  carvedilol (COREG) 12.5 MG tablet Take 1 tablet (12.5 mg total) by mouth 2 (two) times daily with a meal. 06/29/23   Grayce Sessions, NP      Allergies    Bactrim [sulfamethoxazole-trimethoprim] and Latex    Review of Systems   Review of Systems  All other systems reviewed and are negative.   Physical Exam Updated Vital Signs BP (!) 163/98   Pulse 72   Temp 98 F (36.7 C) (Oral)    Resp 18   Ht 5\' 6"  (1.676 m)   Wt (!) 164.2 kg   LMP 08/08/2023   SpO2 100%   Breastfeeding No   BMI 58.43 kg/m  Physical Exam Vitals and nursing note reviewed.  Constitutional:      Appearance: She is well-developed.  HENT:     Head: Atraumatic.  Cardiovascular:     Rate and Rhythm: Normal rate.  Pulmonary:     Effort: Pulmonary effort is normal.  Abdominal:     Tenderness: There is abdominal tenderness in the right upper quadrant and epigastric area. There is no guarding or rebound. Negative signs include Murphy's sign.  Musculoskeletal:     Cervical back: Normal range of motion and neck supple.  Skin:    General: Skin is warm and dry.  Neurological:     Mental Status: She is alert and oriented to person, place, and time.     ED Results / Procedures / Treatments   Labs (all labs ordered are listed, but only abnormal results are displayed) Labs Reviewed  COMPREHENSIVE METABOLIC PANEL - Abnormal; Notable for the following components:      Result Value   Calcium 8.8 (*)    All other components within normal limits  CBC - Abnormal; Notable for the following components:   Hemoglobin 10.9 (*)    HCT 35.7 (*)    RDW 17.6 (*)    All other components within  normal limits  RESP PANEL BY RT-PCR (RSV, FLU A&B, COVID)  RVPGX2  LIPASE, BLOOD    EKG None  Radiology US Abdomen Limited RUQ (LIVER/GB)  Result Date: 09/08/2023 CLINICAL DATA:  Epigastric abdominal pain EXAM: ULTRASOUND ABDOMEN LIMITED RIGHT UPPER QUADRANT COMPARISON:  None available FINDINGS: Gallbladder: Gallstones: Present Sludge: None Gallbladder Wall: Within normal limits Pericholecystic fluid: None Sonographic Murphy's Sign: Negative per technologist Common bile duct: Diameter: 3 mm Liver: Parenchymal echogenicity: Within normal limits Contours: Normal Lesions: None Portal vein: Patent.  Hepatopetal flow Other: None. IMPRESSION: Moderate cholelithiasis without additional sonographic evidence of acute  cholecystitis. Electronically Signed   By: Acquanetta Belling M.D.   On: 09/08/2023 11:38    Procedures Procedures    Medications Ordered in ED Medications  ondansetron (ZOFRAN) injection 4 mg (4 mg Intravenous Given 09/08/23 1124)  morphine (PF) 4 MG/ML injection 6 mg (6 mg Intravenous Given 09/08/23 1124)  pantoprazole (PROTONIX) injection 40 mg (40 mg Intravenous Given 09/08/23 1124)    ED Course/ Medical Decision Making/ A&P                                 Medical Decision Making Amount and/or Complexity of Data Reviewed Labs: ordered. Radiology: ordered.  Risk Prescription drug management.   This patient presents to the ED with chief complaint(s) of abdominal pain, nausea, diarrhea with pertinent past medical history of no abdominal surgeries.The complaint involves an extensive differential diagnosis and also carries with it a high risk of complications and morbidity.    The differential diagnosis includes  Pancreatitis, Hepatobiliary pathology including cholelithiasis and cholecystitis, Gastritis/peptic ulcer disease, small bowel obstruction, gastroenteritis, viral illness including COVID-19 or flu.  Patient is having URI-like symptoms as well.  No sick contacts.  The initial plan is to get basic labs, pain medicine and ultrasound abdomen. If the initial workup including labs is reassuring, then we will start oral challenge.  No indication for CT at this time.   Additional history obtained: Records reviewed  previous hospital visits/encounters.  Patient has no abdominal imaging that is recent.  She did have ultrasound OB in August which revealed miscarriage.  Independent labs interpretation:  The following labs were independently interpreted: Patient's lab workup is reassuring without any evidence of profound leukocytosis, sepsis, elevated lipase or elevated bilirubin.   Treatment and Reassessment: On repeat assessment, patient has no abdominal pain.  On exam she does have  some epigastric tenderness.  I discussed with her that the ultrasound is indicated of cholelithiasis.  Family at the bedside states that when patient had Docia Furl, her symptoms did get worse and they have noticed association of pain with certain foods.  Plan is to treat this as cholelithiasis.  Patient has been advised to follow-up with general surgery.  Final Clinical Impression(s) / ED Diagnoses Final diagnoses:  Calculus of gallbladder without cholecystitis without obstruction    Rx / DC Orders ED Discharge Orders          Ordered    ondansetron (ZOFRAN-ODT) 8 MG disintegrating tablet  Every 8 hours PRN        09/08/23 1242    oxyCODONE-acetaminophen (PERCOCET/ROXICET) 5-325 MG tablet  Every 8 hours PRN        09/08/23 1242              Derwood Kaplan, MD 09/09/23 [phone]

## 2023-09-08 NOTE — Discharge Instructions (Addendum)
You are seen in the emergency room for abdominal pain, nausea.  Our workup indicates that you likely have gallstones.  Please call the surgical team at the number provided to set up an appointment.  Read the instructions provided on gallstones and the eating plan.  Return to the ER if you start having severe nausea, vomiting, severe abdominal pain, fevers or chills.

## 2023-09-08 NOTE — Telephone Encounter (Signed)
  Chief Complaint: severe abd pain  Symptoms: diarrhea, nausea and vomiting, if she takes anything po, she gets cramping and runs to [place] to have diarrhea Frequency: 5 day ago worsened overnight Pertinent Negatives: Patient denies chest pain, radiating pain, fever, urination pain Disposition: [x] ED /[] Urgent Care (no appt availability in office) / [] Appointment(In office/virtual)/ []  [facility]/ [] Home Care/ [] Refused Recommended Disposition /[] [place] [facility]/ []  Follow-up with PCP Additional Notes: advised pt to go to ED Reason for Disposition  [1] SEVERE pain (e.g., excruciating) AND [2] present > 1 hour  Answer Assessment - Initial Assessment Questions 1. LOCATION: "Where does it hurt?"      Above belly button  2. RADIATION: "Does the pain shoot anywhere else?" (e.g., chest, back)     no 3. ONSET: "When did the pain begin?" (e.g., minutes, hours or days ago)      Cramping  4. SUDDEN: "Gradual or sudden onset?"     Gradual  5. PATTERN "Does the pain come and go, or is it constant?"    - If it comes and goes: "How long does it last?" "Do you have pain now?"     (Note: Comes and goes means the pain is intermittent. It goes away completely between bouts.)    - If constant: "Is it getting better, staying the same, or getting worse?"      (Note: Constant means the pain never goes away completely; most serious pain is constant and gets worse.)      Comes and  goes 6. SEVERITY: "How bad is the pain?"  (e.g., Scale 1-10; mild, moderate, or severe)    - MILD (1-3): Doesn't interfere with normal activities, abdomen soft and not tender to touch.     - MODERATE (4-7): Interferes with normal activities or awakens from sleep, abdomen tender to touch.     - SEVERE (8-10): Excruciating pain, doubled over, unable to do any normal activities.       severe 8. CAUSE: "What do you think is causing the stomach pain?"     Unsure  10. OTHER SYMPTOMS: "Do you have any other symptoms?"  (e.g., back pain, diarrhea, fever, urination pain, vomiting)       Nausea, diarrhea, drinking anything causes diarrhea 11. PREGNANCY: "Is there any chance you are pregnant?" "When was your last menstrual period?"       N/a  Protocols used: Abdominal Pain - [facility]

## 2023-09-21 ENCOUNTER — Telehealth: Payer: Self-pay | Admitting: Primary Care

## 2023-09-21 NOTE — Telephone Encounter (Signed)
Copied from CRM [id number]. Topic: Referral - Request for Referral >> Sep 21, 2023  4:39 PM Priscille Loveless wrote: Referral Request - Did the patient discuss referral with their provider in the last year? No   Appointment offered? No  Type of order/referral and detailed reason for visit: Pt visited the ER dept and found out that she has gallstones and is needing a referral to have her gallbladder removed  Preference of office, provider, location:   If referral order, have you been seen by this specialty before? Yes (ER dept) (If Yes, this issue or another issue? When? Where?  Can we respond through MyChart? Yes

## 2023-09-22 ENCOUNTER — Other Ambulatory Visit (INDEPENDENT_AMBULATORY_CARE_PROVIDER_SITE_OTHER): Payer: Self-pay | Admitting: Primary Care

## 2023-09-22 ENCOUNTER — Encounter (INDEPENDENT_AMBULATORY_CARE_PROVIDER_SITE_OTHER): Payer: Self-pay | Admitting: Primary Care

## 2023-09-22 DIAGNOSIS — K8020 Calculus of gallbladder without cholecystitis without obstruction: Secondary | ICD-10-CM

## 2023-09-22 NOTE — Telephone Encounter (Signed)
Please place referral

## 2023-10-05 DIAGNOSIS — K8020 Calculus of gallbladder without cholecystitis without obstruction: Secondary | ICD-10-CM

## 2023-10-05 HISTORY — DX: Calculus of gallbladder without cholecystitis without obstruction: K80.20

## 2023-10-11 ENCOUNTER — Ambulatory Visit: Payer: Self-pay | Admitting: General Surgery

## 2023-10-11 ENCOUNTER — Ambulatory Visit (INDEPENDENT_AMBULATORY_CARE_PROVIDER_SITE_OTHER): Payer: Managed Care, Other (non HMO) | Admitting: General Surgery

## 2023-10-11 ENCOUNTER — Telehealth: Payer: Self-pay | Admitting: General Surgery

## 2023-10-11 ENCOUNTER — Encounter: Payer: Self-pay | Admitting: General Surgery

## 2023-10-11 VITALS — BP 156/105 | HR 88 | Temp 98.4°F | Ht 66.0 in | Wt 322.6 lb

## 2023-10-11 DIAGNOSIS — K8050 Calculus of bile duct without cholangitis or cholecystitis without obstruction: Secondary | ICD-10-CM

## 2023-10-11 DIAGNOSIS — K8020 Calculus of gallbladder without cholecystitis without obstruction: Secondary | ICD-10-CM | POA: Diagnosis not present

## 2023-10-11 NOTE — Telephone Encounter (Signed)
 Left message for patient to call, please inform her of the following regarding scheduled surgery with Dr. Marinda.   Pre-Admission date/time, and Surgery date at [facility]  Surgery Date: 10/21/23 Preadmission Testing Date: 10/18/23 (phone 8a-1p)  Also patient will need to call at [phone], between 1-3:00pm the day before surgery, to find out what time to arrive for surgery.

## 2023-10-11 NOTE — Patient Instructions (Signed)
 You have requested to have your gallbladder removed. This will be done at [facility] with Dr. Maurine Minister.  You will most likely be out of work 1-2 weeks for this surgery.  If you have FMLA or disability paperwork that needs filled out you may drop this off at our office or this can be faxed to [phone].  You will return after your post-op appointment with a lifting restriction for approximately 4 more weeks.  You will be able to eat anything you would like to following surgery. But, start by eating a bland diet and advance this as tolerated. The Gallbladder diet is below, please go as closely by this diet as possible prior to surgery to avoid any further attacks.  Please see the (blue)pre-care form that you have been given today. Our surgery scheduler will call you to verify surgery date and to go over information.   If you have any questions, please call our office.  Laparoscopic Cholecystectomy Laparoscopic cholecystectomy is surgery to remove the gallbladder. The gallbladder is located in the upper right part of the abdomen, behind the liver. It is a storage sac for bile, which is produced in the liver. Bile aids in the digestion and absorption of fats. Cholecystectomy is often done for inflammation of the gallbladder (cholecystitis). This condition is usually caused by a buildup of gallstones (cholelithiasis) in the gallbladder. Gallstones can block the flow of bile, and that can result in inflammation and pain. In severe cases, emergency surgery may be required. If emergency surgery is not required, you will have time to prepare for the procedure. Laparoscopic surgery is an alternative to open surgery. Laparoscopic surgery has a shorter recovery time. Your common bile duct may also need to be examined during the procedure. If stones are found in the common bile duct, they may be removed. LET [facility] CARE PROVIDER KNOW ABOUT: Any allergies you have. All medicines you are taking,  including vitamins, herbs, eye drops, creams, and over-the-counter medicines. Previous problems you or members of your family have had with the use of anesthetics. Any blood disorders you have. Previous surgeries you have had.  Any medical conditions you have. RISKS AND COMPLICATIONS Generally, this is a safe procedure. However, problems may occur, including: Infection. Bleeding. Allergic reactions to medicines. Damage to other structures or organs. A stone remaining in the common bile duct. A bile leak from the cyst duct that is clipped when your gallbladder is removed. The need to convert to open surgery, which requires a larger incision in the abdomen. This may be necessary if your surgeon thinks that it is not safe to continue with a laparoscopic procedure. BEFORE THE PROCEDURE Ask your health care provider about: Changing or stopping your regular medicines. This is especially important if you are taking diabetes medicines or blood thinners. Taking medicines such as aspirin and ibuprofen. These medicines can thin your blood. Do not take these medicines before your procedure if your health care provider instructs you not to. Follow instructions from your health care provider about eating or drinking restrictions. Let your health care provider know if you develop a cold or an infection before surgery. Plan to have someone take you home after the procedure. Ask your health care provider how your surgical site will be marked or identified. You may be given antibiotic medicine to help prevent infection. PROCEDURE To reduce your risk of infection: Your health care team will wash or sanitize their hands. Your skin will be washed with soap. An IV  tube may be inserted into one of your veins. You will be given a medicine to make you fall asleep (general anesthetic). A breathing tube will be placed in your mouth. The surgeon will make several small cuts (incisions) in your abdomen. A thin,  lighted tube (laparoscope) that has a tiny camera on the end will be inserted through one of the small incisions. The camera on the laparoscope will send a picture to a TV screen (monitor) in the operating room. This will give the surgeon a good view inside your abdomen. A gas will be pumped into your abdomen. This will expand your abdomen to give the surgeon more room to perform the surgery. Other tools that are needed for the procedure will be inserted through the other incisions. The gallbladder will be removed through one of the incisions. After your gallbladder has been removed, the incisions will be closed with stitches (sutures), staples, or skin glue. Your incisions may be covered with a bandage (dressing). The procedure may vary among health care providers and hospitals. AFTER THE PROCEDURE Your blood pressure, heart rate, breathing rate, and blood oxygen level will be monitored often until the medicines you were given have worn off. You will be given medicines as needed to control your pain.   This information is not intended to replace advice given to you by your health care provider. Make sure you discuss any questions you have with your health care provider.   Document Released: 09/20/2005 Document Revised: 06/11/2015 Document Reviewed: 05/02/2013 Elsevier Interactive Patient Education 2016 Else[employer] Inc.   Low-Fat Diet for Gallbladder Conditions A low-fat diet can be helpful if you have pancreatitis or a gallbladder condition. With these conditions, your pancreas and gallbladder have trouble digesting fats. A healthy eating plan with less fat will help rest your pancreas and gallbladder and reduce your symptoms. WHAT DO I NEED TO KNOW ABOUT THIS DIET? Eat a low-fat diet. Reduce your fat intake to less than 20-30% of your total daily calories. This is less than 50-60 g of fat per day. Remember that you need some fat in your diet. Ask your dietician what your daily goal should  be. Choose nonfat and low-fat healthy foods. Look for the words "nonfat," "low fat," or "fat free." As a guide, look on the label and choose foods with less than 3 g of fat per serving. Eat only one serving. Avoid alcohol. Do not smoke. If you need help quitting, talk with your health care provider. Eat small frequent meals instead of three large heavy meals. WHAT FOODS CAN I EAT? Grains Include healthy grains and starches such as potatoes, wheat bread, fiber-rich cereal, and brown rice. Choose whole grain options whenever possible. In adults, whole grains should account for 45-65% of your daily calories.  Fruits and Vegetables Eat plenty of fruits and vegetables. Fresh fruits and vegetables add fiber to your diet. Meats and Other Protein Sources Eat lean meat such as chicken and pork. Trim any fat off of meat before cooking it. Eggs, fish, and beans are other sources of protein. In adults, these foods should account for 10-35% of your daily calories. Dairy Choose low-fat milk and dairy options. Dairy includes fat and protein, as well as calcium.  Fats and Oils Limit high-fat foods such as fried foods, sweets, baked goods, sugary drinks.  Other Creamy sauces and condiments, such as mayonnaise, can add extra fat. Think about whether or not you need to use them, or use smaller amounts or low fat options.  WHAT FOODS ARE NOT RECOMMENDED? High fat foods, such as: [employer]. Ice cream. [place] toast. Sweet rolls. Pizza. Cheese bread. Foods covered with batter, butter, creamy sauces, or cheese. Fried foods. Sugary drinks and desserts. Foods that cause gas or bloating   This information is not intended to replace advice given to you by your health care provider. Make sure you discuss any questions you have with your health care provider.   Document Released: 09/25/2013 Document Reviewed: 09/25/2013 Elsevier Interactive Patient Education [employer]

## 2023-10-12 ENCOUNTER — Telehealth: Payer: Self-pay

## 2023-10-12 NOTE — Telephone Encounter (Signed)
Another message is left for patient to call.  ? ?

## 2023-10-12 NOTE — Progress Notes (Signed)
 Patient ID: Laura Gutierrez, female   DOB: October 17, 1985, 38 y.o.   MRN: [id number] CC: RUQ Pain, Diarrhea  History of Present Illness Anniah Gutierrez is a 38 y.o. female with past medical history significant for morbid obesity who presents consultation for right upper quadrant pain and diarrhea.  The patient reports that over the last several months she has had postprandial diarrhea as well as right upper quadrant pain.  This is also associated with some radiation to her epigastrium.  She describes the pain as sharp.  She says that after Thanksgiving the pain persisted so she went to the emergency department for evaluation.  There she was found cholelithiasis on her ultrasound.  She says that her pain resolved but over the last several weeks she has intermittently gotten the same diarrhea and right upper quadrant pain.  This is worse with spicy foods and foods with fat.  She denies any vomiting or constipation..  Past Medical History Past Medical History:  Diagnosis Date   Bronchitis    Gestational diabetes    Gonorrhea    Hypertension    Morbid obesity (HCC)    Obesity    Syphilis    UTI (urinary tract infection)       Past Surgical History:  Procedure Laterality Date   PERINEAL LACERATION REPAIR N/A 07/13/2020   Procedure: SUTURE REPAIR PERINEAL LACERATION;  Surgeon: Herchel Gloris LABOR, MD;  Location: [facility] LD ORS;  Service: Gynecology;  Laterality: N/A;   TONSILLECTOMY      Allergies  Allergen Reactions   Bactrim  [Sulfamethoxazole -Trimethoprim ] Anaphylaxis   Latex     itching    Current Outpatient Medications  Medication Sig Dispense Refill   amLODipine  (NORVASC ) 10 MG tablet Take 1 tablet (10 mg total) by mouth daily. 90 tablet 1   carvedilol  (COREG ) 12.5 MG tablet Take 1 tablet (12.5 mg total) by mouth 2 (two) times daily with a meal. 180 tablet 1   ondansetron  (ZOFRAN -ODT) 8 MG disintegrating tablet Take 1 tablet (8 mg total) by mouth every 8 (eight) hours as needed for nausea. 20  tablet 0   oxyCODONE -acetaminophen  (PERCOCET/ROXICET) 5-325 MG tablet Take 1 tablet by mouth every 8 (eight) hours as needed for severe pain (pain score 7-10). 6 tablet 0   No current facility-administered medications for this visit.    Family History Family History  Problem Relation Age of Onset   Hypertension Mother    Diabetes Mother    Healthy Father    Alzheimer's disease Maternal Aunt    Heart disease Maternal Grandfather    Diabetes Paternal Grandmother        Social History Social History   Tobacco Use   Smoking status: Every Day    Current packs/day: 1.00    Average packs/day: 1 pack/day for 19.0 years (19.0 ttl pk-yrs)    Types: Cigarettes    Start date: 2006   Smokeless tobacco: Never   Tobacco comments:    Cutting back 06/06/23  Vaping Use   Vaping status: Never Used  Substance Use Topics   Alcohol use: Not Currently    Alcohol/week: 1.0 standard drink of alcohol    Types: 1 Glasses of wine per week    Comment: sometimes     Drug use: No        ROS Full ROS of systems performed and is otherwise negative there than what is stated in the HPI  Physical Exam Blood pressure (!) 156/105, pulse 88, temperature 98.4 F (36.9 C), temperature source Oral, height  5' 6 (1.676 m), weight (!) 322 lb 9.6 oz (146.3 kg), SpO2 100%.  Alert and oriented x 3, PERRLA, clear to auscultation bilaterally, regular rate and rhythm, moving all extremities spontaneously, abdomen is soft, obese, mild tenderness in epigastrium, negative Murphy sign, skin is warm and dry  Data Reviewed I reviewed her ultrasound it is significant for cholelithiasis without gallbladder wall thickening or pericholecystic fluid.  Her labs are notable for a normal white count and a T bilirubin that is within normal limits.  I have personally reviewed the patient's imaging and medical records.    Assessment/Plan    Ms. Dagmar is a 38 year old who presents with biliary colic.  I discussed with her that  I recommend surgical intervention with a robotic assisted cholecystectomy.  I discussed the risk, benefits alternatives of the procedure including risk of infection, bleeding, bile leak, retained stone, injury to common bile duct, need for open procedure.  I also discussed that there is risk of hernia given her obesity.  I also discussed that given her obesity she is at increased risk for all complications.  She understands these risks and wishes to proceed.  We will use ICG     Jayson MALVA Endow 10/12/2023, 9:30 AM

## 2023-10-12 NOTE — Telephone Encounter (Signed)
 [name] returned Laura Gutierrez's call. I placed her on a brief hold to retrieve information needed. She hung before I could inform her. I called her back and was sent to voicemail requesting a call back. I will send a MyChart message with needed information

## 2023-10-13 NOTE — Telephone Encounter (Signed)
 Patient calls back she is now informed of all dates regarding surgery.

## 2023-10-13 NOTE — Telephone Encounter (Signed)
Another message is left for patient to call.  ? ?

## 2023-10-18 ENCOUNTER — Other Ambulatory Visit: Payer: Self-pay

## 2023-10-18 ENCOUNTER — Encounter
Admission: RE | Admit: 2023-10-18 | Discharge: 2023-10-18 | Disposition: A | Discharge status: Home / Self Care | Payer: 59 | Source: Ambulatory Visit | Attending: General Surgery | Admitting: General Surgery

## 2023-10-18 VITALS — Ht 66.0 in | Wt 322.0 lb

## 2023-10-18 DIAGNOSIS — I10 Essential (primary) hypertension: Secondary | ICD-10-CM

## 2023-10-18 DIAGNOSIS — Z01812 Encounter for preprocedural laboratory examination: Secondary | ICD-10-CM

## 2023-10-18 DIAGNOSIS — Z01810 Encounter for preprocedural cardiovascular examination: Secondary | ICD-10-CM

## 2023-10-18 HISTORY — DX: Morbid (severe) obesity due to excess calories: E66.01

## 2023-10-18 HISTORY — DX: Essential (primary) hypertension: I10

## 2023-10-18 NOTE — Patient Instructions (Addendum)
 Your procedure is scheduled on: Friday, January 17 Report to the Registration Desk on the 1st floor of the [facility] To find out your arrival time, please call [phone] between 1PM - 3PM on: Thursday, January 16 If your arrival time is 6:00 am, do not arrive before that time as the [facility] entrance doors do not open until 6:00 am.  REMEMBER: Instructions that are not followed completely may result in serious medical risk, up to and including death; or upon the discretion of your surgeon and anesthesiologist your surgery may need to be rescheduled.  Do not eat food after midnight the night before surgery.  No gum chewing or hard candies.  You may however, drink CLEAR liquids up to 2 hours before you are scheduled to arrive for your surgery. Do not drink anything within 2 hours of your scheduled arrival time.  Clear liquids include: - water   - apple juice without pulp - gatorade (not RED colors) - black coffee or tea (Do NOT add milk or creamers to the coffee or tea) Do NOT drink anything that is not on this list.  One week prior to surgery: Stop Anti-inflammatories (NSAIDS) such as Advil , Aleve, Ibuprofen , Motrin , Naproxen, Naprosyn and Aspirin  based products such as Excedrin, Goody's Powder, BC Powder. Stop ANY OVER THE COUNTER supplements until after surgery.  You may however, continue to take Tylenol  if needed for pain up until the day of surgery.  Continue taking all of your other prescription medications up until the day of surgery.  ON THE DAY OF SURGERY ONLY TAKE THESE MEDICATIONS WITH SIPS OF WATER :  Amlodipine  Carvedilol   No Alcohol for 24 hours before or after surgery.  No Smoking including e-cigarettes for 24 hours before surgery.  No chewable tobacco products for at least 6 hours before surgery.  No nicotine patches on the day of surgery.  Do not use any recreational drugs for at least a week (preferably 2 weeks) before your surgery.  Please be  advised that the combination of cocaine and anesthesia may have negative outcomes, up to and including death. If you test positive for cocaine, your surgery will be cancelled.  On the morning of surgery brush your teeth with toothpaste and water , you may rinse your mouth with mouthwash if you wish. Do not swallow any toothpaste or mouthwash.  Use CHG Soap as directed on instruction sheet.  Do not wear jewelry, make-up, hairpins, clips or nail polish.  For welded (permanent) jewelry: bracelets, anklets, waist bands, etc.  Please have this removed prior to surgery.  If it is not removed, there is a chance that hospital personnel will need to cut it off on the day of surgery.  Do not wear lotions, powders, or perfumes.   Do not shave body hair from the neck down 48 hours before surgery.  Contact lenses, hearing aids and dentures may not be worn into surgery.  Do not bring valuables to the hospital. [facility] is not responsible for any missing/lost belongings or valuables.   Notify your doctor if there is any change in your medical condition (cold, fever, infection).  Wear comfortable clothing (specific to your surgery type) to the hospital.  After surgery, you can help prevent lung complications by doing breathing exercises.  Take deep breaths and cough every 1-2 hours. Your doctor may order a device called an Incentive Spirometer to help you take deep breaths. When coughing or sneezing, hold a pillow firmly against your incision with both hands. This is  called "splinting." Doing this helps protect your incision. It also decreases belly discomfort.  If you are being discharged the day of surgery, you will not be allowed to drive home. You will need a responsible individual to drive you home and stay with you for 24 hours after surgery.   If you are taking public transportation, you will need to have a responsible individual with you.  Please call the Pre-admissions Testing Dept. at  [phone] if you have any questions about these instructions.  Surgery Visitation Policy:  Patients having surgery or a procedure may have two visitors.  Children under the age of 81 must have an adult with them who is not the patient.  Temporary Visitor Restrictions Due to increasing cases of flu, RSV and COVID-19: Children ages 20 and under will not be able to visit patients in [facility] hospitals under most circumstances.     Preparing for Surgery with CHLORHEXIDINE  GLUCONATE (CHG) Soap  Chlorhexidine  Gluconate (CHG) Soap  o An antiseptic cleaner that kills germs and bonds with the skin to continue killing germs even after washing  o Used for showering the night before surgery and morning of surgery  Before surgery, you can play an important role by reducing the number of germs on your skin.  CHG (Chlorhexidine  gluconate) soap is an antiseptic cleanser which kills germs and bonds with the skin to continue killing germs even after washing.  Please do not use if you have an allergy to CHG or antibacterial soaps. If your skin becomes reddened/irritated stop using the CHG.  1. Shower the NIGHT BEFORE SURGERY and the MORNING OF SURGERY with CHG soap.  2. If you choose to wash your hair, wash your hair first as usual with your normal shampoo.  3. After shampooing, rinse your hair and body thoroughly to remove the shampoo.  4. Use CHG as you would any other liquid soap. You can apply CHG directly to the skin and wash gently with a scrungie or a clean washcloth.  5. Apply the CHG soap to your body only from the neck down. Do not use on open wounds or open sores. Avoid contact with your eyes, ears, mouth, and genitals (private parts). Wash face and genitals (private parts) with your normal soap.  6. Wash thoroughly, paying special attention to the area where your surgery will be performed.  7. Thoroughly rinse your body with warm water .  8. Do not shower/wash with your  normal soap after using and rinsing off the CHG soap.  9. Pat yourself dry with a clean towel.  10. Wear clean pajamas to bed the night before surgery.  12. Place clean sheets on your bed the night of your first shower and do not sleep with pets.  13. Shower again with the CHG soap on the day of surgery prior to arriving at the hospital.  14. Do not apply any deodorants/lotions/powders.  15. Please wear clean clothes to the hospital.

## 2023-10-19 ENCOUNTER — Encounter: Payer: Self-pay | Admitting: Urgent Care

## 2023-10-19 ENCOUNTER — Encounter
Admission: RE | Admit: 2023-10-19 | Discharge: 2023-10-19 | Disposition: A | Discharge status: Home / Self Care | Payer: 59 | Source: Ambulatory Visit | Attending: General Surgery | Admitting: General Surgery

## 2023-10-19 DIAGNOSIS — Z01810 Encounter for preprocedural cardiovascular examination: Secondary | ICD-10-CM | POA: Insufficient documentation

## 2023-10-19 DIAGNOSIS — I10 Essential (primary) hypertension: Secondary | ICD-10-CM | POA: Diagnosis not present

## 2023-10-19 DIAGNOSIS — Z01812 Encounter for preprocedural laboratory examination: Secondary | ICD-10-CM

## 2023-10-20 MED ORDER — INDOCYANINE GREEN 25 MG IV SOLR
1.2500 mg | Freq: Once | INTRAVENOUS | Status: AC
  Administered 2023-10-21: 1.25 mg via INTRAVENOUS

## 2023-10-20 MED ORDER — SODIUM CHLORIDE 0.9 % IV SOLN
2.0000 g | INTRAVENOUS | Status: AC
  Administered 2023-10-21: 2 g via INTRAVENOUS

## 2023-10-20 MED ORDER — ORAL CARE MOUTH RINSE: 15.0000 mL | Freq: Once | OROMUCOSAL | Status: AC

## 2023-10-20 MED ORDER — LACTATED RINGERS IV SOLN: INTRAVENOUS | Status: DC

## 2023-10-20 MED ORDER — CHLORHEXIDINE GLUCONATE 0.12 % MT SOLN
15.0000 mL | Freq: Once | OROMUCOSAL | Status: AC
  Administered 2023-10-21: 15 mL via OROMUCOSAL

## 2023-10-20 MED ORDER — CHLORHEXIDINE GLUCONATE CLOTH 2 % EX PADS: 6.0000 | MEDICATED_PAD | Freq: Once | CUTANEOUS | Status: DC

## 2023-10-20 MED ORDER — CHLORHEXIDINE GLUCONATE CLOTH 2 % EX PADS
6.0000 | MEDICATED_PAD | Freq: Once | CUTANEOUS | Status: AC
  Administered 2023-10-21: 6 via TOPICAL

## 2023-10-21 ENCOUNTER — Ambulatory Visit: Payer: 59 | Admitting: Certified Registered"

## 2023-10-21 ENCOUNTER — Other Ambulatory Visit: Payer: Self-pay

## 2023-10-21 ENCOUNTER — Ambulatory Visit: Payer: 59 | Admitting: Urgent Care

## 2023-10-21 ENCOUNTER — Encounter: Payer: Self-pay | Admitting: General Surgery

## 2023-10-21 ENCOUNTER — Encounter
Admission: RE | Disposition: A | Discharge status: Home / Self Care | Payer: Self-pay | Source: Home / Self Care | Attending: General Surgery

## 2023-10-21 ENCOUNTER — Ambulatory Visit
Admission: RE | Admit: 2023-10-21 | Discharge: 2023-10-21 | Disposition: A | Discharge status: Home / Self Care | Payer: 59 | Attending: General Surgery | Admitting: General Surgery

## 2023-10-21 DIAGNOSIS — Z6841 Body Mass Index (BMI) 40.0 and over, adult: Secondary | ICD-10-CM | POA: Diagnosis not present

## 2023-10-21 DIAGNOSIS — E66813 Obesity, class 3: Secondary | ICD-10-CM | POA: Insufficient documentation

## 2023-10-21 DIAGNOSIS — Z01812 Encounter for preprocedural laboratory examination: Secondary | ICD-10-CM

## 2023-10-21 DIAGNOSIS — I10 Essential (primary) hypertension: Secondary | ICD-10-CM | POA: Insufficient documentation

## 2023-10-21 DIAGNOSIS — K8050 Calculus of bile duct without cholangitis or cholecystitis without obstruction: Secondary | ICD-10-CM | POA: Diagnosis not present

## 2023-10-21 DIAGNOSIS — K8010 Calculus of gallbladder with chronic cholecystitis without obstruction: Secondary | ICD-10-CM | POA: Diagnosis present

## 2023-10-21 LAB — POCT PREGNANCY, URINE: Preg Test, Ur: NEGATIVE

## 2023-10-21 SURGERY — CHOLECYSTECTOMY, ROBOT-ASSISTED, LAPAROSCOPIC
Anesthesia: General

## 2023-10-21 MED ORDER — SUGAMMADEX SODIUM 200 MG/2ML IV SOLN
INTRAVENOUS | Status: DC | PRN
  Administered 2023-10-21: 100 mg via INTRAVENOUS
  Administered 2023-10-21: 300 mg via INTRAVENOUS

## 2023-10-21 MED ORDER — PHENYLEPHRINE HCL-NACL 20-0.9 MG/250ML-% IV SOLN
INTRAVENOUS | Status: AC
  Filled 2023-10-21: qty 250

## 2023-10-21 MED ORDER — SODIUM CHLORIDE 0.9 % IV SOLN
INTRAVENOUS | Status: AC
  Filled 2023-10-21: qty 2

## 2023-10-21 MED ORDER — LIDOCAINE HCL (PF) 2 % IJ SOLN
INTRAMUSCULAR | Status: AC
  Filled 2023-10-21: qty 5

## 2023-10-21 MED ORDER — ROCURONIUM BROMIDE 100 MG/10ML IV SOLN
INTRAVENOUS | Status: DC | PRN
  Administered 2023-10-21: 10 mg via INTRAVENOUS
  Administered 2023-10-21: 50 mg via INTRAVENOUS

## 2023-10-21 MED ORDER — ACETAMINOPHEN 10 MG/ML IV SOLN
INTRAVENOUS | Status: DC | PRN
  Administered 2023-10-21: 1000 mg via INTRAVENOUS

## 2023-10-21 MED ORDER — LIDOCAINE HCL (CARDIAC) PF 100 MG/5ML IV SOSY
PREFILLED_SYRINGE | INTRAVENOUS | Status: DC | PRN
  Administered 2023-10-21: 100 mg via INTRAVENOUS

## 2023-10-21 MED ORDER — DEXAMETHASONE SODIUM PHOSPHATE 10 MG/ML IJ SOLN
INTRAMUSCULAR | Status: AC
  Filled 2023-10-21: qty 1

## 2023-10-21 MED ORDER — BUPIVACAINE LIPOSOME 1.3 % IJ SUSP
INTRAMUSCULAR | Status: AC
  Filled 2023-10-21: qty 10

## 2023-10-21 MED ORDER — DEXAMETHASONE SODIUM PHOSPHATE 10 MG/ML IJ SOLN
INTRAMUSCULAR | Status: DC | PRN
  Administered 2023-10-21: 4 mg via INTRAVENOUS

## 2023-10-21 MED ORDER — CHLORHEXIDINE GLUCONATE 0.12 % MT SOLN
OROMUCOSAL | Status: AC
  Filled 2023-10-21: qty 15

## 2023-10-21 MED ORDER — PHENYLEPHRINE HCL-NACL 20-0.9 MG/250ML-% IV SOLN
INTRAVENOUS | Status: DC | PRN
  Administered 2023-10-21: 50 ug/min via INTRAVENOUS

## 2023-10-21 MED ORDER — OXYCODONE HCL 5 MG PO TABS
ORAL_TABLET | ORAL | Status: AC
  Filled 2023-10-21: qty 1

## 2023-10-21 MED ORDER — KETOROLAC TROMETHAMINE 30 MG/ML IJ SOLN
INTRAMUSCULAR | Status: DC | PRN
  Administered 2023-10-21: 30 mg via INTRAVENOUS

## 2023-10-21 MED ORDER — PROPOFOL 10 MG/ML IV BOLUS
INTRAVENOUS | Status: DC | PRN
  Administered 2023-10-21: 190 mg via INTRAVENOUS

## 2023-10-21 MED ORDER — FENTANYL CITRATE (PF) 100 MCG/2ML IJ SOLN
INTRAMUSCULAR | Status: AC
  Filled 2023-10-21: qty 2

## 2023-10-21 MED ORDER — FENTANYL CITRATE (PF) 100 MCG/2ML IJ SOLN
25.0000 ug | INTRAMUSCULAR | Status: DC | PRN
  Administered 2023-10-21 (×4): 25 ug via INTRAVENOUS

## 2023-10-21 MED ORDER — OXYCODONE HCL 5 MG/5ML PO SOLN: 5.0000 mg | Freq: Once | ORAL | Status: AC | PRN

## 2023-10-21 MED ORDER — OXYCODONE HCL 5 MG PO TABS
5.0000 mg | ORAL_TABLET | Freq: Once | ORAL | Status: AC | PRN
  Administered 2023-10-21: 5 mg via ORAL

## 2023-10-21 MED ORDER — MIDAZOLAM HCL 2 MG/2ML IJ SOLN
INTRAMUSCULAR | Status: AC
  Filled 2023-10-21: qty 2

## 2023-10-21 MED ORDER — PHENYLEPHRINE 80 MCG/ML (10ML) SYRINGE FOR IV PUSH (FOR BLOOD PRESSURE SUPPORT)
PREFILLED_SYRINGE | INTRAVENOUS | Status: DC | PRN
  Administered 2023-10-21: 80 ug via INTRAVENOUS
  Administered 2023-10-21: 160 ug via INTRAVENOUS
  Administered 2023-10-21 (×2): 80 ug via INTRAVENOUS
  Administered 2023-10-21: 240 ug via INTRAVENOUS

## 2023-10-21 MED ORDER — BUPIVACAINE-EPINEPHRINE 0.5% -1:200000 IJ SOLN
INTRAMUSCULAR | Status: DC | PRN
  Administered 2023-10-21: 20 mL

## 2023-10-21 MED ORDER — VASOPRESSIN 20 UNIT/ML IV SOLN
INTRAVENOUS | Status: DC | PRN
  Administered 2023-10-21 (×2): 1 [IU] via INTRAVENOUS

## 2023-10-21 MED ORDER — ONDANSETRON HCL 4 MG/2ML IJ SOLN
INTRAMUSCULAR | Status: DC | PRN
  Administered 2023-10-21: 4 mg via INTRAVENOUS

## 2023-10-21 MED ORDER — ACETAMINOPHEN 10 MG/ML IV SOLN: 1000.0000 mg | Freq: Once | INTRAVENOUS | Status: DC | PRN

## 2023-10-21 MED ORDER — PHENYLEPHRINE 80 MCG/ML (10ML) SYRINGE FOR IV PUSH (FOR BLOOD PRESSURE SUPPORT)
PREFILLED_SYRINGE | INTRAVENOUS | Status: AC
  Filled 2023-10-21: qty 10

## 2023-10-21 MED ORDER — INDOCYANINE GREEN 25 MG IV SOLR
INTRAVENOUS | Status: AC
  Filled 2023-10-21: qty 10

## 2023-10-21 MED ORDER — KETOROLAC TROMETHAMINE 30 MG/ML IJ SOLN
INTRAMUSCULAR | Status: AC
  Filled 2023-10-21: qty 1

## 2023-10-21 MED ORDER — ONDANSETRON HCL 4 MG/2ML IJ SOLN
INTRAMUSCULAR | Status: AC
  Filled 2023-10-21: qty 2

## 2023-10-21 MED ORDER — ONDANSETRON HCL 4 MG/2ML IJ SOLN: 4.0000 mg | Freq: Once | INTRAMUSCULAR | Status: DC | PRN

## 2023-10-21 MED ORDER — ROCURONIUM BROMIDE 10 MG/ML (PF) SYRINGE
PREFILLED_SYRINGE | INTRAVENOUS | Status: AC
  Filled 2023-10-21: qty 10

## 2023-10-21 MED ORDER — OXYCODONE HCL 5 MG PO TABS: 5.0000 mg | ORAL_TABLET | Freq: Four times a day (QID) | ORAL | 0 refills | Status: DC | PRN

## 2023-10-21 MED ORDER — ACETAMINOPHEN 10 MG/ML IV SOLN
INTRAVENOUS | Status: AC
  Filled 2023-10-21: qty 100

## 2023-10-21 MED ORDER — PROPOFOL 10 MG/ML IV BOLUS
INTRAVENOUS | Status: AC
  Filled 2023-10-21: qty 40

## 2023-10-21 MED ORDER — BUPIVACAINE-EPINEPHRINE (PF) 0.5% -1:200000 IJ SOLN
INTRAMUSCULAR | Status: AC
  Filled 2023-10-21: qty 10

## 2023-10-21 MED ORDER — OXYCODONE HCL 5 MG PO TABS: 5.0000 mg | ORAL_TABLET | Freq: Three times a day (TID) | ORAL | 0 refills | Status: DC | PRN

## 2023-10-21 MED ORDER — MIDAZOLAM HCL 2 MG/2ML IJ SOLN
INTRAMUSCULAR | Status: DC | PRN
  Administered 2023-10-21: 2 mg via INTRAVENOUS

## 2023-10-21 MED ORDER — FENTANYL CITRATE (PF) 100 MCG/2ML IJ SOLN
INTRAMUSCULAR | Status: DC | PRN
  Administered 2023-10-21: 50 ug via INTRAVENOUS
  Administered 2023-10-21: 100 ug via INTRAVENOUS
  Administered 2023-10-21: 50 ug via INTRAVENOUS

## 2023-10-21 MED ORDER — VASOPRESSIN 20 UNIT/ML IV SOLN
INTRAVENOUS | Status: AC
  Filled 2023-10-21: qty 1

## 2023-10-21 SURGICAL SUPPLY — 44 items
BAG PRESSURE INF REUSE 1000 (BAG) IMPLANT
CANNULA REDUCER 12-8 DVNC XI (CANNULA) ×2 IMPLANT
CAUTERY HOOK MNPLR 1.6 DVNC XI (INSTRUMENTS) ×2 IMPLANT
CLIP LIGATING HEMO O LOK GREEN (MISCELLANEOUS) ×2 IMPLANT
DERMABOND ADVANCED .7 DNX12 (GAUZE/BANDAGES/DRESSINGS) ×2 IMPLANT
DRAPE ARM DVNC X/XI (DISPOSABLE) ×8 IMPLANT
DRAPE COLUMN DVNC XI (DISPOSABLE) ×2 IMPLANT
ELECT REM PT RETURN 9FT ADLT (ELECTROSURGICAL) ×1
ELECTRODE REM PT RTRN 9FT ADLT (ELECTROSURGICAL) ×2 IMPLANT
FORCEPS BPLR 8 MD DVNC XI (FORCEP) IMPLANT
FORCEPS BPLR R/ABLATION 8 DVNC (INSTRUMENTS) ×2 IMPLANT
FORCEPS PROGRASP DVNC XI (FORCEP) ×2 IMPLANT
GLOVE BIOGEL PI IND STRL 7.5 (GLOVE) ×4 IMPLANT
GLOVE SURG SYN 7.0 (GLOVE) ×2
GLOVE SURG SYN 7.0 PF PI (GLOVE) ×4 IMPLANT
GOWN STRL REUS W/ TWL LRG LVL3 (GOWN DISPOSABLE) ×6 IMPLANT
GRASPER SUT TROCAR 14GX15 (MISCELLANEOUS) ×2 IMPLANT
IRRIGATOR SUCT 8 DISP DVNC XI (IRRIGATION / IRRIGATOR) IMPLANT
IV NS 1000ML BAXH (IV SOLUTION) IMPLANT
KIT PINK PAD W/HEAD ARE REST (MISCELLANEOUS) ×1
KIT PINK PAD W/HEAD ARM REST (MISCELLANEOUS) ×2 IMPLANT
LABEL OR SOLS (LABEL) ×2 IMPLANT
MANIFOLD NEPTUNE II (INSTRUMENTS) ×2 IMPLANT
NDL HYPO 22X1.5 SAFETY MO (MISCELLANEOUS) ×2 IMPLANT
NDL INSUFFLATION 14GA 120MM (NEEDLE) ×2 IMPLANT
NEEDLE HYPO 22X1.5 SAFETY MO (MISCELLANEOUS) ×1
NEEDLE INSUFFLATION 14GA 120MM (NEEDLE) ×1
NS IRRIG 500ML POUR BTL (IV SOLUTION) ×2 IMPLANT
OBTURATOR OPTICAL STND 8 DVNC (TROCAR) ×1
OBTURATOR OPTICALSTD 8 DVNC (TROCAR) ×2 IMPLANT
PACK LAP CHOLECYSTECTOMY (MISCELLANEOUS) ×2 IMPLANT
SEAL UNIV 5-12 XI (MISCELLANEOUS) ×8 IMPLANT
SET TUBE SMOKE EVAC HIGH FLOW (TUBING) ×2 IMPLANT
SOL ELECTROSURG ANTI STICK (MISCELLANEOUS) ×1
SOLUTION ELECTROSURG ANTI STCK (MISCELLANEOUS) ×2 IMPLANT
SPIKE FLUID TRANSFER (MISCELLANEOUS) ×4 IMPLANT
SPONGE T-LAP 4X18 ~~LOC~~+RFID (SPONGE) IMPLANT
SUT MNCRL 4-0 27 PS-2 XMFL (SUTURE) ×1
SUT MNCRL 4-0 27XMFL (SUTURE) ×1
SUT VICRYL 0 UR6 27IN ABS (SUTURE) ×2 IMPLANT
SUTURE MNCRL 4-0 27XMF (SUTURE) ×2 IMPLANT
SYS BAG RETRIEVAL 10MM (BASKET) ×1
SYSTEM BAG RETRIEVAL 10MM (BASKET) ×2 IMPLANT
WATER STERILE IRR 500ML POUR (IV SOLUTION) ×2 IMPLANT

## 2023-10-21 NOTE — Transfer of Care (Signed)
Immediate Anesthesia Transfer of Care Note  Patient: Laura Gutierrez  Procedure(s) Performed: XI ROBOTIC ASSISTED LAPAROSCOPIC CHOLECYSTECTOMY INDOCYANINE GREEN FLUORESCENCE IMAGING (ICG)  Patient Location: PACU  Anesthesia Type:General  Level of Consciousness: awake, alert , and oriented  Airway & Oxygen Therapy: Patient Spontanous Breathing and Patient connected to face mask oxygen  Post-op Assessment: Report given to RN, Post -op Vital signs reviewed and stable, and Patient moving all extremities X 4  Post vital signs: Reviewed and stable  Last Vitals:  Vitals Value Taken Time  BP 128/75 10/21/23 0927  Temp    Pulse 75 10/21/23 0929  Resp 13 10/21/23 0929  SpO2 100 % 10/21/23 0929  Vitals shown include unfiled device data.  Last Pain:  Vitals:   10/21/23 0633  TempSrc: Temporal  PainSc: 0-No pain         Complications: No notable events documented.

## 2023-10-21 NOTE — Anesthesia Procedure Notes (Signed)
Procedure Name: Intubation Date/Time: 10/21/2023 7:40 AM  Performed by: Nelle Don, CRNAPre-anesthesia Checklist: Patient identified, Emergency Drugs available, Suction available and Patient being monitored Patient Re-evaluated:Patient Re-evaluated prior to induction Oxygen Delivery Method: Circle system utilized Preoxygenation: Pre-oxygenation with 100% oxygen Induction Type: IV induction Ventilation: Oral airway inserted - appropriate to patient size and Two handed mask ventilation required Laryngoscope Size: McGrath and 4 Grade View: Grade I Tube type: Oral Tube size: 6.5 mm Number of attempts: 1 Airway Equipment and Method: Stylet and Video-laryngoscopy Placement Confirmation: ETT inserted through vocal cords under direct vision, positive ETCO2 and breath sounds checked- equal and bilateral Secured at: 23 cm Tube secured with: Tape Dental Injury: Teeth and Oropharynx as per pre-operative assessment  Comments: Elective mcgrath

## 2023-10-21 NOTE — H&P (Signed)
No changes to below H and P, proceed with surgery as planned.   Patient ID: Laura Gutierrez, female   DOB: 1986/08/10, 38 y.o.   MRN: [id number] CC: RUQ Pain, Diarrhea  History of Present Illness Laura Gutierrez is a 38 y.o. female with past medical history significant for morbid obesity who presents consultation for right upper quadrant pain and diarrhea.  The patient reports that over the last several months she has had postprandial diarrhea as well as right upper quadrant pain.  This is also associated with some radiation to her epigastrium.  She describes the pain as sharp.  She says that after Thanksgiving the pain persisted so she went to the emergency department for evaluation.  There she was found cholelithiasis on her ultrasound.  She says that her pain resolved but over the last several weeks she has intermittently gotten the same diarrhea and right upper quadrant pain.  This is worse with spicy foods and foods with fat.  She denies any vomiting or constipation..   Past Medical History     Past Medical History:  Diagnosis Date   Bronchitis     Gestational diabetes     Gonorrhea     Hypertension     Morbid obesity (HCC)     Obesity     Syphilis     UTI (urinary tract infection)                   Past Surgical History:  Procedure Laterality Date   PERINEAL LACERATION REPAIR N/A 07/13/2020    Procedure: SUTURE REPAIR PERINEAL LACERATION;  Surgeon: Tereso Newcomer, MD;  Location: [facility] LD [facility];  Service: Gynecology;  Laterality: N/A;   TONSILLECTOMY              Allergies       Allergies  Allergen Reactions   Bactrim [Sulfamethoxazole-Trimethoprim] Anaphylaxis   Latex        itching              Current Outpatient Medications  Medication Sig Dispense Refill   amLODipine (NORVASC) 10 MG tablet Take 1 tablet (10 mg total) by mouth daily. 90 tablet 1   carvedilol (COREG) 12.5 MG tablet Take 1 tablet (12.5 mg total) by mouth 2 (two) times daily with a meal. 180 tablet 1    ondansetron (ZOFRAN-ODT) 8 MG disintegrating tablet Take 1 tablet (8 mg total) by mouth every 8 (eight) hours as needed for nausea. 20 tablet 0   oxyCODONE-acetaminophen (PERCOCET/ROXICET) 5-325 MG tablet Take 1 tablet by mouth every 8 (eight) hours as needed for severe pain (pain score 7-10). 6 tablet 0      No current facility-administered medications for this visit.        Family History      Family History  Problem Relation Age of Onset   Hypertension Mother     Diabetes Mother     Healthy Father     Alzheimer's disease Maternal Aunt     Heart disease Maternal Grandfather     Diabetes Paternal Grandmother              Social History Social History  Social History         Tobacco Use   Smoking status: Every Day      Current packs/day: 1.00      Average packs/day: 1 pack/day for 19.0 years (19.0 ttl pk-yrs)      Types: Cigarettes      Start date: 2006  Smokeless tobacco: Never   Tobacco comments:      Cutting back 06/06/23  Vaping Use   Vaping status: Never Used  Substance Use Topics   Alcohol use: Not Currently      Alcohol/week: 1.0 standard drink of alcohol      Types: 1 Glasses of wine per week      Comment: sometimes     Drug use: No            ROS Full ROS of systems performed and is otherwise negative there than what is stated in the HPI   Physical Exam Blood pressure (!) 156/105, pulse 88, temperature 98.4 F (36.9 C), temperature source Oral, height 5\' 6"  (1.676 m), weight (!) 322 lb 9.6 oz (146.3 kg), SpO2 100%.   Alert and oriented x 3, PERRLA, clear to auscultation bilaterally, regular rate and rhythm, moving all extremities spontaneously, abdomen is soft, obese, mild tenderness in epigastrium, negative Murphy sign, skin is warm and dry   Data Reviewed I reviewed her ultrasound it is significant for cholelithiasis without gallbladder wall thickening or pericholecystic fluid.  Her labs are notable for a normal white count and a T bilirubin that  is within normal limits.   I have personally reviewed the patient's imaging and medical records.     Assessment/Plan Assessment [name] is a 39 year old who presents with biliary colic.  I discussed with her that I recommend surgical intervention with a robotic assisted cholecystectomy.  I discussed the risk, benefits alternatives of the procedure including risk of infection, bleeding, bile leak, retained stone, injury to common bile duct, need for open procedure.  I also discussed that there is risk of hernia given her obesity.  I also discussed that given her obesity she is at increased risk for all complications.  She understands these risks and wishes to proceed.  We will use ICG

## 2023-10-21 NOTE — Anesthesia Preprocedure Evaluation (Signed)
Anesthesia Evaluation  Patient identified by MRN, date of birth, ID band Patient awake    Reviewed: Allergy & Precautions, NPO status , Patient's Chart, lab work & pertinent test results  History of Anesthesia Complications Negative for: history of anesthetic complications  Airway Mallampati: III  TM Distance: >3 FB Neck ROM: Full    Dental no notable dental hx. (+) Teeth Intact   Pulmonary neg sleep apnea, neg COPD, Current SmokerPatient did not abstain from smoking.   Pulmonary exam normal breath sounds clear to auscultation       Cardiovascular Exercise Tolerance: Good METShypertension, Pt. on medications (-) CAD and (-) Past MI (-) dysrhythmias  Rhythm:Regular Rate:Normal - Systolic murmurs    Neuro/Psych negative neurological ROS  negative psych ROS   GI/Hepatic ,neg GERD  ,,(+)     (-) substance abuse    Endo/Other  neg diabetes  Class 4 obesity  Renal/GU negative Renal ROS     Musculoskeletal   Abdominal  (+) + obese  Peds  Hematology   Anesthesia Other Findings Past Medical History: No date: Bronchitis No date: Essential hypertension No date: Gestational diabetes No date: Gonorrhea No date: Morbid obesity with BMI of 50.0-59.9, adult (HCC) 10/2023: Symptomatic cholelithiasis No date: Syphilis No date: UTI (urinary tract infection)  Reproductive/Obstetrics                             Anesthesia Physical Anesthesia Plan  ASA: 3  Anesthesia Plan: General   Post-op Pain Management: Ofirmev IV (intra-op)* and Toradol IV (intra-op)*   Induction: Intravenous  PONV Risk Score and Plan: 3 and Ondansetron, Dexamethasone, Midazolam and Treatment may vary due to age or medical condition  Airway Management Planned: Oral ETT and Video Laryngoscope Planned  Additional Equipment: None  Intra-op Plan:   Post-operative Plan: Extubation in OR  Informed Consent: I have reviewed  the patients History and Physical, chart, labs and discussed the procedure including the risks, benefits and alternatives for the proposed anesthesia with the patient or authorized representative who has indicated his/her understanding and acceptance.     Dental advisory given  Plan Discussed with: CRNA and Surgeon  Anesthesia Plan Comments: (Discussed risks of anesthesia with patient, including PONV, sore throat, lip/dental/eye damage. Rare risks discussed as well, such as cardiorespiratory and neurological sequelae, and allergic reactions. Discussed the role of CRNA in patient's perioperative care. Patient understands. Patient informed about increased incidence of above perioperative risk due to high BMI. Patient understands.  Patient counseled on benefits of smoking cessation, and increased perioperative risks associated with continued smoking. )       Anesthesia Quick Evaluation

## 2023-10-21 NOTE — Anesthesia Postprocedure Evaluation (Signed)
Anesthesia Post Note  Patient: [name]  Procedure(s) Performed: XI ROBOTIC ASSISTED LAPAROSCOPIC CHOLECYSTECTOMY INDOCYANINE GREEN FLUORESCENCE IMAGING (ICG)  Patient location during evaluation: PACU Anesthesia Type: General Level of consciousness: awake and alert Pain management: pain level controlled Vital Signs Assessment: post-procedure vital signs reviewed and stable Respiratory status: spontaneous breathing, nonlabored ventilation, respiratory function stable and patient connected to nasal cannula oxygen Cardiovascular status: blood pressure returned to baseline and stable Postop Assessment: no apparent nausea or vomiting Anesthetic complications: no   No notable events documented.   Last Vitals:  Vitals:   10/21/23 1100 10/21/23 1115  BP: 129/80 (!) 143/86  Pulse: 71 72  Resp: 20 15  Temp: (!) 36.3 C (!) 36.4 C  SpO2: 94% 97%    Last Pain:  Vitals:   10/21/23 1115  TempSrc: Temporal  PainSc: 5                  Corinda Gubler

## 2023-10-21 NOTE — Op Note (Signed)
Robotic assisted laparoscopic Cholecystectomy  Pre-operative Diagnosis: Biliary Colic  Post-operative Diagnosis: Same  Procedure:  Robotic assisted laparoscopic Cholecystectomy  Surgeon: Baker Pierini, MD  Anesthesia: Gen. with endotracheal tube  Findings: Omental adhesions to gallbladder consistent with previous biliary colic attacks, critical view of safety obtained  Estimated Blood Loss: 10cc       Specimens: Gallbladder           Complications: none   Procedure Details  The patient was seen again in the Holding Room. The benefits, complications, treatment options, and expected outcomes were discussed with the patient. The risks of bleeding, infection, recurrence of symptoms, failure to resolve symptoms, bile duct damage, bile duct leak, retained common bile duct stone, bowel injury, any of which could require further surgery and/or ERCP, stent, or papillotomy were reviewed with the patient. The likelihood of improving the patient's symptoms with return to their baseline status is good.  The patient and/or family concurred with the proposed plan, giving informed consent.  The patient was taken to Operating Room, identified  and the procedure verified as robotic Cholecystectomy.  A Time Out was held and the above information confirmed.  Prior to the induction of general anesthesia, antibiotic prophylaxis was administered. VTE prophylaxis was in place. General endotracheal anesthesia was then administered and tolerated well. After the induction, the abdomen was prepped with Chloraprep and draped in the sterile fashion. The patient was positioned in the supine position.  A veress needle was inserted into the abdomen using standard drop technique. An 8mm infra-umbilical robotic port was then placed under direct visualization. There was no injury noted at the site of veress needle insertion. Two right sided abdominal 8mm ports followed by an 8mm left abdominal robotic ports were placed  under direct visualization. The left sided abdominal port was then upsized to a 12mm robotic port.  The patient was positioned  in reverse Trendelenburg, robot was brought to the surgical field and docked in the standard fashion.  We made sure all the instrumentation was kept indirect view at all times and that there were no collision between the arms. I scrubbed out and went to the console.  The gallbladder was identified, the fundus grasped and retracted cephalad. Adhesions were lysed bluntly. The infundibulum was grasped and retracted laterally, exposing the peritoneum overlying the triangle of Calot. This was then divided and exposed in a blunt fashion. An extended critical view of the cystic duct and cystic artery was obtained.  The cystic duct was clearly identified and bluntly dissected.   Artery and duct were double clipped and divided. Using ICG cholangiography we visualized the cystic duct. The gallbladder was taken from the gallbladder fossa in a retrograde fashion with the electrocautery.  Hemostasis was achieved with the electrocautery. nspection of the right upper quadrant was performed. No bleeding, bile duct injury or leak, or bowel injury was noted. Robotic instruments and robotic arms were undocked in the standard fashion.  I scrubbed back in.  The gallbladder was removed and placed in an Endocatch bag.   The left lower quadrant fascia was then closed with a 0 vicryl using a suture needle passer. The pre-peritoneal space was then infiltrated with liposomal bupivicaine and marcaine solution. Pneumoperitoneum was released.  4-0 subcuticular Monocryl was used to close the skin. Dermabond was  applied.  The patient was then extubated and brought to the recovery room in stable condition. Sponge, lap, and needle counts were correct at closure and at the conclusion of the case.  Baker Pierini, M.D. [facility]

## 2023-10-24 LAB — SURGICAL PATHOLOGY

## 2023-11-02 ENCOUNTER — Encounter: Payer: Self-pay | Admitting: Physician Assistant

## 2023-11-02 ENCOUNTER — Ambulatory Visit (INDEPENDENT_AMBULATORY_CARE_PROVIDER_SITE_OTHER): Payer: Managed Care, Other (non HMO) | Admitting: Physician Assistant

## 2023-11-02 VITALS — BP 138/86 | HR 92 | Temp 98.0°F | Ht 66.0 in | Wt 327.0 lb

## 2023-11-02 DIAGNOSIS — Z09 Encounter for follow-up examination after completed treatment for conditions other than malignant neoplasm: Secondary | ICD-10-CM

## 2023-11-02 DIAGNOSIS — K8050 Calculus of bile duct without cholangitis or cholecystitis without obstruction: Secondary | ICD-10-CM

## 2023-11-02 NOTE — Progress Notes (Signed)
[facility] POST-OP OFFICE VISIT  11/02/2023  HPI: Laura Gutierrez is a 38 y.o. female 12 days s/p robotic assisted laparoscopic cholecystectomy for biliary colic with Dr Maurine Minister  She has done well Still with mild incisional soreness; worse on the left No fever, chills, nausea, emesis She is having intermittent diarrhea; worse with fatty food Incisions are healing well Ambulating without issue No other complaints   Vital signs: BP 138/86   Pulse 92   Temp 98 F (36.7 C)   Ht 5\' 6"  (1.676 m)   Wt (!) 327 lb (148.3 kg)   LMP 10/07/2023 (Exact Date)   SpO2 99%   BMI 52.78 kg/m    Physical Exam: Constitutional: Well appearing female, NAD Abdomen: Soft, non-tender, non-distended, no rebound/guarding Skin: Laparoscopic incisions are healing well, no erythema or drainage   Assessment/Plan: This is a 38 y.o. female 12 days s/p robotic assisted laparoscopic cholecystectomy for biliary colic with Dr Maurine Minister   - Reviewed etiology of diarrhea after cholecystectomy; Recommend limiting/avoiding faty foods. This is typically self-limiting and should resolve with time   - Pain control prn  - Reviewed wound care recommendation  - Reviewed lifting restrictions; 4 weeks total  - Reviewed surgical pathology; CCC  - She can follow up on as needed basis; She understands to call with questions/concerns  -- Lynden Oxford, PA-C [facility] 11/02/2023, 2:17 PM M-F: 7am - 4pm

## 2023-11-02 NOTE — Patient Instructions (Signed)

## 2023-11-15 ENCOUNTER — Telehealth: Payer: Self-pay | Admitting: *Deleted

## 2023-11-15 NOTE — Telephone Encounter (Signed)
Patient had surgery on 10/21/23 Dr Maurine Minister gallbladder. She wants a return to work note, returning on the 14th. Call her when its ready and she will let us know then if we need to fax it or pick it up.

## 2024-01-16 ENCOUNTER — Other Ambulatory Visit (HOSPITAL_COMMUNITY)
Admission: RE | Admit: 2024-01-16 | Discharge: 2024-01-16 | Disposition: A | Discharge status: Home / Self Care | Source: Ambulatory Visit | Attending: Primary Care | Admitting: Primary Care

## 2024-01-16 ENCOUNTER — Ambulatory Visit (INDEPENDENT_AMBULATORY_CARE_PROVIDER_SITE_OTHER): Admitting: Primary Care

## 2024-01-16 ENCOUNTER — Encounter (INDEPENDENT_AMBULATORY_CARE_PROVIDER_SITE_OTHER): Payer: Self-pay | Admitting: Primary Care

## 2024-01-16 VITALS — BP 140/88 | HR 89 | Resp 16 | Ht 64.5 in | Wt 319.8 lb

## 2024-01-16 DIAGNOSIS — Z6841 Body Mass Index (BMI) 40.0 and over, adult: Secondary | ICD-10-CM

## 2024-01-16 DIAGNOSIS — I10 Essential (primary) hypertension: Secondary | ICD-10-CM

## 2024-01-16 DIAGNOSIS — E66813 Obesity, class 3: Secondary | ICD-10-CM

## 2024-01-16 DIAGNOSIS — Z113 Encounter for screening for infections with a predominantly sexual mode of transmission: Secondary | ICD-10-CM | POA: Insufficient documentation

## 2024-01-16 DIAGNOSIS — R300 Dysuria: Secondary | ICD-10-CM | POA: Diagnosis not present

## 2024-01-16 DIAGNOSIS — R7303 Prediabetes: Secondary | ICD-10-CM

## 2024-01-16 DIAGNOSIS — Z0000 Encounter for general adult medical examination without abnormal findings: Secondary | ICD-10-CM

## 2024-01-16 DIAGNOSIS — Z2821 Immunization not carried out because of patient refusal: Secondary | ICD-10-CM

## 2024-01-16 DIAGNOSIS — D509 Iron deficiency anemia, unspecified: Secondary | ICD-10-CM

## 2024-01-16 DIAGNOSIS — Z760 Encounter for issue of repeat prescription: Secondary | ICD-10-CM

## 2024-01-16 LAB — POCT URINALYSIS DIP (CLINITEK)
Bilirubin, UA: NEGATIVE
Blood, UA: NEGATIVE
Glucose, UA: NEGATIVE mg/dL
Ketones, POC UA: NEGATIVE mg/dL
Leukocytes, UA: NEGATIVE
Nitrite, UA: NEGATIVE
POC PROTEIN,UA: NEGATIVE
Spec Grav, UA: 1.02 (ref 1.010–1.025)
Urobilinogen, UA: 0.2 U/dL
pH, UA: 7 (ref 5.0–8.0)

## 2024-01-16 MED ORDER — CARVEDILOL 12.5 MG PO TABS: 12.5000 mg | ORAL_TABLET | Freq: Two times a day (BID) | ORAL | 0 refills | Status: AC

## 2024-01-16 MED ORDER — AMLODIPINE BESYLATE 10 MG PO TABS: 10.0000 mg | ORAL_TABLET | Freq: Every day | ORAL | 0 refills | Status: AC

## 2024-01-16 MED ORDER — HYDRALAZINE HCL 50 MG PO TABS
50.0000 mg | ORAL_TABLET | Freq: Once | ORAL | Status: AC
Start: 2024-01-16 — End: ?

## 2024-01-16 NOTE — Progress Notes (Signed)
 [facility]  Laura Gutierrez is a 38 y.o. female presents to office today for annual physical exam examination.    Concerns today include: 1. Dysuria , STD screening  Occupation: distribution center , Marital status: S, Substance use: S , Diet: monitors sodium and carb, Exercise: walks  Health Maintenance  Topic Date Due   COVID-19 Vaccine (1 - 2024-25 season) Never done   Pneumococcal Vaccine 19-7 Years old (1 of 2 - PCV) 01/15/2025 (Originally 11/07/2006)   INFLUENZA VACCINE  05/04/2024   Cervical Cancer Screening (HPV/Pap Cotest)  01/19/2028   DTaP/Tdap/Td (3 - Td or Tdap) 05/20/2030   Hepatitis C Screening  Completed   HIV Screening  Completed   HPV VACCINES  Aged Out   Meningococcal B Vaccine  Aged Out     Past Medical History:  Diagnosis Date   Bronchitis    Essential hypertension    Gestational diabetes    Gonorrhea    Morbid obesity with BMI of 50.0-59.9, adult (HCC)    Symptomatic cholelithiasis 10/2023   Syphilis    UTI (urinary tract infection)    Social History   Socioeconomic History   Marital status: Single    Spouse name: Not on file   Number of children: 1   Years of education: Not on file   Highest education level: Not on file  Occupational History   Not on file  Tobacco Use   Smoking status: Every Day    Current packs/day: 1.00    Average packs/day: 1 pack/day for 19.3 years (19.3 ttl pk-yrs)    Types: Cigarettes    Start date: 2006    Passive exposure: Past   Smokeless tobacco: Never   Tobacco comments:    Cutting back 06/06/23  Vaping Use   Vaping status: Never Used  Substance and Sexual Activity   Alcohol use: Yes    Alcohol/week: 1.0 standard drink of alcohol    Types: 1 Glasses of wine per week    Comment: sometimes     Drug use: No   Sexual activity: Not Currently    Birth control/protection: None  Other Topics Concern   Not on file  Social History Narrative   Lives with mother and son   Social Drivers of Manufacturing engineer Strain: Not on file  Food Insecurity: Not on file  Transportation Needs: Not on file  Physical Activity: Not on file  Stress: Not on file  Social Connections: Not on file  Intimate Partner Violence: Not on file   Past Surgical History:  Procedure Laterality Date   PERINEAL LACERATION REPAIR N/A 07/13/2020   Procedure: SUTURE REPAIR PERINEAL LACERATION;  Surgeon: Julianne Octave, MD;  Location: [facility] LD ORS;  Service: Gynecology;  Laterality: N/A;   TONSILLECTOMY     Family History  Problem Relation Age of Onset   Hypertension Mother    Diabetes Mother    Healthy Father    Alzheimer's disease Maternal Aunt    Heart disease Maternal Grandfather    Diabetes Paternal Grandmother     Current Outpatient Medications:    amLODipine (NORVASC) 10 MG tablet, Take 1 tablet (10 mg total) by mouth daily., Disp: 90 tablet, Rfl: 1   carvedilol (COREG) 12.5 MG tablet, Take 1 tablet (12.5 mg total) by mouth 2 (two) times daily with a meal., Disp: 180 tablet, Rfl: 1   oxyCODONE (ROXICODONE) 5 MG immediate release tablet, Take 1 tablet (5 mg total) by mouth every 8 (eight) hours as  needed., Disp: 12 tablet, Rfl: 0 Outpatient Encounter Medications as of 01/16/2024  Medication Sig   amLODipine (NORVASC) 10 MG tablet Take 1 tablet (10 mg total) by mouth daily.   carvedilol (COREG) 12.5 MG tablet Take 1 tablet (12.5 mg total) by mouth 2 (two) times daily with a meal.   oxyCODONE (ROXICODONE) 5 MG immediate release tablet Take 1 tablet (5 mg total) by mouth every 8 (eight) hours as needed.   No facility-administered encounter medications on file as of 01/16/2024.    Allergies  Allergen Reactions   Bactrim [Sulfamethoxazole-Trimethoprim] Anaphylaxis   Latex Itching     ROS: Review of Systems Pertinent items noted in HPI and remainder of comprehensive ROS otherwise negative.    Physical exam: General: Vital signs reviewed.  Patient is well-developed and well-nourished, morbid  obese in no acute distress and cooperative with exam. Head: Normocephalic and atraumatic. Eyes: EOMI, conjunctivae normal, no scleral icterus. Neck: Supple, trachea midline, normal ROM, no JVD, masses, thyromegaly, or carotid bruit present. Cardiovascular: RRR, S1 normal, S2 normal, no murmurs, gallops, or rubs. Pulmonary/Chest: Clear to auscultation bilaterally, no wheezes, rales, or rhonchi. Abdominal: Soft, non-tender, non-distended, BS +, no masses, organomegaly, or guarding present. Musculoskeletal: No joint deformities, erythema, or stiffness, ROM full and nontender. Extremities: No lower extremity edema bilaterally,  pulses symmetric and intact bilaterally. No cyanosis or clubbing. Neurological: A&O x3, Strength is normal Skin: Warm, dry and intact. No rashes or erythema. Psychiatric: Normal mood and affect. speech and behavior is normal. Cognition and memory are normal.      Assessment/ Plan: [name] here for annual physical exam.  Mayling was seen today for annual exam and hypertension.  Diagnoses and all orders for this visit:  Annual physical exam  Dysuria -     POCT URINALYSIS DIP (CLINITEK)  Screening for STD (sexually transmitted disease) -     Cervicovaginal ancillary only -     HIV Antibody (routine testing w rflx)  Pneumococcal vaccination declined  Iron deficiency anemia, unspecified iron deficiency anemia type -     CBC with Differential/Platelet  Prediabetes - educated on lifestyle modifications, including but not limited to diet choices and adding exercise to daily routine.   -     Hemoglobin A1c -     Lipid panel  Hypertension, unspecified type BP goal - < 130/80 Explained that having normal blood pressure is the goal and medications are helping to get to goal and maintain normal blood pressure. DIET: Limit salt intake, read nutrition labels to check salt content, limit fried and high fatty foods  Avoid using multisymptom OTC cold preparations  that generally contain sudafed which can rise BP. Consult with pharmacist on best cold relief products to use for persons with HTN EXERCISE Discussed incorporating exercise such as walking - 30 minutes most days of the week and can do in 10 minute intervals    -     CMP14+EGFR  Class 3 severe obesity due to excess calories with serious comorbidity and body mass index (BMI) of 50.0 to 59.9 in adult [facility]) Discussed diet and exercise for person with BMI >25. Instructed: You must burn more calories than you eat. Losing 5 percent of your body weight should be considered a success. In the longer term, losing more than 15 percent of your body weight and staying at this weight is an extremely good result. However, keep in mind that even losing 5 percent of your body weight leads to important health benefits, so try  not to get discouraged if you're not able to lose more than this. Will recheck weight in 3-6 months.  -     Lipid panel  Medication refill -     carvedilol (COREG) 12.5 MG tablet; Take 1 tablet (12.5 mg total) by mouth 2 (two) times daily with a meal. -     amLODipine (NORVASC) 10 MG tablet; Take 1 tablet (10 mg total) by mouth daily.    Counseled on healthy lifestyle choices, including diet (rich in fruits, vegetables and lean meats and low in salt and simple carbohydrates) and exercise (at least 30 minutes of moderate physical activity daily).  Patient to follow up in 1 year for annual exam or sooner if needed.  The above assessment and management plan was discussed with the patient. The patient verbalized understanding of and has agreed to the management plan. Patient is aware to call the clinic if symptoms persist or worsen. Patient is aware when to return to the clinic for a follow-up visit. Patient educated on when it is appropriate to go to the emergency department.   This note has been created with Education officer, environmental. Any transcriptional  errors are unintentional.   Marius Siemens, NP 01/16/2024, 8:47 AM

## 2024-01-17 LAB — LIPID PANEL
Chol/HDL Ratio: 3.5 ratio (ref 0.0–4.4)
Cholesterol, Total: 175 mg/dL (ref 100–199)
HDL: 50 mg/dL (ref >39)
LDL Chol Calc (NIH): 112 mg/dL — ABNORMAL HIGH (ref 0–99)
Triglycerides: 68 mg/dL (ref 0–149)
VLDL Cholesterol Cal: 13 mg/dL (ref 5–40)

## 2024-01-17 LAB — CBC WITH DIFFERENTIAL/PLATELET
Basophils Absolute: 0.1 10*3/uL (ref 0.0–0.2)
Basos: 1 %
EOS (ABSOLUTE): 0.1 10*3/uL (ref 0.0–0.4)
Eos: 1 %
Hematocrit: 35.9 % (ref 34.0–46.6)
Hemoglobin: 10.5 g/dL — ABNORMAL LOW (ref 11.1–15.9)
Immature Grans (Abs): 0 10*3/uL (ref 0.0–0.1)
Immature Granulocytes: 0 %
Lymphocytes Absolute: 2.7 10*3/uL (ref 0.7–3.1)
Lymphs: 37 %
MCH: 24.4 pg — ABNORMAL LOW (ref 26.6–33.0)
MCHC: 29.2 g/dL — ABNORMAL LOW (ref 31.5–35.7)
MCV: 84 fL (ref 79–97)
Monocytes Absolute: 0.7 10*3/uL (ref 0.1–0.9)
Monocytes: 9 %
Neutrophils Absolute: 3.8 10*3/uL (ref 1.4–7.0)
Neutrophils: 52 %
Platelets: 304 10*3/uL (ref 150–450)
RBC: 4.3 x10E6/uL (ref 3.77–5.28)
RDW: 15.7 % — ABNORMAL HIGH (ref 11.7–15.4)
WBC: 7.4 10*3/uL (ref 3.4–10.8)

## 2024-01-17 LAB — CMP14+EGFR
ALT: 9 IU/L (ref 0–32)
AST: 16 IU/L (ref 0–40)
Albumin: 4.2 g/dL (ref 3.9–4.9)
Alkaline Phosphatase: 114 IU/L (ref 44–121)
BUN/Creatinine Ratio: 14 (ref 9–23)
BUN: 12 mg/dL (ref 6–20)
Bilirubin Total: 0.3 mg/dL (ref 0.0–1.2)
CO2: 22 mmol/L (ref 20–29)
Calcium: 8.9 mg/dL (ref 8.7–10.2)
Chloride: 104 mmol/L (ref 96–106)
Creatinine, Ser: 0.87 mg/dL (ref 0.57–1.00)
Globulin, Total: 2.7 g/dL (ref 1.5–4.5)
Glucose: 83 mg/dL (ref 70–99)
Potassium: 4.9 mmol/L (ref 3.5–5.2)
Sodium: 138 mmol/L (ref 134–144)
Total Protein: 6.9 g/dL (ref 6.0–8.5)
eGFR: 88 mL/min/{1.73_m2} (ref >59)

## 2024-01-17 LAB — HIV ANTIBODY (ROUTINE TESTING W REFLEX): HIV Screen 4th Generation wRfx: NONREACTIVE

## 2024-01-17 LAB — HEMOGLOBIN A1C
Est. average glucose Bld gHb Est-mCnc: 126 mg/dL
Hgb A1c MFr Bld: 6 % — ABNORMAL HIGH (ref 4.8–5.6)

## 2024-01-18 ENCOUNTER — Encounter (INDEPENDENT_AMBULATORY_CARE_PROVIDER_SITE_OTHER): Payer: Self-pay | Admitting: Primary Care

## 2024-01-18 LAB — CERVICOVAGINAL ANCILLARY ONLY
Bacterial Vaginitis (gardnerella): POSITIVE — AB
Candida Glabrata: NEGATIVE
Candida Vaginitis: NEGATIVE
Chlamydia: NEGATIVE
Comment: NEGATIVE
Comment: NEGATIVE
Comment: NEGATIVE
Comment: NEGATIVE
Comment: NEGATIVE
Comment: NORMAL
Neisseria Gonorrhea: NEGATIVE
Trichomonas: NEGATIVE

## 2024-01-18 NOTE — Telephone Encounter (Signed)
 Will forward to provider

## 2024-01-19 ENCOUNTER — Encounter (INDEPENDENT_AMBULATORY_CARE_PROVIDER_SITE_OTHER): Payer: Self-pay | Admitting: Primary Care

## 2024-01-19 ENCOUNTER — Other Ambulatory Visit (INDEPENDENT_AMBULATORY_CARE_PROVIDER_SITE_OTHER): Payer: Self-pay | Admitting: Primary Care

## 2024-01-19 DIAGNOSIS — B9689 Other specified bacterial agents as the cause of diseases classified elsewhere: Secondary | ICD-10-CM

## 2024-01-19 MED ORDER — METRONIDAZOLE 500 MG PO TABS
500.0000 mg | ORAL_TABLET | Freq: Two times a day (BID) | ORAL | 0 refills | Status: AC
Start: 2024-01-19 — End: ?

## 2024-01-19 NOTE — Telephone Encounter (Signed)
 Pt called in today to make sure she got her medicine for this before the holiday and weekend.Please advise.

## 2024-01-19 NOTE — Telephone Encounter (Signed)
 Provider has resulted labs through [employer]

## 2024-01-23 ENCOUNTER — Telehealth (INDEPENDENT_AMBULATORY_CARE_PROVIDER_SITE_OTHER): Payer: Self-pay | Admitting: Primary Care

## 2024-01-23 NOTE — Telephone Encounter (Signed)
 Left VM with pt about their upcoming appt.

## 2024-01-24 ENCOUNTER — Encounter (INDEPENDENT_AMBULATORY_CARE_PROVIDER_SITE_OTHER): Admitting: Primary Care

## 2024-01-30 ENCOUNTER — Ambulatory Visit (INDEPENDENT_AMBULATORY_CARE_PROVIDER_SITE_OTHER): Admitting: Primary Care

## 2024-02-01 ENCOUNTER — Telehealth (INDEPENDENT_AMBULATORY_CARE_PROVIDER_SITE_OTHER): Payer: Self-pay | Admitting: Primary Care

## 2024-02-01 NOTE — Telephone Encounter (Signed)
 Called pt to see if interested in a virtual or rescheduling appt. Please advise

## 2024-04-16 ENCOUNTER — Ambulatory Visit (INDEPENDENT_AMBULATORY_CARE_PROVIDER_SITE_OTHER): Payer: Self-pay | Admitting: Primary Care

## 2024-10-15 ENCOUNTER — Ambulatory Visit: Payer: Self-pay

## 2024-10-15 NOTE — Telephone Encounter (Signed)
 FYI Only or Action Required?: Action required by provider: update on patient condition and please advise if ok to do lab appt or work in.  Patient was last seen in primary care on 01/16/2024 by Celestia Rosaline SQUIBB, NP.  Called Nurse Triage reporting Dysuria.  Symptoms began several days ago.  Interventions attempted: Rest, hydration, or home remedies.  Symptoms are: gradually worsening.  Triage Disposition: See Physician Within 24 Hours  Patient/caregiver understands and will follow disposition?: Yes  Copied from CRM #[id number]. Topic: Clinical - Red Word Triage >> Oct 15, 2024 12:58 PM Winona R wrote: Burning during urination-   Pt called in to schedule physical and std screening, answered no to DT questions. After scheduling pt asked for a sooner appointment for STD screening, I ask again if she was having any symptoms. >> Oct 15, 2024  1:02 PM Winona R wrote: Pt disconnected the call, did not answer when I called back.  Reason for Disposition  Urinating more frequently than usual (i.e., frequency) OR new-onset of the feeling of an urgent need to urinate (i.e., urgency)  Answer Assessment - Initial Assessment Questions 3 days of irritation, burning with urination and urinary frequency. Takes BP meds so not sure if frequency is due to meds or UTI sx.  Denies itching, swelling, urgency or incomplete voiding/dribbling.   No abnormal discharge or smell  Similar feeling to BV as she has in the past Has had same partner for a year  No appt within Dispo- asking if can be seen for lab appt or if can be worked in for Acute visit. Advised may need to be seen in UC in the interim if unable to be worked in. She understands.   1. SYMPTOM: What's the main symptom you're concerned about? (e.g., frequency, incontinence)     Burning with urination  2. ONSET: When did the  burning  start?     3 days  3. PAIN: Is there any pain? If Yes, ask: How bad is it? (Scale: 1-10; mild, moderate,  severe)     8/10 4. CAUSE: What do you think is causing the symptoms?     UTI  5. OTHER SYMPTOMS: Do you have any other symptoms? (e.g., blood in urine, fever, flank pain, pain with urination)     Denies  6. PREGNANCY: Is there any chance you are pregnant? When was your last menstrual period?     Denies  Protocols used: Urinary Symptoms-A-AH

## 2024-10-17 NOTE — Telephone Encounter (Signed)
 Please reach out to pt and schedule an acute visit with any available provider

## 2024-10-18 ENCOUNTER — Telehealth (INDEPENDENT_AMBULATORY_CARE_PROVIDER_SITE_OTHER): Payer: Self-pay | Admitting: Primary Care

## 2024-10-18 NOTE — Telephone Encounter (Signed)
 Called pt to see if interested in sooner appt. Please advise.

## 2025-01-16 ENCOUNTER — Encounter (INDEPENDENT_AMBULATORY_CARE_PROVIDER_SITE_OTHER): Payer: Self-pay | Admitting: Primary Care
